# Patient Record
Sex: Female | Born: 1985
Health system: Southern US, Community
[De-identification: ages and names within clinical notes are randomized; demographics above are authoritative.]

## PROBLEM LIST (undated history)

## (undated) DIAGNOSIS — G8929 Other chronic pain: Secondary | ICD-10-CM

## (undated) DIAGNOSIS — J069 Acute upper respiratory infection, unspecified: Secondary | ICD-10-CM

## (undated) DIAGNOSIS — Z111 Encounter for screening for respiratory tuberculosis: Secondary | ICD-10-CM

## (undated) DIAGNOSIS — K7689 Other specified diseases of liver: Secondary | ICD-10-CM

## (undated) DIAGNOSIS — F4320 Adjustment disorder, unspecified: Secondary | ICD-10-CM

## (undated) DIAGNOSIS — M549 Dorsalgia, unspecified: Secondary | ICD-10-CM

## (undated) DIAGNOSIS — Z8669 Personal history of other diseases of the nervous system and sense organs: Secondary | ICD-10-CM

## (undated) DIAGNOSIS — F339 Major depressive disorder, recurrent, unspecified: Secondary | ICD-10-CM

## (undated) DIAGNOSIS — K122 Cellulitis and abscess of mouth: Secondary | ICD-10-CM

## (undated) DIAGNOSIS — R220 Localized swelling, mass and lump, head: Secondary | ICD-10-CM

## (undated) DIAGNOSIS — L709 Acne, unspecified: Secondary | ICD-10-CM

## (undated) DIAGNOSIS — R221 Localized swelling, mass and lump, neck: Secondary | ICD-10-CM

## (undated) DIAGNOSIS — F419 Anxiety disorder, unspecified: Secondary | ICD-10-CM

## (undated) DIAGNOSIS — F902 Attention-deficit hyperactivity disorder, combined type: Secondary | ICD-10-CM

## (undated) DIAGNOSIS — K219 Gastro-esophageal reflux disease without esophagitis: Secondary | ICD-10-CM

## (undated) DIAGNOSIS — H579 Unspecified disorder of eye and adnexa: Secondary | ICD-10-CM

## (undated) DIAGNOSIS — K589 Irritable bowel syndrome without diarrhea: Secondary | ICD-10-CM

## (undated) DIAGNOSIS — J189 Pneumonia, unspecified organism: Secondary | ICD-10-CM

## (undated) DIAGNOSIS — M359 Systemic involvement of connective tissue, unspecified: Secondary | ICD-10-CM

## (undated) HISTORY — DX: Localized swelling, mass and lump, neck: R22.1

## (undated) HISTORY — DX: Unspecified disorder of eye and adnexa: H57.9

## (undated) HISTORY — DX: Other chronic pain: G89.29

## (undated) HISTORY — DX: Major depressive disorder, recurrent, unspecified: F33.9

## (undated) HISTORY — DX: Personal history of other diseases of the nervous system and sense organs: Z86.69

## (undated) HISTORY — DX: Adjustment disorder, unspecified: F43.20

## (undated) HISTORY — DX: Systemic involvement of connective tissue, unspecified: M35.9

## (undated) HISTORY — DX: Localized swelling, mass and lump, head: R22.0

## (undated) HISTORY — DX: Acute upper respiratory infection, unspecified: J06.9

## (undated) HISTORY — DX: Gastro-esophageal reflux disease without esophagitis: K21.9

## (undated) HISTORY — PX: FOOT SURGERY: SHX648

## (undated) HISTORY — PX: TYMPANOSTOMY TUBE PLACEMENT: SHX32

## (undated) HISTORY — DX: Acne, unspecified: L70.9

## (undated) HISTORY — DX: Encounter for screening for respiratory tuberculosis: Z11.1

## (undated) HISTORY — DX: Irritable bowel syndrome, unspecified: K58.9

## (undated) HISTORY — DX: Dorsalgia, unspecified: M54.9

## (undated) HISTORY — DX: Attention-deficit hyperactivity disorder, combined type: F90.2

## (undated) HISTORY — PX: GANGLION CYST EXCISION: SHX1691

## (undated) HISTORY — PX: MYRINGOTOMY: SUR874

## (undated) HISTORY — PX: SINOSCOPY: SHX187

---
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
## 2006-01-11 ENCOUNTER — Ambulatory Visit: Payer: Self-pay | Admitting: Family Medicine

## 2006-04-22 ENCOUNTER — Emergency Department (HOSPITAL_COMMUNITY): Admission: EM | Admit: 2006-04-22 | Discharge: 2006-04-22 | Payer: Self-pay | Admitting: Family Medicine

## 2006-06-01 ENCOUNTER — Ambulatory Visit: Payer: Self-pay | Admitting: Family Medicine

## 2006-07-20 ENCOUNTER — Ambulatory Visit: Payer: Self-pay | Admitting: Family Medicine

## 2006-07-20 LAB — CONVERTED CEMR LAB: Mono Screen: NEGATIVE

## 2006-08-14 ENCOUNTER — Ambulatory Visit: Payer: Self-pay

## 2006-08-14 ENCOUNTER — Encounter: Payer: Self-pay | Admitting: Cardiology

## 2006-08-14 ENCOUNTER — Ambulatory Visit: Payer: Self-pay | Admitting: Internal Medicine

## 2006-08-28 ENCOUNTER — Ambulatory Visit: Payer: Self-pay | Admitting: Family Medicine

## 2006-09-21 ENCOUNTER — Ambulatory Visit: Payer: Self-pay | Admitting: Family Medicine

## 2007-01-16 ENCOUNTER — Ambulatory Visit: Payer: Self-pay | Admitting: Family Medicine

## 2007-04-19 ENCOUNTER — Emergency Department (HOSPITAL_COMMUNITY): Admission: EM | Admit: 2007-04-19 | Discharge: 2007-04-20 | Payer: Self-pay | Admitting: Emergency Medicine

## 2007-06-20 ENCOUNTER — Ambulatory Visit: Payer: Self-pay | Admitting: Family Medicine

## 2007-06-20 LAB — CONVERTED CEMR LAB: Rapid Strep: NEGATIVE

## 2007-06-21 LAB — CONVERTED CEMR LAB
Basophils Absolute: 0 10*3/uL (ref 0.0–0.1)
Basophils Relative: 0 % (ref 0.0–1.0)
Eosinophils Absolute: 0.1 10*3/uL (ref 0.0–0.6)
Eosinophils Relative: 0.7 % (ref 0.0–5.0)
HCT: 37.2 % (ref 36.0–46.0)
Hemoglobin: 13.1 g/dL (ref 12.0–15.0)
Lymphocytes Relative: 14.4 % (ref 12.0–46.0)
MCHC: 35.1 g/dL (ref 30.0–36.0)
MCV: 87.9 fL (ref 78.0–100.0)
Mono Screen: NEGATIVE
Monocytes Absolute: 0.9 10*3/uL — ABNORMAL HIGH (ref 0.2–0.7)
Monocytes Relative: 7.2 % (ref 3.0–11.0)
Neutro Abs: 9.7 10*3/uL — ABNORMAL HIGH (ref 1.4–7.7)
Neutrophils Relative %: 77.7 % — ABNORMAL HIGH (ref 43.0–77.0)
Platelets: 213 10*3/uL (ref 150–400)
RBC: 4.23 M/uL (ref 3.87–5.11)
RDW: 11.9 % (ref 11.5–14.6)
WBC: 12.5 10*3/uL — ABNORMAL HIGH (ref 4.5–10.5)

## 2007-07-06 ENCOUNTER — Ambulatory Visit: Payer: Self-pay | Admitting: Internal Medicine

## 2007-07-06 ENCOUNTER — Telehealth (INDEPENDENT_AMBULATORY_CARE_PROVIDER_SITE_OTHER): Payer: Self-pay | Admitting: Internal Medicine

## 2007-09-26 ENCOUNTER — Ambulatory Visit: Payer: Self-pay | Admitting: Internal Medicine

## 2007-10-03 DIAGNOSIS — F909 Attention-deficit hyperactivity disorder, unspecified type: Secondary | ICD-10-CM | POA: Insufficient documentation

## 2007-10-03 DIAGNOSIS — Z87898 Personal history of other specified conditions: Secondary | ICD-10-CM | POA: Insufficient documentation

## 2008-01-03 ENCOUNTER — Ambulatory Visit: Payer: Self-pay | Admitting: Internal Medicine

## 2008-01-03 LAB — CONVERTED CEMR LAB
Basophils Relative: 0.8 % (ref 0.0–3.0)
Eosinophils Absolute: 0.1 10*3/uL (ref 0.0–0.7)
Eosinophils Relative: 0.7 % (ref 0.0–5.0)
HCT: 40.5 % (ref 36.0–46.0)
Hemoglobin: 14.1 g/dL (ref 12.0–15.0)
Lymphocytes Relative: 21.1 % (ref 12.0–46.0)
MCHC: 34.8 g/dL (ref 30.0–36.0)
MCV: 90.7 fL (ref 78.0–100.0)
Monocytes Absolute: 0.4 10*3/uL (ref 0.1–1.0)
Monocytes Relative: 5.4 % (ref 3.0–12.0)
Neutro Abs: 5.6 10*3/uL (ref 1.4–7.7)
Neutrophils Relative %: 72 % (ref 43.0–77.0)
Platelets: 257 10*3/uL (ref 150–400)
RBC: 4.47 M/uL (ref 3.87–5.11)
RDW: 12 % (ref 11.5–14.6)
WBC: 7.8 10*3/uL (ref 4.5–10.5)

## 2008-01-14 ENCOUNTER — Ambulatory Visit: Payer: Self-pay

## 2008-01-14 ENCOUNTER — Encounter: Payer: Self-pay | Admitting: Internal Medicine

## 2008-03-21 ENCOUNTER — Ambulatory Visit: Payer: Self-pay | Admitting: Family Medicine

## 2008-05-02 ENCOUNTER — Ambulatory Visit: Payer: Self-pay | Admitting: Internal Medicine

## 2008-05-14 ENCOUNTER — Ambulatory Visit (HOSPITAL_COMMUNITY): Admission: RE | Admit: 2008-05-14 | Discharge: 2008-05-14 | Payer: Self-pay | Admitting: Internal Medicine

## 2008-07-04 ENCOUNTER — Ambulatory Visit: Payer: Self-pay | Admitting: Family Medicine

## 2008-09-05 ENCOUNTER — Encounter: Admission: RE | Admit: 2008-09-05 | Discharge: 2008-09-05 | Payer: Self-pay | Admitting: Family Medicine

## 2008-09-05 ENCOUNTER — Ambulatory Visit: Payer: Self-pay | Admitting: Internal Medicine

## 2008-09-05 DIAGNOSIS — R221 Localized swelling, mass and lump, neck: Secondary | ICD-10-CM

## 2008-09-05 DIAGNOSIS — R220 Localized swelling, mass and lump, head: Secondary | ICD-10-CM | POA: Insufficient documentation

## 2008-09-07 ENCOUNTER — Encounter: Admission: RE | Admit: 2008-09-07 | Discharge: 2008-09-07 | Payer: Self-pay | Admitting: Family Medicine

## 2008-09-09 ENCOUNTER — Telehealth: Payer: Self-pay | Admitting: Family Medicine

## 2008-09-09 LAB — CONVERTED CEMR LAB
BUN: 10 mg/dL (ref 6–23)
Basophils Absolute: 0.1 10*3/uL (ref 0.0–0.1)
Basophils Relative: 0.7 % (ref 0.0–3.0)
CO2: 27 meq/L (ref 19–32)
Calcium: 9.7 mg/dL (ref 8.4–10.5)
Chloride: 107 meq/L (ref 96–112)
Creatinine, Ser: 0.9 mg/dL (ref 0.4–1.2)
Eosinophils Absolute: 0.1 10*3/uL (ref 0.0–0.7)
Eosinophils Relative: 2 % (ref 0.0–5.0)
GFR calc non Af Amer: 83.16 mL/min (ref >60)
Glucose, Bld: 87 mg/dL (ref 70–99)
HCT: 41.7 % (ref 36.0–46.0)
Hemoglobin: 14.7 g/dL (ref 12.0–15.0)
Lymphocytes Relative: 24.5 % (ref 12.0–46.0)
Lymphs Abs: 1.8 10*3/uL (ref 0.7–4.0)
MCHC: 35.1 g/dL (ref 30.0–36.0)
MCV: 87.3 fL (ref 78.0–100.0)
Monocytes Absolute: 0.4 10*3/uL (ref 0.1–1.0)
Monocytes Relative: 5.6 % (ref 3.0–12.0)
Neutro Abs: 4.9 10*3/uL (ref 1.4–7.7)
Neutrophils Relative %: 67.2 % (ref 43.0–77.0)
Platelets: 234 10*3/uL (ref 150.0–400.0)
Potassium: 4.1 meq/L (ref 3.5–5.1)
RBC: 4.78 M/uL (ref 3.87–5.11)
RDW: 12.1 % (ref 11.5–14.6)
WBC: 7.3 10*3/uL (ref 4.5–10.5)

## 2009-03-18 ENCOUNTER — Encounter (INDEPENDENT_AMBULATORY_CARE_PROVIDER_SITE_OTHER): Payer: Self-pay | Admitting: *Deleted

## 2009-04-06 ENCOUNTER — Emergency Department (HOSPITAL_COMMUNITY): Admission: EM | Admit: 2009-04-06 | Discharge: 2009-04-06 | Payer: Self-pay | Admitting: Family Medicine

## 2009-05-08 ENCOUNTER — Ambulatory Visit: Payer: Self-pay | Admitting: Internal Medicine

## 2009-05-08 ENCOUNTER — Encounter (INDEPENDENT_AMBULATORY_CARE_PROVIDER_SITE_OTHER): Payer: Self-pay | Admitting: *Deleted

## 2009-05-08 DIAGNOSIS — R42 Dizziness and giddiness: Secondary | ICD-10-CM | POA: Insufficient documentation

## 2009-05-08 DIAGNOSIS — K589 Irritable bowel syndrome without diarrhea: Secondary | ICD-10-CM | POA: Insufficient documentation

## 2009-06-20 ENCOUNTER — Emergency Department (HOSPITAL_COMMUNITY): Admission: EM | Admit: 2009-06-20 | Discharge: 2009-06-20 | Payer: Self-pay | Admitting: Family Medicine

## 2009-06-20 ENCOUNTER — Ambulatory Visit (HOSPITAL_COMMUNITY): Admission: EM | Admit: 2009-06-20 | Discharge: 2009-06-20 | Payer: Self-pay | Admitting: Orthopaedic Surgery

## 2009-09-02 ENCOUNTER — Ambulatory Visit: Payer: Self-pay | Admitting: Family Medicine

## 2009-09-09 ENCOUNTER — Emergency Department (HOSPITAL_COMMUNITY): Admission: EM | Admit: 2009-09-09 | Discharge: 2009-09-09 | Payer: Self-pay | Admitting: Family Medicine

## 2009-09-09 ENCOUNTER — Telehealth: Payer: Self-pay | Admitting: Family Medicine

## 2009-09-14 ENCOUNTER — Encounter: Payer: Self-pay | Admitting: Family Medicine

## 2009-09-15 ENCOUNTER — Encounter: Admission: RE | Admit: 2009-09-15 | Discharge: 2009-09-15 | Payer: Self-pay | Admitting: General Surgery

## 2009-09-15 ENCOUNTER — Encounter (INDEPENDENT_AMBULATORY_CARE_PROVIDER_SITE_OTHER): Payer: Self-pay | Admitting: *Deleted

## 2009-12-07 ENCOUNTER — Telehealth (INDEPENDENT_AMBULATORY_CARE_PROVIDER_SITE_OTHER): Payer: Self-pay | Admitting: *Deleted

## 2010-02-08 ENCOUNTER — Emergency Department (HOSPITAL_COMMUNITY): Admission: EM | Admit: 2010-02-08 | Discharge: 2010-02-08 | Payer: Self-pay | Admitting: Emergency Medicine

## 2010-05-11 ENCOUNTER — Encounter: Payer: Self-pay | Admitting: Family Medicine

## 2010-05-12 ENCOUNTER — Ambulatory Visit: Payer: Self-pay | Admitting: Internal Medicine

## 2010-05-12 DIAGNOSIS — F331 Major depressive disorder, recurrent, moderate: Secondary | ICD-10-CM | POA: Insufficient documentation

## 2010-05-12 DIAGNOSIS — F329 Major depressive disorder, single episode, unspecified: Secondary | ICD-10-CM

## 2010-07-01 ENCOUNTER — Encounter (INDEPENDENT_AMBULATORY_CARE_PROVIDER_SITE_OTHER): Payer: Self-pay | Admitting: *Deleted

## 2010-07-01 NOTE — Letter (Signed)
Summary: [facility] Note,[facility]  [facility] Note,[facility]   Imported By: Beau Fanny 05/13/2010 09:48:07  _____________________________________________________________________  External Attachment:    Type:   Image     Comment:   External Document

## 2010-07-01 NOTE — Assessment & Plan Note (Signed)
Summary: NECK SWOLLEN/DLO   Vital Signs:  Patient profile:   26 year old female Height:      69 inches Weight:      191.13 pounds BMI:     28.33 Temp:     98.2 degrees F oral Pulse rate:   92 / minute Pulse rhythm:   regular BP sitting:   106 / 70  (left arm) Cuff size:   regular  Vitals Entered By: Delilah Shan CMA Duncan Dull) (September 02, 2009 4:03 PM) CC: Neck swollen   History of Present Illness: 25 yo female with h/o marfans new to me here for follow up of lesion on posterior neck.  Was seen exactly 1 year ago for same lesion, MRI did not show much other ? lipoma. Was referred to surgery but according to pt we called her to tell her to cancel it due to benign findings on MRI.  Since then, it has continued to grow.  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  She denies any radicular symptoms in her upper arms or upper arm weakness.  No other focal neurological symptoms either. Has had more occipital headaches lately as well.  No systemic symptoms such as fevers, night sweats, or weight loss.  MRI from 08/2008 reviewed.    Current Medications (verified): 1)  Alprazolam 1 Mg  Tbdp (Alprazolam) .... Prn 2)  Doxycycline Hyclate 100 Mg Caps (Doxycycline Hyclate) .... Take 1 Capsule By Mouth Two Times A Day  Allergies: 1)  Augmentin 2)  Neomycin  Review of Systems      See HPI General:  Denies chills, fatigue, fever, loss of appetite, and sweats. Neuro:  Complains of headaches; denies numbness, tremors, and weakness.  Physical Exam  General:  Well developed, well nourished, in no acute distress. Neck:  JVP is normal  Palpable soft mass with neck flexion, non tender, soft. Msk:  no acute joint changes Neurologic:  Alert and oriented x 3. Psych:  normally interactive and slightly anxious.     Impression & Recommendations:  Problem # 1:  SWELLING MASS OR LUMP IN HEAD AND NECK  (ICD-784.2) Assessment Deteriorated Will refer to surgery.  She does not want to repeat MRI yet, which I can understand, due to radiation that she is exposed due for work up of her marfan's (cardiac, etc). She did agree to MRI if surgery recommends it. Orders: Surgical Referral (Surgery)  Complete Medication List: 1)  Alprazolam 1 Mg Tbdp (Alprazolam) .... Prn 2)  Doxycycline Hyclate 100 Mg Caps (Doxycycline hyclate) .... Take 1 capsule by mouth two times a day  Patient Instructions: 1)  Please stop by to see Shirlee Limerick on your way out.  Current Allergies (reviewed today): AUGMENTIN NEOMYCIN

## 2010-07-01 NOTE — Progress Notes (Signed)
  Phone Note Call from Patient   Caller: Patient Call For: Judith Part MD Summary of Call: Patient called wanting to be seen this afternoon. I advised her that we had nothing left for the afternoon, but offered her [facility] and she was okay with that.  Initial call taken by: Melody Comas,  December 07, 2009 3:23 PM

## 2010-07-01 NOTE — Assessment & Plan Note (Signed)
Summary: TRANSFER FROM [facility]ILLIE/   Vital Signs:  Patient profile:   25 year old female Weight:      192.25 pounds Temp:     98.5 degrees F oral Pulse rate:   76 / minute Pulse rhythm:   regular BP sitting:   116 / 64  (left arm) Cuff size:   large  Vitals Entered By: Selena Batten Dance CMA Duncan Dull) (May 12, 2010 11:26 AM) CC: Anxiety/stress issues   History of Present Illness: CC: depression, stress.  lots of loss recently - 11yo brother passed away from desmoplastic small round cell cancer - rare cousin to Ewings.  boyfriend broke up with her last week.  mother unstable from recent loss of son.  grandfather died prior.  poor social support - has friends but they all have their own lives.  scehduled to see hospice to talk about grievance counseling Friday.  Then back to counselor next week.  Depression - trouble concentrating/focusing.  + anhedonia.  Increased sleep.  Decreasd energy.  Appetite down.  No HI.  + SI - no plan.  Doesn't think would go through with it 2/2 pain she would cause family.  feels guilty over being the one who awoke mother to tell her that son had passed.  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    recently completed nursing school.  Working at [employer].  [facility] after grandfather died.  previous dx ADHD on vyvanse, off med since finished nursing school.  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 Chest pain Dizziness Depression/Adjustment disorder  Past Surgical History: Ear tubes Left wrist ganglion cyst frenulectomy 25yo  Family History: Father: ETOH abuse,spinal problem Mother: migraines Grandfather: CAD/MI 5v bypass PGP: COPD MAunt: BRCA x2 Siblings: 2 half brothers- one had kidney  removed age 49 month, died of CA               1 half sister  No DM, CVA, colon CA  Social History: Marital Status: single Children: none No tobacco, occasional EtOH, rec drugs Caffeine: 1 coke occasionally occupation: Works at [employer] alone, 1 dog  Review of Systems       per HPI  Physical Exam  General:  Well-developed,well-nourished,in no acute distress; alert,appropriate and cooperative throughout examination Psych:  apporpirate, blunted affect, tears up with discussion of recent stressors/loss.   Impression & Recommendations:  Problem # 1:  DEPRESSION (ICD-311) adjustment disorder with episode of major depression in setting of grieving from recent losses.  received records from counselor Madison Hickman.  Has had poor results with 2 SSRIs (celexa and zoloft) and SNRI (effexor).  + passing SI but no plan, doesn't think would go through.  Contracts for safety.  Will start wellbutrin.  Discussed possible initial worsening of sxs prior to improvement.  If that happens, to call us.  A total of 30 minutes was spent face-to-face with the patient during this encounter and over half of that time was spent on counseling and coordination of care   Her updated medication list for this problem includes:    Alprazolam 1 Mg Tbdp (Alprazolam) ..... One daily as needed for anxiety    Wellbutrin Xl 150 Mg Xr24h-tab (Bupropion hcl) ..... One daily for depression  x 1 wk then increase to 2 daily  Complete Medication List: 1)  Alprazolam 1 Mg Tbdp (Alprazolam) .... One daily as needed for anxiety 2)  Wellbutrin Xl 150 Mg Xr24h-tab (Bupropion hcl) .... One daily for depression x 1 wk then increase to 2 daily  Patient Instructions: 1)  RTC 1 mo for f/u 2)  For mood - start wellbutrin once daily for 1 wk then increase to 2 daily.  SI precautions, call if not doing well on this. 3)  Keep appt with hospice Friday 4)  Keep appt with Bjorn Loser next  week. Prescriptions: WELLBUTRIN XL 150 MG XR24H-TAB (BUPROPION HCL) one daily for depression x 1 wk then increase to 2 daily  #60 x 0   Entered and Authorized by:   Eustaquio Boyden  MD   Signed by:   Eustaquio Boyden  MD on 05/12/2010   Method used:   Electronically to        [facility]* (retail)       [address]       [place], [place]  [address]       Ph: [phone]       Fax: [phone]   RxID:   [id number] ALPRAZOLAM 1 MG  TBDP (ALPRAZOLAM) one daily as needed for anxiety  #30 x 0   Entered and Authorized by:   Eustaquio Boyden  MD   Signed by:   Eustaquio Boyden  MD on 05/12/2010   Method used:   Print then Give to Patient   RxID:   [id number]    Orders Added: 1)  Est. Patient Level IV [64[address]]    Current Allergies (reviewed today): AUGMENTIN * NEOMYCIN OINTMENT DOXYCYCLINE  Appended Document: TRANSFER FROM [facility]ILLIE/ please fax to [facility], attn Lovenia Shuck at [phone]. (ph # [phone])  Appended Document: TRANSFER FROM BILLIE/ Faxed as directed

## 2010-07-01 NOTE — Progress Notes (Signed)
Summary: feels sick, headache  Phone Note Call from Patient Call back at Home Phone [phone]   Caller: Patient Call For: Judith Part MD Summary of Call: Patient sees surgeon for the tumor on her neck monday. She says that last night started feeling terrible with the worst headache she has ever had and feeling very nauseated.  She said that sher is in terrible pain and her neck is swollen. Symptoms have not improved today She wants to know what she should do. Please advise. [facility]. Initial call taken by: Melody Comas,  September 09, 2009 3:48 PM  Follow-up for Phone Call        If she is having all of those symptoms, she needs to be evaluated in the ED.  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 Follow-up by: Ruthe Mannan MD,  September 09, 2009 3:58 PM  Additional Follow-up for Phone Call Additional follow up Details #1::        Called patient back, she said she was on her way to Urgent care.

## 2010-07-01 NOTE — Consult Note (Signed)
Summary: [facility]   Imported By: Lanelle Bal 09/29/2009 13:00:04  _____________________________________________________________________  External Attachment:    Type:   Image     Comment:   External Document

## 2010-07-07 NOTE — Letter (Signed)
Summary: New Patient letter  [facility]  [address] [place], [place] [address]   Phone: [phone]  Fax: [phone]       07/01/2010 MRN: [id number]  [facility] [address] [place], [place]  [address]  Dear Heather Stewart,  Welcome to the Gastroenterology Division at [facility].    You are scheduled to see Dr. Claudette Head on 07-08-2010 at 9:30am on the 3rd floor at [facility], [address]  We ask that you try to arrive at our office 15 minutes prior to your appointment time to allow for check-in.  We would like you to complete the enclosed self-administered evaluation form prior to your visit and bring it with you on the day of your appointment.  We will review it with you.  Also, please bring a complete list of all your medications or, if you prefer, bring the medication bottles and we will list them.  Please bring your insurance card so that we may make a copy of it.  If your insurance requires a referral to see a specialist, please bring your referral form from your primary care physician.  Co-payments are due at the time of your visit and may be paid by cash, check or credit card.     Your office visit will consist of a consult with your physician (includes a physical exam), any laboratory testing he/she may order, scheduling of any necessary diagnostic testing (e.g. x-ray, ultrasound, CT-scan), and scheduling of a procedure (e.g. Endoscopy, Colonoscopy) if required.  Please allow enough time on your schedule to allow for any/all of these possibilities.    If you cannot keep your appointment, please call [phone] to cancel or reschedule prior to your appointment date.  This allows [place] the opportunity to schedule an appointment for another patient in need of care.  If you do not cancel or reschedule by 5 p.m. the business day prior to your appointment date, you will be charged a $50.00 late cancellation/no-show fee.    Thank you for choosing  [facility] for your medical needs.  We appreciate the opportunity to care for you.  Please visit [place] at our website  to learn more about our practice.                     Sincerely,                                                             The Gastroenterology Division

## 2010-07-08 ENCOUNTER — Encounter: Payer: Self-pay | Admitting: Gastroenterology

## 2010-07-08 ENCOUNTER — Ambulatory Visit (INDEPENDENT_AMBULATORY_CARE_PROVIDER_SITE_OTHER): Payer: Managed Care, Other (non HMO) | Admitting: Gastroenterology

## 2010-07-08 ENCOUNTER — Encounter (INDEPENDENT_AMBULATORY_CARE_PROVIDER_SITE_OTHER): Payer: Self-pay | Admitting: *Deleted

## 2010-07-08 ENCOUNTER — Telehealth: Payer: Self-pay | Admitting: Gastroenterology

## 2010-07-08 ENCOUNTER — Other Ambulatory Visit: Payer: Self-pay | Admitting: Gastroenterology

## 2010-07-08 ENCOUNTER — Other Ambulatory Visit: Payer: Managed Care, Other (non HMO)

## 2010-07-08 DIAGNOSIS — K219 Gastro-esophageal reflux disease without esophagitis: Secondary | ICD-10-CM

## 2010-07-08 DIAGNOSIS — R109 Unspecified abdominal pain: Secondary | ICD-10-CM

## 2010-07-08 DIAGNOSIS — IMO0001 Reserved for inherently not codable concepts without codable children: Secondary | ICD-10-CM

## 2010-07-08 DIAGNOSIS — R1013 Epigastric pain: Secondary | ICD-10-CM

## 2010-07-08 LAB — CBC WITH DIFFERENTIAL/PLATELET
Basophils Absolute: 0 10*3/uL (ref 0.0–0.1)
Eosinophils Absolute: 0.1 10*3/uL (ref 0.0–0.7)
Eosinophils Relative: 1.1 % (ref 0.0–5.0)
HCT: 41.4 % (ref 36.0–46.0)
Lymphocytes Relative: 26.4 % (ref 12.0–46.0)
Lymphs Abs: 1.8 10*3/uL (ref 0.7–4.0)
MCHC: 34.3 g/dL (ref 30.0–36.0)
Monocytes Absolute: 0.4 10*3/uL (ref 0.1–1.0)
Monocytes Relative: 6.1 % (ref 3.0–12.0)
Neutro Abs: 4.5 10*3/uL (ref 1.4–7.7)

## 2010-07-08 LAB — BASIC METABOLIC PANEL
Calcium: 9 mg/dL (ref 8.4–10.5)
Chloride: 104 mEq/L (ref 96–112)
Creatinine, Ser: 0.8 mg/dL (ref 0.4–1.2)
GFR: 100.98 mL/min (ref >60.00)
Potassium: 4.2 mEq/L (ref 3.5–5.1)

## 2010-07-08 LAB — TSH: TSH: 2.27 u[IU]/mL (ref 0.35–5.50)

## 2010-07-08 LAB — HEPATIC FUNCTION PANEL
AST: 15 U/L (ref 0–37)
Albumin: 3.9 g/dL (ref 3.5–5.2)
Alkaline Phosphatase: 55 U/L (ref 39–117)
Total Bilirubin: 0.3 mg/dL (ref 0.3–1.2)

## 2010-07-08 LAB — LIPASE: Lipase: 29 U/L (ref 11.0–59.0)

## 2010-07-09 ENCOUNTER — Encounter: Payer: Self-pay | Admitting: Gastroenterology

## 2010-07-09 ENCOUNTER — Telehealth: Payer: Self-pay | Admitting: Gastroenterology

## 2010-07-12 ENCOUNTER — Other Ambulatory Visit: Payer: Self-pay | Admitting: Gastroenterology

## 2010-07-12 ENCOUNTER — Other Ambulatory Visit (AMBULATORY_SURGERY_CENTER): Payer: Managed Care, Other (non HMO) | Admitting: Gastroenterology

## 2010-07-12 DIAGNOSIS — K219 Gastro-esophageal reflux disease without esophagitis: Secondary | ICD-10-CM

## 2010-07-12 DIAGNOSIS — K2990 Gastroduodenitis, unspecified, without bleeding: Secondary | ICD-10-CM

## 2010-07-12 DIAGNOSIS — R1013 Epigastric pain: Secondary | ICD-10-CM

## 2010-07-12 DIAGNOSIS — K2970 Gastritis, unspecified, without bleeding: Secondary | ICD-10-CM

## 2010-07-12 DIAGNOSIS — K319 Disease of stomach and duodenum, unspecified: Secondary | ICD-10-CM

## 2010-07-15 ENCOUNTER — Encounter: Payer: Self-pay | Admitting: Gastroenterology

## 2010-07-15 NOTE — Assessment & Plan Note (Signed)
Summary: nausea, sweats, stabbing pain in stomach...sch w pt cigna-bcb...   History of Present Illness Visit Type: Initial Consult Primary GI MD: Elie Goody MD [facility] Primary Provider: Eustaquio Boyden  MD Requesting Provider: n/a Chief Complaint: Patient c/o 1.5 years occasional sharp epigastric pains with nausea and vomiting. Patient also c/o esophageal burning with these episodes. History of Present Illness:   This is a 25 year old female, who relates a 1.5 year history of intermittent, sharp, severe cramp-like epigastric pains, which are generally associated with nausea and rarely associated with vomiting. Her symptoms have increased in frequency and severity over the past few months and now occur about every 2 weeks. They tend to last for several hours and she is asymptomatic in between attacks. She had an abdominal ultrasound performed in April 2011 that showed fatty infiltration of the liver. She was evaluated by Dr. Bertram Savin in April 2011. She has frequent reflux symptoms occurring once or twice a week. She said under significant stress for the past several months following the death of her 37 year old brother.  She relates mild constipation alternating with normal bowel movements for several years and this pattern has not changed   GI Review of Systems    Reports abdominal pain, acid reflux, bloating, nausea, and  vomiting.     Location of  Abdominal pain: epigastric area.    Denies belching, chest pain, dysphagia with liquids, dysphagia with solids, heartburn, loss of appetite, vomiting blood, weight loss, and  weight gain.      Reports change in bowel habits and  constipation.     Denies anal fissure, black tarry stools, diarrhea, diverticulosis, fecal incontinence, heme positive stool, hemorrhoids, irritable bowel syndrome, jaundice, light color stool, liver problems, rectal bleeding, and  rectal pain. Preventive Screening-Counseling & Management  Alcohol-Tobacco  Smoking Status: never   Current Medications (verified): 1)  Alprazolam 1 Mg  Tbdp (Alprazolam) .... One Daily As Needed For Anxiety  Allergies: 1)  ! * Polymycin 2)  ! * Bacitracin 3)  Augmentin 4)  * Neomycin Ointment 5)  Doxycycline  Past History:  Past Medical History: Reviewed history from 05/12/2010 and no changes required. 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 Chest pain Dizziness Depression/Adjustment disorder  Past Surgical History: Ear tubes Left wrist ganglion cyst frenulectomy 25yo foot surgery-right (glass removed in o.r.)  Family History: Father: ETOH abuse,spinal problem Mother: migraines Grandfather: CAD/MI 5v bypass PGP: COPD MAunt: BRCA x2 Siblings: 2 half brothers- one had kidney removed age 82 month, died of CA               1 half sister No DM, CVA, colon CA Family History of Breast Cancer: Aunts x 3 No FH of Colon Cancer: Family History of Heart Disease: MGF, PGF, Great GM Family History of Kidney Disease: Great Grandmother Family History of Liver Cancer: Brother  Social History: Marital Status: single Children: none occasional EtOH, -4 drinks per week NO rec drugs Caffeine: 1 coke occasionally occupation: Works at [employer] alone, 1 dog Patient has never smoked.   Review of Systems       The patient complains of back pain and depression-new.         The pertinent positives and negatives are noted as above and in the HPI. All other ROS were reviewed and were negative.   Vital Signs:  Patient profile:   25 year old female Height:      69 inches Weight:  189.56 pounds BMI:     28.09 BSA:     2.02 Pulse rate:   60 / minute Pulse rhythm:   regular BP sitting:   110 / 64  (left arm)  Vitals Entered By: Lamona Curl CMA Duncan Dull) (July 08, 2010 9:35 AM)  Physical Exam  General:  Well developed, well nourished, no acute distress. Head:  Normocephalic and atraumatic. Eyes:   PERRLA, no icterus. Ears:  Normal auditory acuity. Mouth:  No deformity or lesions, dentition normal. Lungs:  Clear throughout to auscultation. Heart:  Regular rate and rhythm; no murmurs, rubs,  or bruits. Abdomen:  Soft, nontender and nondistended. No masses, hepatosplenomegaly or hernias noted. Normal bowel sounds. Pulses:  Normal pulses noted. Extremities:  No clubbing, cyanosis, edema or deformities noted. Neurologic:  Alert and  oriented x4;  grossly normal neurologically. Cervical Nodes:  No significant cervical adenopathy. Inguinal Nodes:  No significant inguinal adenopathy. Psych:  Alert and cooperative. Normal mood and affect.  Impression & Recommendations:  Problem # 1:  ABDOMINAL PAIN, EPIGASTRIC (ICD-789.06) Intermittent epigastric pain. Rule out acalculous cholecystitis, GERD, and ulcer disease. Begin omeprazole 40 mg daily, and standard antireflux measures. Schedule CCK HIDA. Schedule EGD. The risks, benefits and alternatives to endoscopy with possible biopsy and possible dilation were discussed with the patient and they consent to proceed. The procedure will be scheduled electively. Orders: TLB-BMP (Basic Metabolic Panel-BMET) (80048-METABOL) TLB-Hepatic/Liver Function Pnl (80076-HEPATIC) TLB-CBC Platelet - w/Differential (85025-CBCD) TLB-TSH (Thyroid Stimulating Hormone) (84443-TSH) TLB-Lipase (83690-LIPASE)  Problem # 2:  GERD (ICD-530.81) Plans as above.  Patient Instructions: 1)  Please go directly to the basement to have your labs drawn.  2)  You have been scheduled for a [facility]. 3)  Upper Endoscopy brochure given.  4)  Pick up your prescription from your pharmacy.  5)  Avoid foods high in acid content ( tomatoes, citrus juices, spicy foods) . Avoid eating within 3 to 4 hours of lying down or before exercising. Do not over eat; try smaller more frequent meals. Elevate head of bed four inches when sleeping.  6)  Copy sent to : Eustaquio Boyden  MD 7)  The  medication list was reviewed and reconciled.  All changed / newly prescribed medications were explained.  A complete medication list was provided to the patient / caregiver.  Prescriptions: OMEPRAZOLE 40 MG CPDR (OMEPRAZOLE) one tablet by mouth once daily  #30 x 11   Entered by:   Christie Nottingham CMA (AAMA)   Authorized by:   Meryl Dare MD [facility]   Signed by:   Christie Nottingham CMA (AAMA) on 07/08/2010   Method used:   Electronically to        [facility]* (retail)       [address]       [place], [place]  [address]       Ph: [phone]       Fax: [phone]   RxID:   [id number]   Appended Document: nausea, sweats, stabbing pain in stomach...sch w pt cigna-bcb... Called [facility] to change the Rx to [facility] [address] per pt's request.    Clinical Lists Changes  Medications: Rx of OMEPRAZOLE 40 MG CPDR (OMEPRAZOLE) one tablet by mouth once daily;  #30 x 11;  Signed;  Entered by: Christie Nottingham CMA (AAMA);  Authorized by: Meryl Dare MD [facility];  Method used: Electronically to [facility] [address], [place], [place], [place]  [address], Ph: [phone], Fax: [phone] Orders: Added  new Test order of EGD (EGD) - Signed Added new Referral order of HIDA CCK (HIDA CCK) - Signed    Prescriptions: OMEPRAZOLE 40 MG CPDR (OMEPRAZOLE) one tablet by mouth once daily  #30 x 11   Entered by:   Christie Nottingham CMA (AAMA)   Authorized by:   Meryl Dare MD [facility]   Signed by:   Christie Nottingham CMA (AAMA) on 07/08/2010   Method used:   Electronically to        [facility] (retail)       [address]       [place], [place]  [address]       Ph: [phone]       Fax: [phone]   RxID:   [id number]

## 2010-07-15 NOTE — Letter (Signed)
Summary: EGD Instructions  [facility]  [address] [place], [place] [address]   Phone: [phone]  Fax: [phone]       Heather Stewart    07-09-1985    MRN: [id number]       Procedure Day /Date: Monday February 13th, 2012     Arrival Time:  10:00am     Procedure Time: 11:00am     Location of Procedure:                    _ x _  Endoscopy Center (4th Floor)    PREPARATION FOR ENDOSCOPY   On 07/12/10 THE DAY OF THE PROCEDURE:  1.   No solid foods, milk or milk products are allowed after midnight the night before your procedure.  2.   Do not drink anything colored red or purple.  Avoid juices with pulp.  No orange juice.  3.  You may drink clear liquids until 9:00am, which is 2 hours before your procedure.                                                                                                CLEAR LIQUIDS INCLUDE: Water Jello Ice Popsicles Tea (sugar ok, no milk/cream) Powdered fruit flavored drinks Coffee (sugar ok, no milk/cream) Gatorade Juice: apple, white grape, white cranberry  Lemonade Clear bullion, consomm, broth Carbonated beverages (any kind) Strained chicken noodle soup Hard Candy   MEDICATION INSTRUCTIONS  Unless otherwise instructed, you should take regular prescription medications with a small sip of water as early as possible the morning of your procedure.        OTHER INSTRUCTIONS  You will need a responsible adult at least 24 years of age to accompany you and drive you home.   This person must remain in the waiting room during your procedure.  Wear loose fitting clothing that is easily removed.  Leave jewelry and other valuables at home.  However, you may wish to bring a book to read or an iPod/MP3 player to listen to music as you wait for your procedure to start.  Remove all body piercing jewelry and leave at home.  Total time from sign-in until discharge is approximately 2-3 hours.  You should go home  directly after your procedure and rest.  You can resume normal activities the day after your procedure.  The day of your procedure you should not:   Drive   Make legal decisions   Operate machinery   Drink alcohol   Return to work  You will receive specific instructions about eating, activities and medications before you leave.    The above instructions have been reviewed and explained to me by   _______________________    I fully understand and can verbalize these instructions _____________________________ Date _________

## 2010-07-15 NOTE — Progress Notes (Signed)
Summary: Hida Scan results   Phone Note Outgoing Call Call back at Home Phone [phone]   Call placed by: Christie Nottingham CMA Duncan Dull),  July 09, 2010 2:12 PM Call placed to: Patient Summary of Call: Hida Scan results recieved from [facility] at [facility] and results reviewed by Dr. Russella Dar. Pt notified of results of Hida Scan and verbalized understanding.  Initial call taken by: Christie Nottingham CMA Duncan Dull),  July 09, 2010 2:13 PM

## 2010-07-15 NOTE — Progress Notes (Signed)
Summary: HIDA Scan   Phone Note Call from Patient Call back at [phone]   Caller: Patient Call For: Dr. Russella Dar Reason for Call: Talk to Nurse Summary of Call: Pt. wants to know if she can have her HIDA scan done at [facility] [facility] [employer] [facility] [employer] where she works they have an opening tomorrow.  Initial call taken by: Karna Christmas,  July 08, 2010 3:23 PM  Follow-up for Phone Call        Pt rescheduled for 07/09/10 at 1:00pm at [facility] med center. Information given to Joey at [facility] to fax results back to and also written on order sheet. Pt given appt date and time. Order faxed to [phone]. Follow-up by: Christie Nottingham CMA Duncan Dull),  July 08, 2010 3:55 PM

## 2010-07-21 NOTE — Procedures (Signed)
Summary: Upper Endoscopy  Patient: Heather Stewart Note: All result statuses are Final unless otherwise noted.  Tests: (1) Upper Endoscopy (EGD)   EGD Upper Endoscopy       DONE     [facility]     [address]     [place], [place]  [address]           ENDOSCOPY PROCEDURE REPORT     PATIENT:  Heather, Stewart  MR#:  [id number]     BIRTHDATE:  1985-10-09, 24 yrs. old  GENDER:  female     ENDOSCOPIST:  Judie Petit T. Russella Dar, MD, [facility]     Referred by:  Eustaquio Boyden, M.D.     PROCEDURE DATE:  07/12/2010     PROCEDURE:  EGD with biopsy, 43239     ASA CLASS:  Class II     INDICATIONS:  epigastric pain, GERD     MEDICATIONS:  Fentanyl 50 mcg IV, Versed 6 mg IV     TOPICAL ANESTHETIC:  Exactacain Spray     DESCRIPTION OF PROCEDURE:   After the risks benefits and     alternatives of the procedure were thoroughly explained, informed     consent was obtained.  The LB GIF-H180 [id number] endoscope was     introduced through the mouth and advanced to the second portion of     the duodenum, without limitations.  The instrument was slowly     withdrawn as the mucosa was fully examined.     <<PROCEDUREIMAGES>>     Moderate gastritis was found in the antrum. It was erosive.     Multiple biopsies were obtained and sent to pathology. Otherwise     normal stomach. The esophagus and gastroesophageal junction were     completely normal in appearance. The duodenal bulb was normal in     appearance, as was the postbulbar duodenum. Retroflexed views     revealed no abnormalities. The scope was then withdrawn from the     patient and the procedure completed.     COMPLICATIONS:  None           ENDOSCOPIC IMPRESSION:     1) Moderate gastritis in the antrum           RECOMMENDATIONS:     1) Await pathology results     2) avoid ASA/NSAIDs     3) continue PPI           Karinne Schmader T. Russella Dar, MD, [facility]           n.     eSIGNED:   Venita Lick. Kianni Lheureux at 07/12/2010 10:58 AM           Tommi Rumps,  [id number]  Note: An exclamation mark (!) indicates a result that was not dispersed into the flowsheet. Document Creation Date: 07/12/2010 10:59 AM _______________________________________________________________________  (1) Order result status: Final Collection or observation date-time: 07/12/2010 10:54 Requested date-time:  Receipt date-time:  Reported date-time:  Referring Physician:   Ordering Physician: Claudette Head [phone]) Specimen Source:  Source: Launa Grill Order Number: [id number] Lab site:

## 2010-07-21 NOTE — Letter (Signed)
Summary: Patient [facility] Biopsy Results  [facility]  [address] [place], [place] [address]   Phone: [phone]  Fax: [phone]        July 15, 2010 MRN: [id number]    Heather Stewart [address] [place], [place]  [address]    Dear Ms. Gladwell,  I am pleased to inform you that the biopsies taken during your recent endoscopic examination did not show any evidence of cancer upon pathologic examination. The biopsies showed a reactive gastritis.  Continue with the treatment plan as outlined on the day of your      exam.  Please call us if you are having persistent problems or have questions about your condition that have not been fully answered at this time.  Sincerely,  Meryl Dare MD [facility]  This letter has been electronically signed by your physician.  Appended Document: Patient Notice-Endo Biopsy Results letter mailed

## 2010-07-23 ENCOUNTER — Inpatient Hospital Stay (HOSPITAL_COMMUNITY): Admission: RE | Admit: 2010-07-23 | Payer: Managed Care, Other (non HMO) | Source: Ambulatory Visit

## 2010-07-30 ENCOUNTER — Telehealth: Payer: Self-pay | Admitting: Family Medicine

## 2010-08-05 NOTE — Progress Notes (Signed)
Summary: refill request for alprazolam  Phone Note Refill Request Message from:  Fax from Pharmacy  Refills Requested: Medication #1:  ALPRAZOLAM 1 MG  TBDP one daily as needed for anxiety   Last Refilled: 05/12/2010 Faxed request from [facility], [phone].   Initial call taken by: Lowella Petties CMA, AAMA,  July 30, 2010 12:50 PM  Follow-up for Phone Call        please call in. Follow-up by: Eustaquio Boyden  MD,  July 30, 2010 1:16 PM  Additional Follow-up for Phone Call Additional follow up Details #1::        Rx called in as directed. Additional Follow-up by: Janee Morn CMA Duncan Dull),  July 30, 2010 3:10 PM    Prescriptions: ALPRAZOLAM 1 MG  TBDP (ALPRAZOLAM) one daily as needed for anxiety  #30 x 0   Entered and Authorized by:   Eustaquio Boyden  MD   Signed by:   Eustaquio Boyden  MD on 07/30/2010   Method used:   Telephoned to ...       [facility]       [address]       [place], [place]  [address]       Ph: [phone]       Fax: [phone]   RxID:   [id number]

## 2010-08-12 LAB — POCT URINALYSIS DIPSTICK
Bilirubin Urine: NEGATIVE
Glucose, UA: NEGATIVE mg/dL
Ketones, ur: NEGATIVE mg/dL
Nitrite: NEGATIVE
Protein, ur: NEGATIVE mg/dL
pH: 5.5 (ref 5.0–8.0)

## 2010-08-12 LAB — POCT PREGNANCY, URINE: Preg Test, Ur: NEGATIVE

## 2010-08-18 LAB — POCT URINALYSIS DIP (DEVICE)
Ketones, ur: NEGATIVE mg/dL
Nitrite: NEGATIVE

## 2010-10-12 NOTE — Assessment & Plan Note (Signed)
[facility]                            CARDIOLOGY OFFICE NOTE   JOLENE, GUYETT                      MRN:          [id number]  DATE:05/02/2008                            DOB:          November 16, 1985    IDENTIFICATION:  Ms. Bertling is a 25 year old with a history of probable  Marfan's, mild POTS, and ADHD.  I last saw her in clinic back in August.   In the interval, she notes she has had a few episodes of chest pain, one  occurred when she was at dinner about 3 weeks ago like a knife in her  chest, lasted about an hour, took an aspirin, and it eased off.  Did not  note if it was pleuritic as she felt she could not get the breath.  The  next day, again, she had a spell that lasted about 30-40 minutes, eased  off.  This week, actually she had a spell on Tuesday while driving,  lasted about 30 minutes, again does not appear to be positional, not  pleuritic, not radiating, and is not accompanied by shortness of breath.   Otherwise, she is very active.  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   CURRENT MEDICINES:  1. Vyvanse 40.  2. Zoloft 150.  3. Metoprolol 25.   PHYSICAL EXAMINATION:  General:  On exam, the patient is in no distress  at rest.  VITAL SIGNS:  Blood pressure is 124/80 and pulse 80.  Orthostatics:  Blood pressure lying 124/88, pulse 80; sitting 119/81, pulse 89, a  little dizzy;  standing 115/77, pulse 95, a little dizzy; standing at 2  minutes 120/80, pulse 100, okay; and at 5 minutes, 122/86,  pulse 97,  the patient okay.  Had a slight headache.  LUNGS:  Clear.  CARDIAC:  Regular rate and rhythm.  S1 and S2.  No S3.  No murmurs.  ABDOMEN:  Benign.  Mild striae on abdomen and chest.  EXTREMITIES:  No edema.   A 12-lead EKG shows normal sinus rhythm with 67 beats per minute.  Nonspecific RV conduction delay.   IMPRESSION:  1. Chest pain.  It is  concerning, I not convinced it is cardiac, but I      think we need to evaluate her aorta.  Her echocardiogram in August,      the sinuses of Valsalva were at 35 mm, ascending aorta was 28.  I      would continue on the beta-blocker but increase.  She had some      problems of shortness breath on atenolol.  With the Toprol, I have      asked her to take 1-1/2 tablets daily.  Use of Vyvanse is not optimal, but this is the only way she can  concentrate.  Again, I will be in touch with her and discuss after I  have seen the MRI.  This of course she gets a spell that is severe, she  needs to go to the emergency room.  1.  Attention deficit hyperactivity disorder as noted.  2. Question autonomic dysfunction.  I think the patient does have a      mild form of dysautonomia with POTS.  She does not meet full      criteria today.  I told her to stay adequately hydrated and taking      extra salt.   I will be in touch with the patient once I have seen the test results.     Pricilla Riffle, MD, [facility]  Electronically Signed    PVR/MedQ  DD: 05/02/2008  DT: 05/03/2008  Job #: [id number]

## 2010-10-12 NOTE — Assessment & Plan Note (Signed)
[facility]                            CARDIOLOGY OFFICE NOTE   Heather Stewart, Heather Stewart                      MRN:          [id number]  DATE:01/03/2008                            DOB:          [birth year]    IDENTIFICATION:  Heather Stewart is a 25 year old woman.  I have followed  her in clinic.  She has a history of probable Marfan syndrome (history  of dislocated lens, skeletal features consistent with Marfan's).  She  was followed by Dr. Lorna Few in the past.  I last saw her back in  February 2009.   In February, she was placed on Vyvanse for her ADHD.  Since then, she  has continued on this and actually the dose was increased to 40 mg per  day.  She was also placed on Zoloft.  She says on this, she is thinking  a lot more clearly.   This weekend, she had abdominal plain.  Her gynecologist felt she had an  ovarian cyst that ruptured.  She is due to follow up there in a couple  weeks.  Currently, she denies dizziness.  Says she is drinking adequate  fluids.   CURRENT MEDICATIONS:  1. Vyvanse 40 mg.  2. Zoloft 150.  3. Birth control pills.   PHYSICAL EXAM:  GENERAL:  The patient is in no distress at rest.  VITAL SIGNS:  Blood pressure 111/71 resting, pulse 97, sitting 118/78,  pulse 103, standing at 0-minutes 114/79, pulse 115, at 2 minutes 114/76,  pulse 117, at 5 minutes 123/79, pulse 127.  The patient asymptomatic  throughout.  LUNGS:  Clear.  CARDIAC:  Regular rate and rhythm.  S1 and S2.  No S3.  No significant  murmurs.  ABDOMEN:  Benign.  EXTREMITIES:  No edema.  Good pulses.   A 12-lead EKG, sinus tachycardia 116 beats per minute.   IMPRESSION:  Probable Marfan syndrome.  I have been following the aortic  root.  Last echocardiogram, her root was 32.5 mm.  I am concerned about  her Vyvanse.  With increased heart rate, she meets criteria for pots on  exam, but she denies dizziness and says she has been drinking  adequately.  Urine  during the day is clear.  I need to get in touch with  Dr. Gunnar Fusi at [facility] about the pulse and I have told the  patient I will be in touch with her.   For now, we will schedule her for another followup echo at her  convenience.    Pricilla Riffle, MD, [facility]  Electronically Signed   PVR/MedQ  DD: 01/03/2008  DT: 01/04/2008  Job #: [id number]

## 2010-10-12 NOTE — Assessment & Plan Note (Signed)
[facility]                            CARDIOLOGY OFFICE NOTE   MILIANNA, ERICSSON                      MRN:          [id number]  DATE:07/06/2007                            DOB:          27-Feb-1986    IDENTIFICATION:  The patient is a 25 year old woman with history of  probable Marfan's syndrome (dislocated lens, some skeletal features  consistent with Marfan's).  She had previously been followed by Lorna Few.  I saw her back in March.   She had called in to the clinic saying she was seen in psychiatry and  was placed on medicine for ADHD and told to come in to get evaluated.   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  She has been  started on sertraline as well as well as Vyvanse 20 mg for depression  and ADHD.  She said without Vyvanse she just cannot concentrate and her  grades are abysmal.   Since she was seen in March, the patient has had a few episodes of near  syncope.  She felt hot, sweaty and things started going black.  She  never really passed out.  She was standing and at school at that time.  She has denied any palpitations associated with a spell, denies  dizziness at other times.  It does not sound like she was drinking much  around the time that these happened.   CURRENT MEDICATIONS:  Sertraline HL 50 50, Vyvanse 20.   PHYSICAL EXAM:  The patient is in no distress.  Blood pressure  orthostatic, sitting blood pressure 117/54 pulse 54, standing at 0-  minute blood pressure 117/75 pulse 83, at 2 minutes 111/73 pulse 79 and  at  5 minutes 123/76 pulse 78.  The patient asymptomatic throughout  this.  LUNGS were clear.  CARDIAC exam regular rate and rhythm, S1-S2 no S3-S4,  murmurs or  definite clicks.  ABDOMEN is benign.  EXTREMITIES:  No edema.   IMPRESSION:  Ms. Eberle is a 25 year old woman with possible Marfan's.  Note, her last echocardiogram was done in March.  LV function was  normal.  The aortic root  was normal in size at 32.5 mm.  There is a  suggestion of prolapse but not confirmed.   1. The patient now is being evaluated by [facility], Dr. Lance Coon,      and is being treated for depression and ADHD with the above agents.      Overall I think my goal is to limit the doses of the Vyvanse-like      agents if possible to efficacy.  I will be in touch with Dr. Rickey Primus      regarding this.  2. Presyncope.  In regards to the patient's episodes of presyncope it      sounds like she has not gotten enough in, plus she may have been a      little hypervagal at the time.  Indeed today she has evidence of      mild orthostasis with increase in her pulse rate from 52-78.  Blood  pressure does increase actually with standing consistent with some      hyperadrenergic tone.  I have discussed this with her and      encouraged to increase her fluid intake throughout the day.   I will be in touch with the patient once I have talked to Dr. Rob Bunting.  Continue on the medicines for now, activities as tolerated.     Pricilla Riffle, MD, [facility]  Electronically Signed    PVR/MedQ  DD: 07/19/2007  DT: 07/20/2007  Job #: [id number]

## 2010-10-13 ENCOUNTER — Telehealth: Payer: Self-pay | Admitting: *Deleted

## 2010-10-13 MED ORDER — AZITHROMYCIN 250 MG PO TABS: ORAL_TABLET | ORAL | Status: DC

## 2010-10-13 NOTE — Telephone Encounter (Signed)
Was sister diagnosed with nasal swab?   Pt is up to date on tetanus vaccine.  Reassuring. However we do have option of treating.  Will send in zpack if patient desires for prophylaxis, would be able to continue to work. Otherwise may monitor, but if any upper resp sxs/cough, would need to stay out of work until completes treatment. Let me know if any questions.

## 2010-10-13 NOTE — Telephone Encounter (Signed)
Pt's sister has been dx'd with pertussis and pt is asking if she should be treated.  She went to the beach with her, rode in the car with her, when she had the cough.  Her sister's doctor has recommended that several family members be treated.  Please advise on what you think.

## 2010-10-14 ENCOUNTER — Other Ambulatory Visit: Payer: Self-pay | Admitting: *Deleted

## 2010-10-14 MED ORDER — AZITHROMYCIN 250 MG PO TABS: ORAL_TABLET | ORAL | Status: AC

## 2010-10-14 MED ORDER — AZITHROMYCIN 250 MG PO TABS: ORAL_TABLET | ORAL | Status: DC

## 2010-10-14 NOTE — Telephone Encounter (Signed)
Rx printed in error. Re-ordered and sent in electronically.

## 2010-10-14 NOTE — Telephone Encounter (Signed)
She wasn't sure how her sister was diagnosed. Her mother just called and said that's what the pediatrician said she had and that everyone needed to be treated with prophylactic abx. Advised patient as long as she wasn't coughing or with any upper resp symptoms, then she could continue to work. If she developed symptoms, then she needed to remain out of work until abx course was completed. She verbalized understanding and abx were sent to [facility] per patient request.

## 2010-10-15 NOTE — Op Note (Signed)
[facility]  Patient:    Heather Stewart, SHANE Visit Number: [id number] MRN: [id number]          Service Type: Attending:  Nicki Reaper, M.D. Dictated by:   Nicki Reaper, M.D. Proc. Date: 05/18/01                             Operative Report  PREOPERATIVE DIAGNOSIS:  Mass, left wrist.  POSTOPERATIVE DIAGNOSIS:  Mass, left wrist.  OPERATION:  Excision mass, left wrist.  SURGEON:   Nicki Reaper, M.D.  ASSISTANT:  None.  ANESTHESIA:  Upper arm IV regional.  ANESTHESIOLOGIST:  Janetta Hora. Gelene Mink, M.D.  HISTORY:  The patient is a 25 year old female with a history of a recurrent mass of the dorsal aspect of her left hand which has not responded to conservative treatment.  PROCEDURE:  The patient was brought to the operating room where an IV regional anesthetic was carried out without difficulty.  She was prepped and draped using Betadine scrub and solution with the left arm free.  A transverse incision was made over the mass, carried down through subcutaneous tissue. Bleeders were electrocauterized, neurovascular structures protected.  The mass was immediately encountered.  This was a markedly thick mass covering the entire dorsum of the scapholunate area.  With blunt and sharp dissection this was dissected free, was found to have an involuted area to it, appearing to be a large cyst within a cyst.  The entire area was excised.  The capsule was then closed after irrigation of the wound with figure-of-eight 4-0 Vicryl sutures.  The retinaculum was closed with interrupted 4-0 Vicryl, the skin with a subcuticular 4-0 Monocryl sutures.  Steri-Strips were applied, sterile compressive dressing and splint were applied.  The patient tolerated the procedure well and was taken to the recovery room for observation in satisfactory condition.  DISPOSITION:  She is discharged home to return to [facility] [place] in one week on Vicodin and  Keflex. Dictated by:   Nicki Reaper, M.D. Attending:  Nicki Reaper, M.D. DD:  05/18/01 TD:  05/19/01 Job: [id number]

## 2010-10-15 NOTE — Assessment & Plan Note (Signed)
[facility]                            CARDIOLOGY OFFICE NOTE   TRENAE, BRUNKE                      MRN:          [id number]  DATE:08/14/2006                            DOB:          1985/12/27    Ms. Gadsby is a 25 year old woman (birthday 02-06-1986) who has a  history of probable Marfan's.  History of a dislocated lens, some  skeletal features consistent with Marfan.  She was followed by Lorna Few in the past.  I last saw her back in 2006.  The patient was taken  off atenolol therapy.  She had been erratic in it's use before.   She comes in for followup today.  She says, over the past couple of  months, she has had episodes of shortness of breath and some chest  heaviness.  It occurs every day, more in the evening.  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  She will go to sleep, and she will wake up and the pain  will be gone.  Mother also says she complains of shortness of breath,  when she has gone out with her friends as night.  She does not smoke,  denies wheezing.   CURRENT MEDICATIONS:  None.   REVIEW OF SYSTEMS:  The patient is trying to get into nursing school at  [employer].  She also has a part-time job.  She is having problems focusing at  school, has been on Ritalin in the past.  Last time she took it, her  heart was racing.   PHYSICAL EXAMINATION:  GENERAL:  On exam, the patient is in no distress,  blood pressure 114/79.  Pulse is 67, weight 174.  NECK:  JVP is normal, no thyromegaly.  LUNGS:  Clear.  No wheezes or rales.  CHEST:  Nontender.  CARDIAC:  Regular rate and rhythm, S1, S2, no S3, S4 or murmurs.  ABDOMEN:  Benign.  EXTREMITIES:  No edema.  Good pulses throughout.   A 12-lead EKG:  Normal sinus rhythm, 67 beats per minute, non-specific  ST changes.   IMPRESSION:  Ms. Bracher is a 25 year old girl with possible Marfan's.  I reviewed her  electrocardiogram today, and indeed her echo is normal.  The aortic root has not changed significantly.  It is about 30 to 31 mm.  Ascending aorta and arch are normal.  LV function is normal.   I am not sure what these spells represent.  Again, with a normal and no  change from previous, I do not think it represents a significant issue  concerning for dissection or coronary insufficiency.  Question if she  has some mild reactive airway disease and will set her up for Pulmonary  function tests, hopefully this week.  Again, I told her to take  activities as tolerated.  Her mother thinks some anxiety component may  be in this, with the patient's increased stress.   Tentatively, I have not set a followup but will be in touch with  patient.     Sherol Dade  Tenny Craw, MD, [facility]  Electronically Signed    PVR/MedQ  DD: 08/14/2006  DT: 08/15/2006  Job #: [id number]

## 2010-10-19 ENCOUNTER — Encounter: Payer: Self-pay | Admitting: Family Medicine

## 2010-10-19 ENCOUNTER — Ambulatory Visit (HOSPITAL_COMMUNITY)
Admission: RE | Admit: 2010-10-19 | Payer: Managed Care, Other (non HMO) | Source: Home / Self Care | Admitting: Psychiatry

## 2010-10-20 ENCOUNTER — Ambulatory Visit: Payer: Managed Care, Other (non HMO) | Admitting: Family Medicine

## 2011-02-07 ENCOUNTER — Telehealth: Payer: Self-pay | Admitting: Internal Medicine

## 2011-02-07 DIAGNOSIS — Q8740 Marfan's syndrome, unspecified: Secondary | ICD-10-CM

## 2011-02-07 NOTE — Telephone Encounter (Signed)
Will send order to [facility] and ask for appointment with Dr.Ross

## 2011-02-07 NOTE — Telephone Encounter (Signed)
Pt would like to be set up for echo / ekg.

## 2011-02-08 ENCOUNTER — Telehealth: Payer: Self-pay | Admitting: Internal Medicine

## 2011-02-08 ENCOUNTER — Other Ambulatory Visit: Payer: Self-pay | Admitting: *Deleted

## 2011-02-08 DIAGNOSIS — Q8740 Marfan's syndrome, unspecified: Secondary | ICD-10-CM

## 2011-02-08 NOTE — Telephone Encounter (Signed)
Pt calling to schedule ECHO and EKG. Pt said she gets both done yearly and wanted to go ahead and schedule them before next year. Please return pt call to discuss further.

## 2011-02-08 NOTE — Telephone Encounter (Signed)
ORDER ENTERED FOR ECHO AND STAFF MESSAGE WAS SENT TO [facility]  TO CALL PT AND SCHEDULE YEARLY APPT  .Heather Stewart

## 2011-02-16 ENCOUNTER — Encounter: Payer: Self-pay | Admitting: *Deleted

## 2011-02-24 ENCOUNTER — Encounter: Payer: Self-pay | Admitting: Internal Medicine

## 2011-02-25 ENCOUNTER — Ambulatory Visit (INDEPENDENT_AMBULATORY_CARE_PROVIDER_SITE_OTHER): Payer: Managed Care, Other (non HMO) | Admitting: Internal Medicine

## 2011-02-25 ENCOUNTER — Ambulatory Visit (HOSPITAL_COMMUNITY): Payer: Managed Care, Other (non HMO) | Attending: Internal Medicine | Admitting: Radiology

## 2011-02-25 DIAGNOSIS — M359 Systemic involvement of connective tissue, unspecified: Secondary | ICD-10-CM

## 2011-02-25 DIAGNOSIS — I359 Nonrheumatic aortic valve disorder, unspecified: Secondary | ICD-10-CM | POA: Insufficient documentation

## 2011-02-25 DIAGNOSIS — Q8740 Marfan's syndrome, unspecified: Secondary | ICD-10-CM | POA: Insufficient documentation

## 2011-02-25 DIAGNOSIS — Z136 Encounter for screening for cardiovascular disorders: Secondary | ICD-10-CM

## 2011-02-25 DIAGNOSIS — Z0000 Encounter for general adult medical examination without abnormal findings: Secondary | ICD-10-CM

## 2011-02-25 DIAGNOSIS — R42 Dizziness and giddiness: Secondary | ICD-10-CM | POA: Insufficient documentation

## 2011-02-25 DIAGNOSIS — I079 Rheumatic tricuspid valve disease, unspecified: Secondary | ICD-10-CM | POA: Insufficient documentation

## 2011-02-25 LAB — LIPID PANEL
Cholesterol: 147 mg/dL (ref 0–200)
HDL: 32.2 mg/dL — ABNORMAL LOW (ref >39.00)
LDL Cholesterol: 99 mg/dL (ref 0–99)
VLDL: 15.8 mg/dL (ref 0.0–40.0)

## 2011-02-25 NOTE — Patient Instructions (Signed)
Lipid Panel. Will call you with results.  Your physician wants you to follow-up in 2 years. You will receive a reminder letter in the mail two months in advance. If you don't receive a letter, please call our office to schedule the follow-up appointment.

## 2011-03-08 ENCOUNTER — Telehealth: Payer: Self-pay | Admitting: Internal Medicine

## 2011-03-08 DIAGNOSIS — M359 Systemic involvement of connective tissue, unspecified: Secondary | ICD-10-CM | POA: Insufficient documentation

## 2011-03-08 DIAGNOSIS — Z0000 Encounter for general adult medical examination without abnormal findings: Secondary | ICD-10-CM | POA: Insufficient documentation

## 2011-03-08 DIAGNOSIS — Z0001 Encounter for general adult medical examination with abnormal findings: Secondary | ICD-10-CM | POA: Insufficient documentation

## 2011-03-08 NOTE — Assessment & Plan Note (Signed)
Will need to check fasting lipids.  Encouraged her to watch patient sizes.  Encouraged her to stay active.

## 2011-03-08 NOTE — Assessment & Plan Note (Signed)
Patient had an echo today that was normal.  Aorta was normal  No valve pathology.  Normal LV function She shows no signs of cardiac involvement. I think she should be followed periodically bu think she should do well.  No restrictions.

## 2011-03-08 NOTE — Progress Notes (Signed)
HPI Heather Stewart is a 25 year old with a history of some connective tissue disease.  She was followed as child by Heather Stewart.  I continued to see her and last saw her in 2010. She has had a dislocated lens.  Also striae.  No signif cardiac or aortic abnormalities. I saw her last in 2010.  Since then she has done OK from a cardiac standpoint.  Active.  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  No palpitations.  No SOB. Difficult year since her younger brother died of cancer.  Allergies  Allergen Reactions  . Bacitracin     REACTION: rash  . Doxycycline     REACTION: burning in stomache  . ONG:EXBMWUXLKGM+WNUUVOZDG+UYQIHKVQQV Acid+Aspartame     REACTION: diarrhea  . Neomycin     REACTION: rash    Current Outpatient Prescriptions  Medication Sig Dispense Refill  . ALPRAZolam (XANAX) 1 MG tablet Take 1 mg by mouth daily as needed.          Past Medical History  Diagnosis Date  . Connective tissue disease     probable Marfans variant  . ADHD (attention deficit hyperactivity disorder)   . Depression   . Adjustment disorder   . Dizziness and giddiness   . Esophageal reflux   . Irritable bowel syndrome   . Hx of migraines   . Screening examination for pulmonary tuberculosis   . Swelling, mass, or lump in head and neck     Past Surgical History  Procedure Date  . Myringotomy   . Ganglion cyst excision     Left wrist  . Frenulectomy, lingual 2001  . Foot surgery     Right-glass removed in OR    Family History  Problem Relation Age of Onset  . Alcohol abuse Father   . Other Father     Spinal problem  . Migraines Mother   . Coronary artery disease      GF 5v bypass  . COPD Paternal Grandfather   . Breast cancer Maternal Aunt     x2  . Cancer Brother     HALF brother (had kidney removed at 61 months-then died of cancer)  . Breast cancer      Aunts x3  . Heart disease      MGF, PGF, GGM  . Kidney disease      GGM  . Liver cancer Brother     History   Social History    . Marital Status: Single    Spouse Name: N/A    Number of Children: N/A  . Years of Education: N/A   Occupational History  . [employer]    Social History Main Topics  . Smoking status: Never Smoker   . Smokeless tobacco: Not on file  . Alcohol Use: Yes     less than 4/week  . Drug Use: No  . Sexually Active: Not on file   Other Topics Concern  . 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  They are negative to the above problem except as previously stated.  Vital Signs: BP 111/71  Pulse 62  Ht 5\' 10"  (1.778 m)  Wt 190 lb (86.183 kg)  BMI 27.26 kg/m2  Physical Exam Heather Stewart is in NAD HEENT:  Normocephalic, atraumatic. EOMI, PERRLA.  Neck: JVP is normal. No thyromegaly. No bruits.  Lungs: clear to auscultation. No rales no wheezes.  Heart: Regular rate and rhythm. Normal S1, S2. No S3.   No significant murmurs. PMI not displaced.  Abdomen:  Supple, nontender. Normal bowel sounds. No masses. No hepatomegaly.  Extremities:   Good distal pulses throughout. No lower extremity edema.  Musculoskeletal :moving all extremities.  Neuro:   alert and oriented x3.  CN II-XII grossly intact. Skin:  Mild striae. EKG:  Sinus bradycardia.  57 bpm.   Assessment and Plan:

## 2011-03-08 NOTE — Telephone Encounter (Signed)
Pt calling re letter from dr Tenny Craw to get life insurance calling re status

## 2011-03-08 NOTE — Telephone Encounter (Signed)
Called wanting to know the status of letter Dr. Tenny Craw was going to write stating that she did not have Marfan's so she could get life insurance. Dr. Tenny Craw spoke w/her on OV 9/28 stating she was going to call [facility] to see how to write letter. Advised Dr. Tenny Craw and her nurse Annice Pih would not be back in office until Thursday and would have them check on the letter and call her Thursday.

## 2011-03-30 NOTE — Telephone Encounter (Signed)
Pt has called three times Pt is calling back again about letter to get life insurance. Please call her back

## 2011-03-30 NOTE — Telephone Encounter (Signed)
Please address

## 2011-04-01 ENCOUNTER — Encounter: Payer: Self-pay | Admitting: Internal Medicine

## 2011-04-01 NOTE — Telephone Encounter (Signed)
Called patient. Letter done by Dr.Ross. Mailed to her home address.

## 2011-05-30 ENCOUNTER — Telehealth: Payer: Self-pay | Admitting: Gastroenterology

## 2011-05-30 DIAGNOSIS — R140 Abdominal distension (gaseous): Secondary | ICD-10-CM

## 2011-05-30 NOTE — Telephone Encounter (Signed)
Pt reports she would like the blood testing for Gluten intolerance. She reports noticing problems on different occasions where she will have bloating and abdominal pain. She reports drinking a few beers the other night and had terrible pain and bloating. I asked if she ate much bread/wheat and she stated no. She wonders if she has IBS or a Gluten problem. Pt saw Dr Russella Dar 07/08/10 for epigastric pain, n?V and esophageal burning. She was ordered Omeprazole, a Hida Scan and EGD. She cancelled the HIDA and the EGD showed reactive Gastropathy. Dr Russella Dar, can we order Celiac panel for pt? Pt understands Dr Russella Dar is off until next week.

## 2011-05-31 NOTE — Telephone Encounter (Signed)
Can send a celiac panel including IgA

## 2011-06-01 NOTE — Telephone Encounter (Signed)
lmom for pt that Dr Russella Dar ordered the Celiac Panel for her and left lab times.

## 2011-06-02 ENCOUNTER — Other Ambulatory Visit: Payer: Managed Care, Other (non HMO)

## 2011-06-02 DIAGNOSIS — R140 Abdominal distension (gaseous): Secondary | ICD-10-CM

## 2011-06-03 LAB — CELIAC PANEL 10
Gliadin IgA: 2 U/mL (ref <20)
IgA: 173 mg/dL (ref 69–380)
Tissue Transglut Ab: 4.2 U/mL (ref <20)

## 2011-06-13 ENCOUNTER — Ambulatory Visit: Payer: Managed Care, Other (non HMO) | Admitting: Gastroenterology

## 2011-06-21 ENCOUNTER — Ambulatory Visit (INDEPENDENT_AMBULATORY_CARE_PROVIDER_SITE_OTHER): Payer: Managed Care, Other (non HMO) | Admitting: Gastroenterology

## 2011-06-21 ENCOUNTER — Encounter: Payer: Self-pay | Admitting: Gastroenterology

## 2011-06-21 VITALS — BP 110/70 | HR 75 | Ht 70.0 in | Wt 194.6 lb

## 2011-06-21 DIAGNOSIS — R109 Unspecified abdominal pain: Secondary | ICD-10-CM

## 2011-06-21 DIAGNOSIS — R143 Flatulence: Secondary | ICD-10-CM

## 2011-06-21 DIAGNOSIS — R141 Gas pain: Secondary | ICD-10-CM

## 2011-06-21 DIAGNOSIS — R142 Eructation: Secondary | ICD-10-CM

## 2011-06-21 MED ORDER — ALIGN 4 MG PO CAPS: 1.0000 | ORAL_CAPSULE | Freq: Every day | ORAL | Status: DC

## 2011-06-21 MED ORDER — GLYCOPYRROLATE 1 MG PO TABS: 1.0000 mg | ORAL_TABLET | Freq: Two times a day (BID) | ORAL | Status: DC

## 2011-06-21 MED ORDER — OMEPRAZOLE 20 MG PO CPDR: 20.0000 mg | DELAYED_RELEASE_CAPSULE | Freq: Every day | ORAL | Status: DC

## 2011-06-21 NOTE — Progress Notes (Signed)
History of Present Illness: This is a 26 year old female who complains of nausea, intestinal gas, flatulence, bloating and lower abdominal pain that is associated with all the above symptoms. She has occasional episodes of diarrhea and occasional constipation. A recent celiac panel was negative. Upper endoscopy performed one year ago showed gastritis. Denies weight loss,change in stool caliber, melena, hematochezia, vomiting, dysphagia, reflux symptoms, chest pain.  Current Medications, Allergies, Past Medical History, Past Surgical History, Family History and Social History were reviewed in [employer] record.  Physical Exam: General: Well developed , well nourished, no acute distress Head: Normocephalic and atraumatic Eyes:  sclerae anicteric, EOMI Ears: Normal auditory acuity Mouth: No deformity or lesions Lungs: Clear throughout to auscultation Heart: Regular rate and rhythm; no murmurs, rubs or bruits Abdomen: Soft, non tender and non distended. No masses, hepatosplenomegaly or hernias noted. Normal Bowel sounds Musculoskeletal: Symmetrical with no gross deformities  Pulses:  Normal pulses noted Extremities: No clubbing, cyanosis, edema or deformities noted Neurological: Alert oriented x 4, grossly nonfocal Psychological:  Alert and cooperative. Normal mood and affect  Assessment and Recommendations:  1. Gas, bloating, nausea and lower abdominal pain.Begin a low gas diet, Gas-X 3 times a day, omeprazole 20 mg daily and a trial of probiotics. If her symptoms do not respond consider a trial of Xifaxan. Return office visit 6 weeks.

## 2011-06-21 NOTE — Patient Instructions (Signed)
Your prescriptions for glycopyrrolate and omeprazole have been sent to your pharmacy.  Start Align samples one tablet by mouth once daily x 2 weeks. If this probiotic does not help then try Florastor or Restora over the counter once daily x 2 weeks.  Low gas diet given. Use Gas-x three times a day as needed for gas relief.  cc: Eustaquio Boyden, MD

## 2011-08-29 DIAGNOSIS — H579 Unspecified disorder of eye and adnexa: Secondary | ICD-10-CM

## 2011-08-29 HISTORY — DX: Unspecified disorder of eye and adnexa: H57.9

## 2011-08-30 ENCOUNTER — Telehealth: Payer: Self-pay | Admitting: Internal Medicine

## 2011-08-30 NOTE — Telephone Encounter (Signed)
Left message

## 2011-08-30 NOTE — Telephone Encounter (Signed)
Discussed with Lawson Fiscal and she reviewed patients chart.  Echo normal Sept. 2012, patient needs to contact PCP  Chest pain with some nausea.  Chest pain worse with takes a deep breath and radiates to back. Pain was so bad Friday she felt she needed to go to emergency room but didn't, not as bad now.  She has been using Ibuprofen but it is not helping.  Patient does have a history of Marfan syndrome.  Patient doesn't really want a scan since she had one in 2009.  Dr Tenny Craw out of the office, will discuss with Dawayne Patricia. NP

## 2011-08-30 NOTE — Telephone Encounter (Signed)
Fu call °Patient returning your call °

## 2011-08-30 NOTE — Telephone Encounter (Signed)
Pt has been expericening chest pain since last Friday ibuprophen not working tylenol takes edge off and she is concerned and needs to know what to do

## 2011-08-30 NOTE — Telephone Encounter (Signed)
Advised patient, verbalized understanding  

## 2011-09-06 ENCOUNTER — Encounter: Payer: Self-pay | Admitting: Family Medicine

## 2011-09-06 ENCOUNTER — Ambulatory Visit (INDEPENDENT_AMBULATORY_CARE_PROVIDER_SITE_OTHER): Payer: Managed Care, Other (non HMO) | Admitting: Family Medicine

## 2011-09-06 ENCOUNTER — Telehealth: Payer: Self-pay | Admitting: Radiology

## 2011-09-06 ENCOUNTER — Ambulatory Visit (INDEPENDENT_AMBULATORY_CARE_PROVIDER_SITE_OTHER)
Admission: RE | Admit: 2011-09-06 | Discharge: 2011-09-06 | Disposition: A | Discharge status: Home / Self Care | Payer: Managed Care, Other (non HMO) | Source: Ambulatory Visit | Attending: Family Medicine | Admitting: Family Medicine

## 2011-09-06 DIAGNOSIS — R0602 Shortness of breath: Secondary | ICD-10-CM

## 2011-09-06 DIAGNOSIS — R079 Chest pain, unspecified: Secondary | ICD-10-CM

## 2011-09-06 LAB — CBC WITH DIFFERENTIAL/PLATELET
Eosinophils Relative: 1.1 % (ref 0.0–5.0)
Monocytes Absolute: 0.4 10*3/uL (ref 0.1–1.0)
Monocytes Relative: 4.7 % (ref 3.0–12.0)
Neutrophils Relative %: 68.8 % (ref 43.0–77.0)
Platelets: 252 10*3/uL (ref 150.0–400.0)
WBC: 9 10*3/uL (ref 4.5–10.5)

## 2011-09-06 LAB — BASIC METABOLIC PANEL
Calcium: 9.5 mg/dL (ref 8.4–10.5)
Creatinine, Ser: 0.8 mg/dL (ref 0.4–1.2)
GFR: 91.52 mL/min (ref >60.00)

## 2011-09-06 LAB — D-DIMER, QUANTITATIVE: D-Dimer, Quant: 0.33 ug/mL-FEU (ref 0.00–0.48)

## 2011-09-06 LAB — TSH: TSH: 0.9 u[IU]/mL (ref 0.35–5.50)

## 2011-09-06 MED ORDER — CYCLOBENZAPRINE HCL 10 MG PO TABS: 10.0000 mg | ORAL_TABLET | Freq: Three times a day (TID) | ORAL | Status: AC | PRN

## 2011-09-06 MED ORDER — ALBUTEROL SULFATE (2.5 MG/3ML) 0.083% IN NEBU
2.5000 mg | INHALATION_SOLUTION | Freq: Once | RESPIRATORY_TRACT | Status: AC
  Administered 2011-09-06: 2.5 mg via RESPIRATORY_TRACT

## 2011-09-06 MED ORDER — IPRATROPIUM BROMIDE 0.02 % IN SOLN
0.5000 mg | Freq: Once | RESPIRATORY_TRACT | Status: AC
  Administered 2011-09-06: 0.5 mg via RESPIRATORY_TRACT

## 2011-09-06 MED ORDER — NAPROXEN 500 MG PO TABS: ORAL_TABLET | ORAL | Status: DC

## 2011-09-06 MED ORDER — ALBUTEROL SULFATE HFA 108 (90 BASE) MCG/ACT IN AERS: 2.0000 | INHALATION_SPRAY | Freq: Four times a day (QID) | RESPIRATORY_TRACT | Status: DC | PRN

## 2011-09-06 NOTE — Telephone Encounter (Signed)
Patient calling for lab results. She was told to call if she hasn't heard from [place] by 4:00. Advised you haven't had a chance to look at her results yet as you were seeing patients and we just got them. She said she is still SOB and the inhaler hasn't helped. I advised we would call her back as soon as you review results.

## 2011-09-06 NOTE — Telephone Encounter (Signed)
plz notify D dimer returned normal at 0.33.  Points against PE. Will send in muscle relaxant for presumed thoracic muscle strain and also prescription strength anti inflammatory. Continue use of albuterol. If not better in 1-2 days to call me.

## 2011-09-06 NOTE — Telephone Encounter (Signed)
[facility] lab called STAT D Dimer results, normal result of 0.33
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
## 2011-09-06 NOTE — Patient Instructions (Addendum)
Chest xray and EKG today - overall ok. We will check blood today (D dimer, TSH, CBC and BMP). We will call you at [phone] (M) with results and next step. If all normal, may consider muscle relaxant for possible thoracic muscle spasm/tightness. If not better, will refer you back to cardiology. If worsening shortness of breath in meantime, please be evaluated urgently. Good to see you today, call us with questions. I've sent in albuterol inhaler to use as needed for shortness of breath.

## 2011-09-06 NOTE — Telephone Encounter (Signed)
Patient notified. She will try meds and call back if no better.

## 2011-09-13 DIAGNOSIS — H27113 Subluxation of lens, bilateral: Secondary | ICD-10-CM | POA: Insufficient documentation

## 2011-09-13 DIAGNOSIS — H5210 Myopia, unspecified eye: Secondary | ICD-10-CM | POA: Insufficient documentation

## 2011-09-25 ENCOUNTER — Encounter: Payer: Self-pay | Admitting: Family Medicine

## 2011-11-17 ENCOUNTER — Telehealth: Payer: Self-pay

## 2011-11-17 NOTE — Telephone Encounter (Signed)
Heather Stewart with [facility] request current med list. Heather Stewart has received referral from pts employer about depression and risk of suicide(fleeting thoughts of suicide). Pt has appt with [facility] on 11/23/11. Per Heather Stewart, office manager explained to Heather Stewart that we would need release of information form signed at our office by patient with specific information to be released; DOS and type of info that can be released.Heather Stewart acknowledged understanding and Heather Stewart will contact pt.

## 2011-11-18 NOTE — Telephone Encounter (Signed)
Will await signed ROI form then ok to send.

## 2012-02-13 ENCOUNTER — Encounter: Payer: Self-pay | Admitting: Family Medicine

## 2012-02-13 ENCOUNTER — Ambulatory Visit (INDEPENDENT_AMBULATORY_CARE_PROVIDER_SITE_OTHER): Payer: Managed Care, Other (non HMO) | Admitting: Family Medicine

## 2012-02-13 VITALS — BP 118/64 | HR 96 | Temp 98.1°F | Wt 200.2 lb

## 2012-02-13 DIAGNOSIS — F909 Attention-deficit hyperactivity disorder, unspecified type: Secondary | ICD-10-CM

## 2012-02-13 DIAGNOSIS — M359 Systemic involvement of connective tissue, unspecified: Secondary | ICD-10-CM

## 2012-02-13 DIAGNOSIS — N62 Hypertrophy of breast: Secondary | ICD-10-CM | POA: Insufficient documentation

## 2012-02-13 DIAGNOSIS — M549 Dorsalgia, unspecified: Secondary | ICD-10-CM

## 2012-02-13 MED ORDER — LISDEXAMFETAMINE DIMESYLATE 50 MG PO CAPS: 50.0000 mg | ORAL_CAPSULE | Freq: Every day | ORAL | Status: DC
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
## 2012-02-13 NOTE — Assessment & Plan Note (Signed)
Large breast size likely contributing to back pain, limiting desired full participation in activity. Will refer to plastic surgery for eval/tx options and to discuss expected benefits of surgery.

## 2012-02-13 NOTE — Assessment & Plan Note (Signed)
Sees Dr Tenny Craw cardiologist yearly.  No evidence of cardiac involvement. Has no restrictions to activity. Last echo 02/2011.

## 2012-02-13 NOTE — Assessment & Plan Note (Signed)
Does seem consistent with ADHD, h/o this as child.  I have requested records from psych eval to place in chart, pt will check with mother about where diagnosed with ADHD and if had formal psychological testing. No h/o Marfan's, but does have some form of connective tissue disease.  No cardiac involvement.  Should be ok to further take vyvanse.

## 2012-02-13 NOTE — Patient Instructions (Signed)
Sign release of information from psychiatrist, Dr. Chauncy Lean at [facility], for prior ADHD evaluation Pass by Heather Stewart's office to schedule appointment as discussed. vyvanse 1 month supply provided today.

## 2012-03-08 ENCOUNTER — Encounter: Payer: Self-pay | Admitting: Family Medicine

## 2012-05-24 ENCOUNTER — Telehealth: Payer: Self-pay

## 2012-05-24 MED ORDER — METHYLPHENIDATE HCL ER (OSM) 18 MG PO TBCR: 18.0000 mg | EXTENDED_RELEASE_TABLET | ORAL | Status: DC

## 2012-05-24 NOTE — Telephone Encounter (Signed)
I did receive records from psych. May do trial of concerta - notify ready to pick up. vyvanse causing headaches.

## 2012-05-24 NOTE — Telephone Encounter (Signed)
Advised patient, script placed at front desk for pick up.

## 2012-05-24 NOTE — Telephone Encounter (Signed)
Pt request different med for ADHD; Vyvanse causes h/a. Pt has previously taken Concerta and Ritalin while in middle and high school and pt thinks they worked well. Pt wants to know if Dr Sharen Hones received records from previous dr about ADHD.Please advise. Pt request call back when rx ready for pick up.

## 2012-06-13 ENCOUNTER — Telehealth: Payer: Self-pay

## 2012-06-13 MED ORDER — METHYLPHENIDATE HCL ER (OSM) 36 MG PO TBCR: 36.0000 mg | EXTENDED_RELEASE_TABLET | ORAL | Status: DC

## 2012-06-13 NOTE — Telephone Encounter (Signed)
Will increase concerta - printed script and placed in Kim's box. plz make appt in 1-2 mo for f/u.

## 2012-06-13 NOTE — Telephone Encounter (Signed)
Pt left v/m does not think Concerta 18 mg is helping pt focus. Pt wants to know if dose could be increased or does med need to be changed.(Vyvanse gave pt h/a).

## 2012-06-15 NOTE — Telephone Encounter (Signed)
Patient notified. Rx placed up front for pick up. She will call to schedule follow up when she knows her schedule. She is currently working 2 jobs and going to school.

## 2012-07-27 ENCOUNTER — Telehealth: Payer: Self-pay

## 2012-07-27 MED ORDER — METHYLPHENIDATE HCL ER (OSM) 54 MG PO TBCR: 54.0000 mg | EXTENDED_RELEASE_TABLET | ORAL | Status: DC

## 2012-07-27 NOTE — Telephone Encounter (Signed)
Pt left v/m requesting increase in Concerta;not working as well as Vyvanse even thou Vyvanse gave pt h/a.Please advise.

## 2012-07-27 NOTE — Telephone Encounter (Signed)
Could increase concerta to 54 mg daily - but really needs to monitor chest pain, blood pressure and headache on this dose, esp in h/o marfan's.  If any of these sxs, needs to stop immediately. I'd like her to return in next mo for f/u new dose.

## 2012-07-27 NOTE — Telephone Encounter (Signed)
Message left advising patient. Advised I would call her back on Monday.

## 2012-07-30 ENCOUNTER — Ambulatory Visit: Payer: Managed Care, Other (non HMO) | Admitting: Family Medicine

## 2012-07-30 MED ORDER — ANTIPYRINE-BENZOCAINE 5.4-1.4 % OT SOLN: 3.0000 [drp] | OTIC | Status: DC | PRN

## 2012-07-30 NOTE — Telephone Encounter (Signed)
i'm sorry she's upset but weather will change our schedule. Sent in [place] for ear drops. Will eval tomorrow.  Placed script for concerta in kim's box.

## 2012-07-30 NOTE — Telephone Encounter (Signed)
Patient notified and would like to increase meds. She also scheduled an acute visit today for earache and then couldn't be seen due to the office closing due to the weather. She was very upset because when she scheduled the appointment this morning, she asked specifically if we were closing early and was told no. I offered to send in some numbing drops for her ear until tomorrow when she could come in for eval and she was fine with that. Please send to [facility].

## 2012-07-31 ENCOUNTER — Encounter: Payer: Self-pay | Admitting: Family Medicine

## 2012-07-31 ENCOUNTER — Encounter: Payer: Self-pay | Admitting: *Deleted

## 2012-07-31 ENCOUNTER — Ambulatory Visit (INDEPENDENT_AMBULATORY_CARE_PROVIDER_SITE_OTHER): Payer: Managed Care, Other (non HMO) | Admitting: Family Medicine

## 2012-07-31 VITALS — BP 118/64 | HR 84 | Temp 98.5°F | Wt 194.2 lb

## 2012-07-31 DIAGNOSIS — H9209 Otalgia, unspecified ear: Secondary | ICD-10-CM

## 2012-07-31 DIAGNOSIS — H9202 Otalgia, left ear: Secondary | ICD-10-CM

## 2012-07-31 DIAGNOSIS — H6592 Unspecified nonsuppurative otitis media, left ear: Secondary | ICD-10-CM | POA: Insufficient documentation

## 2012-07-31 MED ORDER — FLUTICASONE PROPIONATE 50 MCG/ACT NA SUSP: 2.0000 | Freq: Every day | NASAL | Status: DC

## 2012-07-31 NOTE — Patient Instructions (Signed)
I think your sinusitis has led to fluid in ears, L > R leading to muffled hearing and earache. Treat with flonase, ibuprofen, and lots of fluids and rest. Hearing should get better over next few weeks. If persistent trouble in 3-4 weeks, let me know.

## 2012-07-31 NOTE — Telephone Encounter (Signed)
Patient aware. Script given to patient at appt today.

## 2012-07-31 NOTE — Progress Notes (Signed)
  Subjective:    Patient ID: Heather Stewart, female    DOB: 17-Sep-1985, 28 y.o.   MRN: [id number]  HPI CC: L earache  Recently treated for sinusitis by ER - started on zpack by ER physician.  4d h/o sneezing, head congestion, back pain, sinus congestion.  Dry cough.  Slept all day Sunday.  ST, + PNdrainage.  Mild nausea.  L ear ache started with above.  Some clear drainage out of ear.  Self treated with lidocaine.  + muffled hearing.  No recent swimming.  No fevers/chills, abd pain.    H/o ear tubes as child. No sick contacts at home or work. Did receive flu shot.  School - going well.  Will do trial of increased dose concerta.  Past Medical History  Diagnosis Date  . Connective tissue disease     ocular lens dislocation, flat feet, high palate, no cardiac involvement  . ADHD (attention deficit hyperactivity disorder), combined type     dx as child and then received through psychiatry - reviewed records from psych: no recent w/u.  was on vyvanse during nursing school  . MDD (major depressive disorder), recurrent episode   . Adjustment disorder   . Esophageal reflux   . Irritable bowel syndrome   . Hx of migraines   . Screening examination for pulmonary tuberculosis   . Swelling, mass, or lump in head and neck   . Eye problems 08/2011    subluxated lenses OU, rec test for homocyetinuria to r/o as cause (see scanned form)     Review of Systems Per HPI    Objective:   Physical Exam  Nursing note and vitals reviewed. Constitutional: She appears well-developed and well-nourished. No distress.  HENT:  Head: Normocephalic and atraumatic.  Right Ear: Hearing, external ear and ear canal normal.  Left Ear: External ear and ear canal normal. Decreased hearing is noted.  Nose: No mucosal edema or rhinorrhea. Right sinus exhibits no maxillary sinus tenderness and no frontal sinus tenderness. Left sinus exhibits no maxillary sinus tenderness and no frontal sinus tenderness.   Mouth/Throat: Uvula is midline, oropharynx is clear and moist and mucous membranes are normal. No oropharyngeal exudate, posterior oropharyngeal edema, posterior oropharyngeal erythema or tonsillar abscesses.  scarring bilateral TMs Fluid behind TMs L>R.   retracted L TM  Eyes: Conjunctivae and EOM are normal. Pupils are equal, round, and reactive to light. No scleral icterus.  Neck: Normal range of motion. Neck supple.  Lymphadenopathy:    She has no cervical adenopathy.  Skin: Skin is warm and dry. No rash noted.       Assessment & Plan:

## 2012-07-31 NOTE — Assessment & Plan Note (Signed)
In setting of recent sinusitis. Discussed anticipated course of resolution. Treat with flonase. No active infection today. If persistent sxs past 1 mo, consider referral to ENT in setting of multiple ear tubes growing up.

## 2012-09-24 ENCOUNTER — Other Ambulatory Visit: Payer: Self-pay

## 2012-09-24 MED ORDER — METHYLPHENIDATE HCL ER (OSM) 54 MG PO TBCR: 54.0000 mg | EXTENDED_RELEASE_TABLET | ORAL | Status: DC

## 2012-09-24 NOTE — Telephone Encounter (Signed)
Pt left v/m requesting rx Concerta; pt will be out of med on 09/27/12.call when ready for pick up.

## 2012-09-24 NOTE — Telephone Encounter (Signed)
Printed, please give to patient after I sign the rx. Thanks.

## 2012-09-25 NOTE — Telephone Encounter (Signed)
Message left notifying patient.

## 2012-10-01 ENCOUNTER — Ambulatory Visit: Payer: Managed Care, Other (non HMO) | Admitting: Family Medicine

## 2012-10-01 ENCOUNTER — Other Ambulatory Visit: Payer: Self-pay | Admitting: Obstetrics & Gynecology

## 2012-10-25 ENCOUNTER — Encounter: Payer: Self-pay | Admitting: Family Medicine

## 2012-11-05 ENCOUNTER — Ambulatory Visit (INDEPENDENT_AMBULATORY_CARE_PROVIDER_SITE_OTHER): Payer: Managed Care, Other (non HMO) | Admitting: Family Medicine

## 2012-11-05 ENCOUNTER — Encounter: Payer: Self-pay | Admitting: Family Medicine

## 2012-11-05 VITALS — BP 116/78 | HR 68 | Temp 98.5°F | Wt 198.5 lb

## 2012-11-05 DIAGNOSIS — S61519A Laceration without foreign body of unspecified wrist, initial encounter: Secondary | ICD-10-CM | POA: Insufficient documentation

## 2012-11-05 DIAGNOSIS — S61509A Unspecified open wound of unspecified wrist, initial encounter: Secondary | ICD-10-CM

## 2012-11-05 DIAGNOSIS — S61512A Laceration without foreign body of left wrist, initial encounter: Secondary | ICD-10-CM

## 2012-11-05 MED ORDER — CIPROFLOXACIN HCL 500 MG PO TABS: 500.0000 mg | ORAL_TABLET | Freq: Two times a day (BID) | ORAL | Status: DC

## 2012-11-05 NOTE — Progress Notes (Signed)
  Subjective:    Patient ID: Heather Stewart, female    DOB: 06-15-1985, 27 y.o.   MRN: [id number]  HPI CC: cut arm  Cut arm on oyster shell last night trying to get dog out from under dock. Using peroxide and alcohol and bandage with pressure dressing.  Doxycycline causes GI upset but able to take. No fevers/chills.  Tetanus - 05/2012.  Planning on beach trip to [facility] next week.  Past Medical History  Diagnosis Date  . Connective tissue disease     ocular lens dislocation, flat feet, high palate, no cardiac involvement  . ADHD (attention deficit hyperactivity disorder), combined type     dx as child and then received through psychiatry - reviewed records from psych: no recent w/u.  was on vyvanse during nursing school  . MDD (major depressive disorder), recurrent episode   . Adjustment disorder   . Esophageal reflux   . Irritable bowel syndrome   . Hx of migraines   . Screening examination for pulmonary tuberculosis   . Swelling, mass, or lump in head and neck   . Eye problems 08/2011    subluxated lenses OU, rec test for homocyetinuria to r/o as cause (see scanned form)    Review of Systems Per HPI    Objective:   Physical Exam  Nursing note and vitals reviewed. Constitutional: She appears well-developed and well-nourished. No distress.  Musculoskeletal:       Arms:      Hands: About 2cm laceration, with only 1cm open Wrist soaked in sterile water, then cleaned with iodine and irrigated with normal saline.  Using tincture of benzoin, steri strips applied to wound to help approximate edges. Wound dressed with sterile gauze.       Assessment & Plan:

## 2012-11-05 NOTE — Assessment & Plan Note (Addendum)
Wound edges approximated with steri strips. Sutures not used 2/2 contaminated nature of wound and concern for possible infection. Treat with 7d course cipro 500mg  bid to cover possible vibrio infection, avoid doxy 2/2 upcoming beach trip. Discussed avoiding soaking wound in water, and keeping clean/dry.

## 2012-11-05 NOTE — Patient Instructions (Signed)
Take cipro 500mg  twice daily for 7 days. Avoid soaking wrist. Keep clean and dry May wash with soapy water, pat dry. Let us know if concerns for developing infection like fever, spreading redness or draining pus.

## 2012-11-13 ENCOUNTER — Encounter: Payer: Self-pay | Admitting: Family Medicine

## 2012-12-20 ENCOUNTER — Telehealth: Payer: Self-pay | Admitting: Internal Medicine

## 2012-12-20 NOTE — Telephone Encounter (Signed)
New Prob      Pt would like to know when she is due for her next ECHO and EKG. Pt last states she has been experiencing plus 2 pitting adema in her lower legs. Please call.

## 2012-12-20 NOTE — Telephone Encounter (Signed)
Patient phoned c/o edema in her legs over the last few days. Recent trip to beach, bilateral edema. Patient states does start to have some tightness in her legs after she being on them all day. Swelling does improve some after being off. Had this happen in March after going on a trip, subsided after a few days. Patient concerned coming from her heart, normal echo in 2012. Advised patient to keep them elevated as much as possible, increase fluids and to call back if worse or no better. Will forward to Dr Tenny Craw for review, patient aware she is out of the office.

## 2012-12-25 NOTE — Telephone Encounter (Signed)
Agree with recomm of Lurline Idol.  Follow for now.

## 2013-01-10 ENCOUNTER — Other Ambulatory Visit: Payer: Self-pay

## 2013-01-10 NOTE — Telephone Encounter (Signed)
Pt left v/m requesting rx Concerta. Call when ready for pick up. 

## 2013-01-11 MED ORDER — METHYLPHENIDATE HCL ER (OSM) 54 MG PO TBCR: 54.0000 mg | EXTENDED_RELEASE_TABLET | ORAL | Status: DC

## 2013-01-11 NOTE — Telephone Encounter (Signed)
Printed and placed in Kim's box. 

## 2013-01-11 NOTE — Telephone Encounter (Signed)
Pt notified and Rx placed up front for pick up.

## 2013-04-19 ENCOUNTER — Ambulatory Visit (INDEPENDENT_AMBULATORY_CARE_PROVIDER_SITE_OTHER): Payer: Managed Care, Other (non HMO) | Admitting: Family Medicine

## 2013-04-19 ENCOUNTER — Encounter: Payer: Self-pay | Admitting: Family Medicine

## 2013-04-19 VITALS — BP 110/76 | HR 76 | Temp 98.6°F | Wt 199.5 lb

## 2013-04-19 DIAGNOSIS — Z111 Encounter for screening for respiratory tuberculosis: Secondary | ICD-10-CM

## 2013-04-19 DIAGNOSIS — J029 Acute pharyngitis, unspecified: Secondary | ICD-10-CM

## 2013-04-19 LAB — POCT INFLUENZA A/B: Influenza A, POC: NEGATIVE

## 2013-04-19 NOTE — Patient Instructions (Signed)
Strep test was negative. Flu swab was negative. We will call you with throat culture results as soon as they return - if you don't hear from [place] likely negative. You likely have viral pharyngitis. Push fluids and plenty of rest. May use ibuprofen 600mg  three times daily with food for throat inflammation. Salt water gargles. Suck on cold things like popsicles or warm things like herbal teas (whichever soothes the throat better). Return if fever >101.5, worsening pain, or trouble opening/closing mouth, or hoarse voice. Good to see you today, call clinic with questions.

## 2013-04-19 NOTE — Progress Notes (Signed)
Pre-visit discussion using our clinic review tool. No additional management support is needed unless otherwise documented below in the visit note.  

## 2013-04-19 NOTE — Progress Notes (Signed)
  Subjective:    Patient ID: Heather Stewart, female    DOB: 07-09-1985, 27 y.o.   MRN: [id number]  HPI CC: malaise  2d h/o bad ST, headache, back pain.  + congestion and rhinorrhea.  Nausea.  Mild cough.  Denies ear or tooth pain, abd pain.  Feeling feverish but has not taken temperature.  Did take motrin and tylenol cold 3 hours ago.  Did receive flu shot this year.  Recently went to [employer] Works at [employer]. No h/o asthma. No smokers at home.  Requests quantiferon test for school today.  No recent travel.  No recent prison or homeless shelter visit. Upcoming trip out of country.  Past Medical History  Diagnosis Date  . Connective tissue disease     ocular lens dislocation, flat feet, high palate, no cardiac involvement  . ADHD (attention deficit hyperactivity disorder), combined type     dx as child and then received through psychiatry - reviewed records from psych: no recent w/u.  was on vyvanse during nursing school  . MDD (major depressive disorder), recurrent episode   . Adjustment disorder   . Esophageal reflux   . Irritable bowel syndrome   . Hx of migraines   . Screening examination for pulmonary tuberculosis   . Swelling, mass, or lump in head and neck   . Eye problems 08/2011    subluxated lenses OU, rec test for homocyetinuria to r/o as cause (see scanned form)     Review of Systems Per HPI    Objective:   Physical Exam  Nursing note and vitals reviewed. Constitutional: She appears well-developed and well-nourished. No distress.  HENT:  Head: Normocephalic and atraumatic.  Right Ear: Hearing, external ear and ear canal normal.  Left Ear: Hearing, external ear and ear canal normal.  Nose: Mucosal edema and rhinorrhea present. Right sinus exhibits no maxillary sinus tenderness and no frontal sinus tenderness. Left sinus exhibits no maxillary sinus tenderness and no frontal sinus tenderness.  Mouth/Throat: Uvula is midline and mucous membranes are  normal. Posterior oropharyngeal erythema present. No oropharyngeal exudate, posterior oropharyngeal edema or tonsillar abscesses.  Chronic changes bilateral TMs - adipose tissue midline TMs No tonsillar exudates but thick white post nasal drainage  Eyes: Conjunctivae and EOM are normal. Pupils are equal, round, and reactive to light. No scleral icterus.  Neck: Normal range of motion. Neck supple.  Cardiovascular: Normal rate, regular rhythm, normal heart sounds and intact distal pulses.   No murmur heard. Pulmonary/Chest: Effort normal and breath sounds normal. No respiratory distress. She has no wheezes. She has no rales.  Lymphadenopathy:    She has no cervical adenopathy.  Skin: Skin is warm and dry. No rash noted.       Assessment & Plan:  Check gold quantiferon assay for TB screening per pt request.

## 2013-04-19 NOTE — Assessment & Plan Note (Signed)
Anticipate viral. RST negative. Check throat culture as well as nasal swab for flu. In interim supportive care as per instructions. Continue ibuprofen 600mg  tid with meals, salt water gargles, rest.

## 2013-04-19 NOTE — Addendum Note (Signed)
Addended by: Josph Macho A on: 04/19/2013 12:15 PM   Modules accepted: Orders

## 2013-04-21 LAB — CULTURE, GROUP A STREP: Organism ID, Bacteria: NORMAL

## 2013-04-22 ENCOUNTER — Encounter: Payer: Self-pay | Admitting: Family Medicine

## 2013-04-22 ENCOUNTER — Telehealth: Payer: Self-pay | Admitting: *Deleted

## 2013-04-22 ENCOUNTER — Encounter: Payer: Self-pay | Admitting: *Deleted

## 2013-04-22 LAB — QUANTIFERON TB GOLD ASSAY (BLOOD)
Mitogen value: 9.1 IU/mL
Quantiferon Nil Value: 0.12 IU/mL
Quantiferon Tb Ag Minus Nil Value: 0 IU/mL

## 2013-04-22 MED ORDER — HYDROCOD POLST-CHLORPHEN POLST 10-8 MG/5ML PO LQCR: 5.0000 mL | Freq: Every evening | ORAL | Status: DC | PRN

## 2013-04-22 MED ORDER — BENZONATATE 100 MG PO CAPS: 100.0000 mg | ORAL_CAPSULE | Freq: Three times a day (TID) | ORAL | Status: DC | PRN

## 2013-04-22 NOTE — Telephone Encounter (Signed)
Rx given to [facility] for patient to pick up.

## 2013-04-22 NOTE — Telephone Encounter (Signed)
Rx called in as directed.   

## 2013-04-22 NOTE — Telephone Encounter (Signed)
Tussionex has to be written script per pharmacy. Patient would like to pick up script around 2:00 if possible.

## 2013-04-22 NOTE — Telephone Encounter (Signed)
Printed and placed in Kim's box. 

## 2013-04-22 NOTE — Telephone Encounter (Signed)
See Mychart message from patient.

## 2013-04-22 NOTE — Telephone Encounter (Signed)
plz phone in cough syrup.

## 2013-04-23 ENCOUNTER — Encounter: Payer: Self-pay | Admitting: *Deleted

## 2013-04-24 ENCOUNTER — Encounter: Payer: Self-pay | Admitting: Family Medicine

## 2013-04-24 MED ORDER — AZITHROMYCIN 250 MG PO TABS: ORAL_TABLET | ORAL | Status: DC

## 2013-04-29 ENCOUNTER — Telehealth: Payer: Self-pay

## 2013-04-29 NOTE — Telephone Encounter (Signed)
Noted. If not better, please update [place].

## 2013-04-29 NOTE — Telephone Encounter (Signed)
Pt seen 04/19/13 with cough,S/T and body aches; for 5 days pt's left ear has been stopped up and pt cannot hear out of left ear,using nose spray and decongestant. Pt has not had antibiotic. No fever; productive cough with clear phlegm. No S/T now. Pt said she did not get response from Dr Reece Agar on 04/24/13; I read his response to pt. And she will get Z pack today at [facility]. Pt said she prefers not to come back in for appt due to finances if possible.Please advise.

## 2013-09-06 ENCOUNTER — Other Ambulatory Visit: Payer: Self-pay | Admitting: Orthopaedic Surgery

## 2013-09-06 DIAGNOSIS — M25562 Pain in left knee: Secondary | ICD-10-CM

## 2013-09-08 ENCOUNTER — Ambulatory Visit
Admission: RE | Admit: 2013-09-08 | Discharge: 2013-09-08 | Disposition: A | Discharge status: Home / Self Care | Payer: BC Managed Care – PPO | Source: Ambulatory Visit | Attending: Orthopaedic Surgery | Admitting: Orthopaedic Surgery

## 2013-09-08 DIAGNOSIS — M25562 Pain in left knee: Secondary | ICD-10-CM

## 2013-12-21 ENCOUNTER — Encounter: Payer: Self-pay | Admitting: Family Medicine

## 2014-07-10 ENCOUNTER — Encounter: Payer: Self-pay | Admitting: Family Medicine

## 2014-07-10 ENCOUNTER — Ambulatory Visit (INDEPENDENT_AMBULATORY_CARE_PROVIDER_SITE_OTHER): Payer: BLUE CROSS/BLUE SHIELD | Admitting: Family Medicine

## 2014-07-10 VITALS — BP 118/74 | HR 62 | Temp 98.3°F | Wt 201.8 lb

## 2014-07-10 DIAGNOSIS — R600 Localized edema: Secondary | ICD-10-CM | POA: Insufficient documentation

## 2014-07-10 LAB — POCT URINALYSIS DIPSTICK
BILIRUBIN UA: NEGATIVE
GLUCOSE UA: NEGATIVE
Ketones, UA: NEGATIVE
Leukocytes, UA: NEGATIVE
NITRITE UA: NEGATIVE
PH UA: 6
Protein, UA: NEGATIVE
SPEC GRAV UA: 1.025
Urobilinogen, UA: 0.2

## 2014-07-10 NOTE — Patient Instructions (Signed)
Let's check labs and urinalysis today - we will call you with results. Work on elevation of legs, good water intake, decrease sodium in diet. Let us know if persistent trouble.

## 2014-07-10 NOTE — Addendum Note (Signed)
Addended by: Royann Shivers A on: 07/10/2014 05:25 PM   Modules accepted: Orders

## 2014-07-10 NOTE — Addendum Note (Signed)
Addended by: Josetta Huddle on: 07/10/2014 05:36 PM   Modules accepted: Orders

## 2014-07-10 NOTE — Progress Notes (Signed)
Pre visit review using our clinic review tool, if applicable. No additional management support is needed unless otherwise documented below in the visit note. 

## 2014-07-10 NOTE — Progress Notes (Signed)
BP 118/74 mmHg  Pulse 62  Temp(Src) 98.3 F (36.8 C) (Oral)  Wt 201 lb 12 oz (91.513 kg)  SpO2 97%   CC: leg swelling  Subjective:    Patient ID: Heather Stewart, female    DOB: 1985-07-29, 29 y.o.   MRN: [id number]  HPI: LANIKA Stewart is a 29 y.o. female presenting on 07/10/2014 for Leg Swelling   Intermittent bilateral leg swelling over the last year - yesterday especially bad to 1+, today feels better. L>R. 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   Doesn't like TED hoses - feels too much compression. Endorses unhealthy salt intake.  Significant fast food intake.   On OCP.  Did have double PNA in January. Has noticed some increased dyspnea on exertion recently, attributed to recovery from recent PNA.  Normal voiding. Denies chest pain or tightness. No unilateral pain or redness of legs.   Known h/o MCTD.   Relevant past medical, surgical, family and social history reviewed and updated as indicated. Interim medical history since our last visit reviewed. Allergies and medications reviewed and updated. Current Outpatient Prescriptions on File Prior to Visit  Medication Sig  . ALPRAZolam (XANAX) 0.5 MG tablet Take 0.5 mg by mouth as needed.  . desvenlafaxine (PRISTIQ) 100 MG 24 hr tablet Take 100 mg by mouth daily.  . fluticasone (FLONASE) 50 MCG/ACT nasal spray Place 2 sprays into the nose daily.   No current facility-administered medications on file prior to visit.   Past Medical History  Diagnosis Date  . Connective tissue disease     ocular lens dislocation, flat feet, high palate, no cardiac involvement  . ADHD (attention deficit hyperactivity disorder), combined type     dx as child and then received through psychiatry - reviewed records from psych: no recent w/u.  was on vyvanse during nursing school  . MDD (major depressive disorder), recurrent episode   . Adjustment disorder   . Esophageal reflux   . Irritable bowel syndrome     . Hx of migraines   . Screening examination for pulmonary tuberculosis   . Swelling, mass, or lump in head and neck   . Eye problems 08/2011    subluxated lenses OU, rec test for homocyetinuria to r/o as cause (see scanned form)  . Chronic back pain     thoracic and lumbar - s/p unrevealing NSG eval     Review of Systems Per HPI unless specifically indicated above     Objective:    BP 118/74 mmHg  Pulse 62  Temp(Src) 98.3 F (36.8 C) (Oral)  Wt 201 lb 12 oz (91.513 kg)  SpO2 97%  Wt Readings from Last 3 Encounters:  07/10/14 201 lb 12 oz (91.513 kg)  04/19/13 199 lb 8 oz (90.493 kg)  11/05/12 198 lb 8 oz (90.039 kg)    Physical Exam  Constitutional: She appears well-developed and well-nourished. No distress.  HENT:  Mouth/Throat: Oropharynx is clear and moist. No oropharyngeal exudate.  Cardiovascular: Normal rate, regular rhythm, normal heart sounds and intact distal pulses.   No murmur heard. Pulmonary/Chest: Effort normal and breath sounds normal. No respiratory distress. She has no wheezes. She has no rales.  Musculoskeletal: She exhibits edema (tr pedal edema).  2+ DP bilaterally  Skin: Skin is warm and dry. No rash noted.  Nursing note and vitals reviewed.      Assessment & Plan:   Problem List Items Addressed This Visit    Pedal  edema - Primary    No known cardiac involvement from MCTD up to now. Has f/u with cards 08/2014. Check BMP, BNP today.  Check urinalysis as well to r/o proteinuria. Discussed importance of avoiding sodium in diet to help pedal edema and good hydration status. rec elevation of legs. Pt declines compression stockings. Update if persistent despite above recommendations. Not consistent with DVT.      Relevant Orders   Basic metabolic panel   Brain natriuretic peptide       Follow up plan: Return if symptoms worsen or fail to improve.

## 2014-07-10 NOTE — Assessment & Plan Note (Addendum)
No known cardiac involvement from MCTD up to now. Has f/u with cards 08/2014. Check BMP, BNP today.  Check urinalysis as well to r/o proteinuria. Discussed importance of avoiding sodium in diet to help pedal edema and good hydration status. rec elevation of legs. Pt declines compression stockings. Update if persistent despite above recommendations. Not consistent with DVT.

## 2014-07-11 LAB — BASIC METABOLIC PANEL WITH GFR
BUN: 15 mg/dL (ref 6–23)
CO2: 20 meq/L (ref 19–32)
Calcium: 9.2 mg/dL (ref 8.4–10.5)
Chloride: 108 meq/L (ref 96–112)
Creatinine, Ser: 0.71 mg/dL (ref 0.40–1.20)
GFR: 104.25 mL/min
Glucose, Bld: 86 mg/dL (ref 70–99)
Potassium: 4.2 meq/L (ref 3.5–5.1)
Sodium: 137 meq/L (ref 135–145)

## 2014-07-11 LAB — BRAIN NATRIURETIC PEPTIDE: Brain Natriuretic Peptide: 18.8 pg/mL (ref 0.0–100.0)

## 2014-09-12 ENCOUNTER — Encounter: Payer: Managed Care, Other (non HMO) | Admitting: Internal Medicine

## 2014-09-22 ENCOUNTER — Encounter: Payer: Self-pay | Admitting: Internal Medicine

## 2014-09-22 ENCOUNTER — Ambulatory Visit (INDEPENDENT_AMBULATORY_CARE_PROVIDER_SITE_OTHER): Payer: BLUE CROSS/BLUE SHIELD | Admitting: Internal Medicine

## 2014-09-22 VITALS — BP 118/78 | HR 72 | Ht 70.0 in | Wt 210.6 lb

## 2014-09-22 DIAGNOSIS — R0602 Shortness of breath: Secondary | ICD-10-CM

## 2014-09-22 DIAGNOSIS — M359 Systemic involvement of connective tissue, unspecified: Secondary | ICD-10-CM | POA: Diagnosis not present

## 2014-09-22 MED ORDER — FUROSEMIDE 20 MG PO TABS: 20.0000 mg | ORAL_TABLET | Freq: Every day | ORAL | Status: DC | PRN

## 2014-09-22 MED ORDER — POTASSIUM CHLORIDE ER 10 MEQ PO TBCR: 10.0000 meq | EXTENDED_RELEASE_TABLET | Freq: Every day | ORAL | Status: DC | PRN

## 2014-09-22 NOTE — Patient Instructions (Signed)
Medication Instructions:  Your physician has recommended you make the following change in your medication:  1.) lasix 20 mg take once daily as needed for swelling 2.) potassium 10 meq take once daily when you take lasix  Labwork: none  Testing/Procedures: Your physician has requested that you have an echocardiogram. Echocardiography is a painless test that uses sound waves to create images of your heart. It provides your doctor with information about the size and shape of your heart and how well your heart's chambers and valves are working. This procedure takes approximately one hour. There are no restrictions for this procedure.  Your physician has recommended that you have a cardiopulmonary stress test (CPX). CPX testing is a non-invasive measurement of heart and lung function. It replaces a traditional treadmill stress test. This type of test provides a tremendous amount of information that relates not only to your present condition but also for future outcomes. This test combines measurements of you ventilation, respiratory gas exchange in the lungs, electrocardiogram (EKG), blood pressure and physical response before, during, and following an exercise protocol.    Follow-Up: Your physician wants you to follow-up in: 1 year with Dr. Harrington Challenger.  You will receive a reminder letter in the mail two months in advance. If you don't receive a letter, please call our office to schedule the follow-up appointment.   Any Other Special Instructions Will Be Listed Below (If Applicable).

## 2014-09-22 NOTE — Progress Notes (Signed)
Cardiology Office Note   Date:  09/22/2014   ID:  Heather Stewart, DOB 1985-12-29, MRN [id number]  PCP:  Ria Bush, MD  Cardiologist:   Dorris Carnes, MD   No chief complaint on file.     History of Present Illness: Heather Stewart is a 29 y.o. female with a history ofsome connective tissue disease. She was followed as child by Despina Pole. I continued to see her and last saw her in 2012 She has had a dislocated lens. Also striae. No signif cardiac or aortic abnormalities.  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 ALPRAZolam (XANAX) 0.5 MG tablet Take 0.5 mg by mouth as needed for anxiety (for anxiety).      No current facility-administered medications for this visit.    Allergies:   Amoxicillin-pot clavulanate; Bacitracin-polymyxin b; Neomycin-bacitracin zn-polymyx; Bacitracin; Doxycycline; Neomycin; and Vyvanse   Past Medical History  Diagnosis Date  . Connective tissue disease     ocular lens dislocation, flat feet, high palate, no cardiac involvement  . ADHD (attention deficit hyperactivity disorder), combined type     dx as child and then received through psychiatry - reviewed records from psych: no recent w/u.  was on vyvanse during nursing school  . MDD (major depressive disorder), recurrent episode   . Adjustment disorder   . Esophageal reflux   . Irritable bowel syndrome   . Hx of migraines   . Screening examination for pulmonary tuberculosis   . Swelling, mass, or lump in head and neck   . Eye problems 08/2011    subluxated lenses OU, rec test for homocyetinuria to r/o as cause (see scanned form)  .  Chronic back pain     thoracic and lumbar - s/p unrevealing NSG eval     Past Surgical History  Procedure Laterality Date  . Myringotomy    . Ganglion cyst excision      Left wrist  . Frenulectomy, lingual  2001  . Foot surgery      Right-glass removed in OR     Social History:  The patient  reports that she has never smoked. She has never used smokeless tobacco. She reports that she drinks alcohol. She reports that she does not use illicit drugs.   Family History:  The patient's family history includes Alcohol abuse in her father; Breast cancer in her maternal aunt and another family member; COPD in her paternal grandfather; Cancer in her brother; Coronary artery disease in an other family member; Heart disease in an other family member; Kidney disease in an other family member; Liver cancer in her brother; Migraines in her mother; Other in her father.    ROS:  Please see the history of present illness. All other systems are reviewed and  Negative to the above problem except as noted.    PHYSICAL EXAM: VS:  BP 118/78 mmHg  Pulse 72  Ht 5\' 10"  (1.778 m)  Wt 210 lb 9.6 oz (95.528 kg)  BMI 30.22 kg/m2  GEN: Well nourished, well developed, in no acute distress HEENT: normal Neck: no JVD,  carotid bruits, or masses Cardiac: RRR; no murmurs, rubs, or gallops,no edema  Respiratory:  clear to auscultation bilaterally, normal work of breathing GI: soft, nontender, nondistended, + BS  No hepatomegaly  MS: no deformity Moving all extremities   Skin: warm and dry, Scattered papular rash     EKG:  EKG is ordered today.  SR 72 bpm     Lipid Panel    Component Value Date/Time   CHOL 147 02/25/2011 1450   TRIG 79.0 02/25/2011 1450   HDL 32.20* 02/25/2011 1450   CHOLHDL 5 02/25/2011 1450   VLDL 15.8 02/25/2011 1450   LDLCALC 99 02/25/2011 1450      Wt Readings from Last 3 Encounters:  09/22/14 210 lb 9.6 oz (95.528 kg)  07/10/14 201 lb 12 oz (91.513 kg)  04/19/13 199 lb 8 oz  (90.493 kg)      ASSESSMENT AND PLAN: 1  Cardiac  No murmurs on exam  Last echo normal including diastolic function  Will check again.    2.  Dyspmea  Will set up for cardiopulm stress test  3.  Edema  Watch salt  Lasix 20 given with 10 KCL to take prn   4.  Rash  Not sure if it is contact dermatitis    Disposition:  Tentative f/u with me in 1 yr  Further f/u based on test resut    Signed, Dorris Carnes, MD  09/22/2014 [facility] [address], [place], [place]  [address] Phone: [phone]; Fax: [phone]

## 2014-09-25 ENCOUNTER — Ambulatory Visit (HOSPITAL_COMMUNITY): Payer: BLUE CROSS/BLUE SHIELD | Attending: Internal Medicine | Admitting: Radiology

## 2014-09-25 DIAGNOSIS — M359 Systemic involvement of connective tissue, unspecified: Secondary | ICD-10-CM | POA: Diagnosis not present

## 2014-09-25 DIAGNOSIS — R0602 Shortness of breath: Secondary | ICD-10-CM | POA: Diagnosis not present

## 2014-09-25 NOTE — Progress Notes (Signed)
2D Echo Performed. Bethany McMahill, [place].

## 2014-09-30 ENCOUNTER — Encounter: Payer: Self-pay | Admitting: Internal Medicine

## 2014-10-02 ENCOUNTER — Telehealth: Payer: Self-pay | Admitting: *Deleted

## 2014-10-02 NOTE — Telephone Encounter (Signed)
Message     Aorta is normal    LV systolic function is normal Very mild diastolic dysfunction I am not convinced accounts for symtpoms    Patient should be set up for cardiopulmonary stress test       CALLED PATIENT TO INFORM.   INFORMED OF VERY MILD DIASTOLIC DYSFUNCTION--SHE HAS FURTHER QUESTION RE: THIS SINCE SHE IS ONLY 29 YRS OLD AND HAS NORMAL BLOOD PRESSURES. WOULD LIKE FURTHER INFORMATION FROM DR ROSS. SHE IS AWARE I AM FORWARDING TO DR ROSS.  HAS CPX SCHEDULED FOR 10/07/14.

## 2014-10-07 ENCOUNTER — Ambulatory Visit (HOSPITAL_COMMUNITY): Payer: BLUE CROSS/BLUE SHIELD | Attending: Internal Medicine

## 2014-10-07 DIAGNOSIS — M359 Systemic involvement of connective tissue, unspecified: Secondary | ICD-10-CM

## 2014-10-07 DIAGNOSIS — R0602 Shortness of breath: Secondary | ICD-10-CM | POA: Insufficient documentation

## 2014-10-10 DIAGNOSIS — R0602 Shortness of breath: Secondary | ICD-10-CM | POA: Diagnosis not present

## 2014-10-28 ENCOUNTER — Encounter: Payer: Self-pay | Admitting: Internal Medicine

## 2014-11-21 NOTE — Telephone Encounter (Signed)
Called pt  I have reviewed with D Bensimhon   Would recomm trial of wt loss and training

## 2014-11-24 ENCOUNTER — Other Ambulatory Visit: Payer: Self-pay | Admitting: Obstetrics and Gynecology

## 2014-11-26 LAB — CYTOLOGY - PAP

## 2015-05-04 ENCOUNTER — Encounter: Payer: Self-pay | Admitting: Family Medicine

## 2015-05-04 DIAGNOSIS — R233 Spontaneous ecchymoses: Secondary | ICD-10-CM

## 2015-05-05 NOTE — Telephone Encounter (Signed)
Appt scheduled

## 2015-05-05 NOTE — Telephone Encounter (Signed)
plz schedule lab visit for patient for CBC.

## 2015-05-07 ENCOUNTER — Encounter: Payer: Self-pay | Admitting: Family Medicine

## 2015-05-07 ENCOUNTER — Other Ambulatory Visit (INDEPENDENT_AMBULATORY_CARE_PROVIDER_SITE_OTHER): Payer: BLUE CROSS/BLUE SHIELD

## 2015-05-07 DIAGNOSIS — R233 Spontaneous ecchymoses: Secondary | ICD-10-CM

## 2015-05-07 LAB — CBC WITH DIFFERENTIAL/PLATELET
BASOS PCT: 0.5 % (ref 0.0–3.0)
Basophils Absolute: 0 10*3/uL (ref 0.0–0.1)
EOS PCT: 1 % (ref 0.0–5.0)
Eosinophils Absolute: 0.1 10*3/uL (ref 0.0–0.7)
HCT: 42.8 % (ref 36.0–46.0)
Hemoglobin: 14.3 g/dL (ref 12.0–15.0)
LYMPHS ABS: 2.2 10*3/uL (ref 0.7–4.0)
Lymphocytes Relative: 23.4 % (ref 12.0–46.0)
MCHC: 33.5 g/dL (ref 30.0–36.0)
MCV: 90.8 fl (ref 78.0–100.0)
MONO ABS: 0.4 10*3/uL (ref 0.1–1.0)
MONOS PCT: 4.7 % (ref 3.0–12.0)
NEUTROS ABS: 6.6 10*3/uL (ref 1.4–7.7)
NEUTROS PCT: 70.4 % (ref 43.0–77.0)
PLATELETS: 271 10*3/uL (ref 150.0–400.0)
RBC: 4.72 Mil/uL (ref 3.87–5.11)
RDW: 13.4 % (ref 11.5–15.5)
WBC: 9.3 10*3/uL (ref 4.0–10.5)

## 2015-05-13 ENCOUNTER — Ambulatory Visit (INDEPENDENT_AMBULATORY_CARE_PROVIDER_SITE_OTHER): Payer: BLUE CROSS/BLUE SHIELD | Admitting: Podiatry

## 2015-05-13 ENCOUNTER — Encounter: Payer: Self-pay | Admitting: Podiatry

## 2015-05-13 VITALS — BP 124/84 | HR 84 | Resp 16

## 2015-05-13 DIAGNOSIS — L600 Ingrowing nail: Secondary | ICD-10-CM | POA: Diagnosis not present

## 2015-05-13 MED ORDER — NEOMYCIN-POLYMYXIN-HC 3.5-10000-1 OT SOLN
OTIC | Status: DC
Start: 2015-05-13 — End: 2016-04-15

## 2015-05-13 NOTE — Progress Notes (Signed)
   Subjective:    Patient ID: Heather Stewart, female    DOB: [birth year], 29 y.o.   MRN: [id number]  HPI Patient presents with a bilateral nail problem, great toes. On Right foot, great toe-medial; On Left foot-great toe-lateral. Pt stated, "has connective tissue disorder".  Pt does have a history of ingrown toenails in their Left foot, great toe.    Review of Systems  All other systems reviewed and are negative.      Objective:   Physical Exam        Assessment & Plan:

## 2015-05-13 NOTE — Patient Instructions (Signed)

## 2015-05-14 ENCOUNTER — Telehealth: Payer: Self-pay | Admitting: *Deleted

## 2015-05-14 ENCOUNTER — Ambulatory Visit: Payer: BLUE CROSS/BLUE SHIELD | Admitting: Podiatry

## 2015-05-14 NOTE — Progress Notes (Signed)
Subjective:     Patient ID: Heather Stewart, female   DOB: [birth year], 29 y.o.   MRN: [id number]  HPI patient presents with an ingrown toenail right big toe and history of one on the left which is doing okay but can become occasionally painful. States she's tried trimming and soaks without relief of symptoms   Review of Systems  All other systems reviewed and are negative.      Objective:   Physical Exam  Constitutional: She is oriented to person, place, and time.  Cardiovascular: Intact distal pulses.   Musculoskeletal: Normal range of motion.  Neurological: She is oriented to person, place, and time.  Skin: Skin is warm.  Nursing note and vitals reviewed.  neurovascular status intact muscle strength adequate range of motion within normal limits with patient noted to have incurvated right hallux medial border that's painful when pressed with distal redness but no drainage and on the left hallux well-healed lateral side that is slightly irritated but overall is nowhere near as painful as the right. Good digital perfusion is noted and well oriented 3     Assessment:      ingrown toenail deformity bilateral with right being much worse than left    Plan:      H&P conditions reviewed with patient and recommended removal of the ingrown toenail right and just watching the left with padding. She wants this done and I explained risk and today I infiltrated the right hallux 60 Milligan Xylocaine Marcaine mixture remove medial border exposed matrix and applied phenol 3 applications 30 seconds followed by alcohol lavage and sterile dressing. Gave instructions on soaks and also dispensed a digital silicone pad to try to protect the distal portions of the toes from pressure

## 2015-05-14 NOTE — Telephone Encounter (Addendum)
Pt states this toe procedure is much more painful than the last, her toe has a white puffy flesh that is tender, and the bottom of the toe is painful numb, toe is pinker than the other and a little warmer, still oozing.  I encouraged pt to perform Epsom salt soak that are more drying than the antibiotic soap.  Pt states she is not able to use any antibiotic ointments she gets hives, so what is she to put on it.  I told pt to take Ibuprofen for pain and inflammation if she was able to tolerate.  05/14/2105 - Informed pt of Keflex orders to [facility], and call with status.  05/15/2015 - Pt states she is allergic to the mycin portion of the drops Dr. Paulla Dolly prescribed.  I told pt not to use, to take the prescribed keflex, epsom salt soaks and cover with a fabric bandaid or allow to air dry when resting.  Pt states understanding.

## 2015-05-15 MED ORDER — CEPHALEXIN 500 MG PO CAPS: 500.0000 mg | ORAL_CAPSULE | Freq: Four times a day (QID) | ORAL | Status: DC

## 2015-05-15 NOTE — Telephone Encounter (Signed)
Check that she is feeling better today

## 2015-05-19 ENCOUNTER — Encounter: Payer: Self-pay | Admitting: Family Medicine

## 2015-05-25 ENCOUNTER — Telehealth: Payer: Self-pay | Admitting: *Deleted

## 2015-05-25 NOTE — Telephone Encounter (Signed)
Left message for patient at [phone] (Home #) to check to see how they were doing from their ingrown toenail procedure that was performed on Wednesday, May 13, 2015. Waiting for a response.

## 2015-08-28 ENCOUNTER — Telehealth: Payer: Self-pay | Admitting: *Deleted

## 2015-08-28 NOTE — Telephone Encounter (Signed)
Pt states she had an ingrown toenail procedure here in 04/2015, and it still looks bad, she is in [place] and wanted to know if she needed to get someone there to look at it since she won't be back until May.  I told pt to go to a doctor in [place].  Pt agreed.

## 2016-04-15 ENCOUNTER — Encounter: Payer: Self-pay | Admitting: Family Medicine

## 2016-04-15 ENCOUNTER — Ambulatory Visit (INDEPENDENT_AMBULATORY_CARE_PROVIDER_SITE_OTHER): Payer: BLUE CROSS/BLUE SHIELD | Admitting: Family Medicine

## 2016-04-15 VITALS — BP 104/64 | HR 76 | Temp 98.1°F | Wt 219.0 lb

## 2016-04-15 DIAGNOSIS — R319 Hematuria, unspecified: Secondary | ICD-10-CM

## 2016-04-15 DIAGNOSIS — Z23 Encounter for immunization: Secondary | ICD-10-CM

## 2016-04-15 DIAGNOSIS — Z113 Encounter for screening for infections with a predominantly sexual mode of transmission: Secondary | ICD-10-CM | POA: Diagnosis not present

## 2016-04-15 DIAGNOSIS — Z6831 Body mass index (BMI) 31.0-31.9, adult: Secondary | ICD-10-CM

## 2016-04-15 DIAGNOSIS — R8290 Unspecified abnormal findings in urine: Secondary | ICD-10-CM

## 2016-04-15 LAB — POC URINALSYSI DIPSTICK (AUTOMATED)
Bilirubin, UA: NEGATIVE
Glucose, UA: NEGATIVE
Ketones, UA: NEGATIVE
LEUKOCYTES UA: NEGATIVE
Nitrite, UA: NEGATIVE
PH UA: 6
PROTEIN UA: NEGATIVE
Spec Grav, UA: >=1.03
UROBILINOGEN UA: 0.2

## 2016-04-15 LAB — CBC WITH DIFFERENTIAL/PLATELET
BASOS ABS: 0 10*3/uL (ref 0.0–0.1)
Basophils Relative: 0.5 % (ref 0.0–3.0)
EOS ABS: 0.1 10*3/uL (ref 0.0–0.7)
Eosinophils Relative: 0.9 % (ref 0.0–5.0)
HEMATOCRIT: 42 % (ref 36.0–46.0)
Hemoglobin: 14.4 g/dL (ref 12.0–15.0)
LYMPHS PCT: 27.3 % (ref 12.0–46.0)
Lymphs Abs: 2.1 10*3/uL (ref 0.7–4.0)
MCHC: 34.2 g/dL (ref 30.0–36.0)
MCV: 89 fl (ref 78.0–100.0)
Monocytes Absolute: 0.4 10*3/uL (ref 0.1–1.0)
Monocytes Relative: 5.3 % (ref 3.0–12.0)
NEUTROS ABS: 5.2 10*3/uL (ref 1.4–7.7)
NEUTROS PCT: 66 % (ref 43.0–77.0)
PLATELETS: 273 10*3/uL (ref 150.0–400.0)
RBC: 4.72 Mil/uL (ref 3.87–5.11)
RDW: 13.2 % (ref 11.5–15.5)
WBC: 7.8 10*3/uL (ref 4.0–10.5)

## 2016-04-15 LAB — COMPREHENSIVE METABOLIC PANEL
ALT: 31 U/L (ref 0–35)
AST: 23 U/L (ref 0–37)
Albumin: 4 g/dL (ref 3.5–5.2)
Alkaline Phosphatase: 40 U/L (ref 39–117)
BILIRUBIN TOTAL: 0.4 mg/dL (ref 0.2–1.2)
BUN: 16 mg/dL (ref 6–23)
CALCIUM: 9.5 mg/dL (ref 8.4–10.5)
CO2: 25 meq/L (ref 19–32)
CREATININE: 0.79 mg/dL (ref 0.40–1.20)
Chloride: 102 mEq/L (ref 96–112)
GFR: 91.02 mL/min (ref >60.00)
GLUCOSE: 89 mg/dL (ref 70–99)
Potassium: 4 mEq/L (ref 3.5–5.1)
Sodium: 136 mEq/L (ref 135–145)
Total Protein: 7 g/dL (ref 6.0–8.3)

## 2016-04-15 LAB — LIPID PANEL
Cholesterol: 184 mg/dL (ref 0–200)
HDL: 44.7 mg/dL (ref >39.00)
NONHDL: 139.77
Total CHOL/HDL Ratio: 4
Triglycerides: 301 mg/dL — ABNORMAL HIGH (ref 0.0–149.0)
VLDL: 60.2 mg/dL — ABNORMAL HIGH (ref 0.0–40.0)

## 2016-04-15 LAB — TSH: TSH: 3.34 u[IU]/mL (ref 0.35–4.50)

## 2016-04-15 LAB — URINALYSIS, MICROSCOPIC ONLY: RBC / HPF: NONE SEEN (ref 0–?)

## 2016-04-15 LAB — LDL CHOLESTEROL, DIRECT: LDL DIRECT: 110 mg/dL

## 2016-04-15 NOTE — Progress Notes (Signed)
Subjective:    Patient ID: Heather Stewart, female    DOB: 03-02-1986, 30 y.o.   MRN: [id number]  HPI This is a 30 yo female who presents today for follow up of an abnormal urine dipstick. Was positive for urobili, bili, protein, ketones (all 1+ or trace) on a screening urine for work. She has long standing microscopic hematuria. Was seen years ago by urology for urinary incontinence which improved with pelvic floor PT. 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  Also needs MMR- mumps titer negative.   Has not had blood work in awhile is concerned about liver function. Drinks 10 beers at a time a couple of times a month ([employer]). Does not drink in between. Drinks with friends to "get a buzz." Rarely drinks cocktails or wine.   Depression treated by gyn whom she sees every 3-4 months. Her brother died several years ago and she has since had problems with depression. Has been frustrated by weight gain. Struggles to resist cravings even when on meds. Loves pizza, pasta and french fries. Eats out often and eats late and before sleeping due to work. No regular exercise due to time constraints as well as pain from connective tissue disorder.   Past Medical History:  Diagnosis Date  . ADHD (attention deficit hyperactivity disorder), combined type    dx as child and then received through psychiatry - reviewed records from psych: no recent w/u.  was on vyvanse during nursing school  . Adjustment disorder   . Chronic back pain    thoracic and lumbar - s/p unrevealing NSG eval   . Connective tissue disease ([place])    ocular lens dislocation, flat feet, high palate, no cardiac involvement  . Esophageal reflux   . Eye problems 08/2011   subluxated lenses OU, rec test for homocyetinuria to r/o as cause (see scanned form)  . Hx of migraines   . Irritable bowel syndrome   . MDD (major depressive  disorder), recurrent episode ([place])   . Screening examination for pulmonary tuberculosis   . Swelling, mass, or lump in head and neck    Past Surgical History:  Procedure Laterality Date  . FOOT SURGERY     Right-glass removed in OR  . [place], [place]  . GANGLION CYST EXCISION     Left wrist  . MYRINGOTOMY     Family History  Problem Relation Age of Onset  . Alcohol abuse Father   . Other Father     Spinal problem  . Migraines Mother   . Coronary artery disease      GF 5v bypass  . COPD Paternal Grandfather   . Breast cancer Maternal Aunt     x2  . Cancer Brother     HALF brother (had kidney removed at 33 months-then died of cancer)  . Breast cancer      Aunts x3  . Heart disease      MGF, PGF, GGM  . Kidney disease      GGM  . Liver cancer Brother      Review of Systems Per HPI    Objective:   Physical Exam Physical Exam  Vitals reviewed. Constitutional: Oriented to person, place, and time. Appears well-developed and well-nourished.  HENT:  Head: Normocephalic and atraumatic.  Eyes: Conjunctivae are normal.  Neck: Normal range of motion. Neck supple.  Cardiovascular:  Normal rate.   Pulmonary/Chest: Effort normal.  Musculoskeletal: Normal range of motion.  Neurological: Alert and oriented to person, place, and time.  Skin: Skin is warm and dry.  Psychiatric: Normal mood and affect. Behavior is normal. Judgment and thought content normal.      BP 104/64   Pulse 76   Temp 98.1 F (36.7 C) (Oral)   Wt 219 lb (99.3 kg)   SpO2 97%   BMI 31.42 kg/m  Wt Readings from Last 3 Encounters:  04/15/16 219 lb (99.3 kg)  09/22/14 210 lb 9.6 oz (95.5 kg)  07/10/14 201 lb 12 oz (91.5 kg)   Results for orders placed or performed in visit on 04/15/16  POCT Urinalysis Dipstick (Automated)  Result Value Ref Range   Color, UA dark yellow    Clarity, UA clear    Glucose, UA neg    Bilirubin, UA neg    Ketones, UA neg    Spec Grav, UA >=1.030     Blood, UA 1+    pH, UA 6.0    Protein, UA neg    Urobilinogen, UA 0.2    Nitrite, UA neg    Leukocytes, UA Negative Negative       Assessment & Plan:  1. Abnormal urine - urine dip shows only 1+ blood  2. Hematuria, unspecified type - long standing history according to patient, will check micro to determine if she needs further work up - CBC with Differential/Platelet - Urinalysis, microscopic only  3. BMI 31.0-31.9,adult - encouraged her to limit alcohol, dining out, increase vegetables and fruits, limit processed foods. Work to find activity she can tolerate- may need pt referral - CBC with Differential/Platelet - Comprehensive metabolic panel - Lipid panel - TSH  4. Screening for STD (sexually transmitted disease) - unable to urinate for gc/clamydia- will have checked next week at gyn - HIV antibody - RPR  5. Need for MMR vaccine - MMR vaccine subcutaneous   Clarene Reamer, FNP-BC  [facility]  04/15/2016 10:15 AM

## 2016-04-15 NOTE — Progress Notes (Signed)
Pre visit review using our clinic review tool, if applicable. No additional management support is needed unless otherwise documented below in the visit note. 

## 2016-04-16 ENCOUNTER — Encounter: Payer: Self-pay | Admitting: Family Medicine

## 2016-04-16 LAB — RPR

## 2016-04-16 LAB — HIV ANTIBODY (ROUTINE TESTING W REFLEX): HIV 1&2 Ab, 4th Generation: NONREACTIVE

## 2016-04-20 ENCOUNTER — Encounter: Payer: Self-pay | Admitting: Family Medicine

## 2016-04-20 DIAGNOSIS — M359 Systemic involvement of connective tissue, unspecified: Secondary | ICD-10-CM

## 2016-05-06 DIAGNOSIS — M2480 Other specific joint derangements of unspecified joint, not elsewhere classified: Secondary | ICD-10-CM | POA: Insufficient documentation

## 2016-05-30 HISTORY — PX: REMOVE AND REPLACE LENS: SHX6308

## 2016-11-11 ENCOUNTER — Other Ambulatory Visit (HOSPITAL_COMMUNITY): Payer: Self-pay | Admitting: Orthopaedic Surgery

## 2016-11-11 ENCOUNTER — Telehealth: Payer: Self-pay | Admitting: Internal Medicine

## 2016-11-11 DIAGNOSIS — M25562 Pain in left knee: Secondary | ICD-10-CM

## 2016-11-11 NOTE — Telephone Encounter (Signed)
Lm to call back ./cy 

## 2016-11-11 NOTE — Telephone Encounter (Signed)
Pt c/o swelling: STAT is pt has developed SOB within 24 hours  1. How long have you been experiencing swelling? It was minimal two years and has become more frequent.  2. Where is the swelling located? Both legs, patient states that her left leg has more swelling than her right.  3.  Are you currently taking a "fluid pill"? No  4.  Are you currently SOB? No  5.  Have you traveled recently? Monday for a three hour flight and legs started swelling yesterday.   Patient would also like to know if she is due for an echo and a EKG, please verify if patient needs to come in to see Dr. Harrington Challenger. Thanks.

## 2016-11-12 ENCOUNTER — Encounter (HOSPITAL_COMMUNITY): Payer: Self-pay

## 2016-11-12 ENCOUNTER — Ambulatory Visit (HOSPITAL_COMMUNITY)
Admission: RE | Admit: 2016-11-12 | Discharge: 2016-11-12 | Disposition: A | Discharge status: Home / Self Care | Payer: Managed Care, Other (non HMO) | Source: Ambulatory Visit | Attending: Orthopaedic Surgery | Admitting: Orthopaedic Surgery

## 2016-11-12 DIAGNOSIS — M25562 Pain in left knee: Secondary | ICD-10-CM | POA: Diagnosis not present

## 2016-11-12 DIAGNOSIS — M2242 Chondromalacia patellae, left knee: Secondary | ICD-10-CM | POA: Diagnosis not present

## 2016-11-12 DIAGNOSIS — S83282A Other tear of lateral meniscus, current injury, left knee, initial encounter: Secondary | ICD-10-CM | POA: Insufficient documentation

## 2016-11-12 DIAGNOSIS — S8982XA Other specified injuries of left lower leg, initial encounter: Secondary | ICD-10-CM | POA: Insufficient documentation

## 2016-11-14 NOTE — Telephone Encounter (Signed)
Pt states she has intermittent LE edema, left worse than right, seem to be worse when she is at work. Pt denies shortness of breath, states weight typically varies from day to day.  Pt was due to see Dr Harrington Challenger April 2017, I have scheduled pt to see Dr Harrington Challenger 11/25/16 for follow-up and further evaluation.

## 2016-11-25 ENCOUNTER — Ambulatory Visit: Payer: BLUE CROSS/BLUE SHIELD | Admitting: Internal Medicine

## 2016-12-01 ENCOUNTER — Telehealth: Payer: Self-pay | Admitting: Internal Medicine

## 2016-12-01 DIAGNOSIS — R600 Localized edema: Secondary | ICD-10-CM

## 2016-12-01 DIAGNOSIS — M359 Systemic involvement of connective tissue, unspecified: Secondary | ICD-10-CM

## 2016-12-01 NOTE — Telephone Encounter (Signed)
Left message for patient, will forward to dr Harrington Challenger for echo order. Aware ECG will be completed at office visit. She is too call back if having issues. Will forward to dr Harrington Challenger to review and advise.

## 2016-12-01 NOTE — Telephone Encounter (Signed)
Patient calling, wanted to r/s appt with Dr. Harrington Challenger sooner than 01-20-17, I offered the 12-07-16 pt declined and also declined to see PA.  Patient states that Dr. Harrington Challenger always orders an EKG and a ECHO and she would like to know if Dr. Harrington Challenger could put order in now so she can have tests completed before August. Thanks.

## 2016-12-02 NOTE — Telephone Encounter (Signed)
Please put order in for echo priort to clinic visit

## 2016-12-02 NOTE — Telephone Encounter (Signed)
-----   Message from Verdene Rio sent at 12/02/2016 11:25 AM EDT ----- Regarding: RE: echo a week or so prior to 8/24 OV wtih Dr Harrington Challenger, per Dr Harrington Challenger Darcella Cheshire, Ms. Beckstrom is scheduled for 7/17 at 8:30. 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 ----- Message ----- From: Jeanie Sewer Sent: 12/02/2016  10:06 AM To: Verdene Rio Subject: FW: echo a week or so prior to 8/24 OV wtih #    ----- Message ----- From: Nuala Alpha, LPN Sent: 02/03/8477   9:35 AM To: [facility] Subject: echo a week or so prior to 8/24 OV wtih Dr R#  This pt will see Dr Harrington Challenger on 01/20/17, and will need an echo a week or so prior too this office visit.  This was ordered per Dr Harrington Challenger.  Order in the system and pt aware that you guys will call her back to arrange her echo appt, prior too her 8/24 OV with Ross.  Can you please schedule and send the echo date to Georga Hacking.  Thanks,   [facility]

## 2016-12-02 NOTE — Telephone Encounter (Signed)
Order for this pt to have an echo prior too seeing Dr Harrington Challenger on 8/24, placed into the system, and will be sent to [facility] scheduling for follow-up with the pt on making this appt. EKG to be done, added to this pts OV appt with Dr Harrington Challenger on 01/20/17.

## 2016-12-13 ENCOUNTER — Ambulatory Visit (HOSPITAL_COMMUNITY): Payer: Managed Care, Other (non HMO) | Attending: Cardiology

## 2016-12-13 ENCOUNTER — Other Ambulatory Visit: Payer: Self-pay

## 2016-12-13 DIAGNOSIS — I071 Rheumatic tricuspid insufficiency: Secondary | ICD-10-CM | POA: Insufficient documentation

## 2016-12-13 DIAGNOSIS — R600 Localized edema: Secondary | ICD-10-CM

## 2016-12-13 DIAGNOSIS — M359 Systemic involvement of connective tissue, unspecified: Secondary | ICD-10-CM | POA: Diagnosis not present

## 2016-12-18 ENCOUNTER — Encounter: Payer: Self-pay | Admitting: Internal Medicine

## 2017-01-13 ENCOUNTER — Ambulatory Visit: Payer: Managed Care, Other (non HMO) | Admitting: Family Medicine

## 2017-01-20 ENCOUNTER — Ambulatory Visit: Payer: BLUE CROSS/BLUE SHIELD | Admitting: Internal Medicine

## 2017-04-14 ENCOUNTER — Ambulatory Visit: Payer: Managed Care, Other (non HMO) | Admitting: Family Medicine

## 2017-05-24 ENCOUNTER — Ambulatory Visit: Payer: Managed Care, Other (non HMO) | Admitting: Family Medicine

## 2017-05-30 HISTORY — PX: BREAST BIOPSY: SHX20

## 2017-05-30 HISTORY — PX: BREAST CYST ASPIRATION: SHX578

## 2017-08-04 DIAGNOSIS — L738 Other specified follicular disorders: Secondary | ICD-10-CM | POA: Diagnosis not present

## 2017-08-04 DIAGNOSIS — L910 Hypertrophic scar: Secondary | ICD-10-CM | POA: Diagnosis not present

## 2017-09-18 ENCOUNTER — Telehealth: Payer: Self-pay | Admitting: *Deleted

## 2017-09-18 DIAGNOSIS — R0681 Apnea, not elsewhere classified: Secondary | ICD-10-CM

## 2017-09-18 NOTE — Telephone Encounter (Signed)
Left message for pt to call back.  Need to relay Dr. G's message.  

## 2017-09-18 NOTE — Telephone Encounter (Signed)
We can discuss this at her physical next month. Have her remind [place].

## 2017-09-18 NOTE — Addendum Note (Signed)
Addended by: Ria Bush on: 09/18/2017 06:22 PM   Modules accepted: Orders

## 2017-09-18 NOTE — Telephone Encounter (Signed)
Patient called said I gave her the message from Dr. Danise Mina. But patient does want to add that this happens every night that she falls a sleep and that she snores also. Please advised.

## 2017-09-18 NOTE — Telephone Encounter (Signed)
Copied from [place] [phone]. 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 Please advise. Patient has a physical on 10/16/17.

## 2017-09-18 NOTE — Telephone Encounter (Signed)
pulm referral placed.

## 2017-09-19 ENCOUNTER — Encounter (INDEPENDENT_AMBULATORY_CARE_PROVIDER_SITE_OTHER): Payer: Self-pay

## 2017-09-25 ENCOUNTER — Encounter: Payer: Self-pay | Admitting: Internal Medicine

## 2017-09-25 ENCOUNTER — Ambulatory Visit: Payer: 59 | Admitting: Internal Medicine

## 2017-09-25 VITALS — BP 98/60 | HR 89 | Resp 16 | Ht 70.0 in | Wt 227.0 lb

## 2017-09-25 DIAGNOSIS — G4719 Other hypersomnia: Secondary | ICD-10-CM

## 2017-09-25 DIAGNOSIS — K219 Gastro-esophageal reflux disease without esophagitis: Secondary | ICD-10-CM | POA: Diagnosis not present

## 2017-09-25 NOTE — Progress Notes (Signed)
[facility] Consultation      Assessment and Plan:  Excessive daytime sleepiness. -Symptoms and signs of obstructive sleep apnea, with obesity. - We will send for sleep study.  GERD. - Can be contributed to by sleep apnea. -Continue PPI.  Dyspnea. - Reviewed previous cardiopulmonary exercise testing, showed normal pulmonary functions. - Continue activity as tolerated.  Chronic rhinitis. - Likely allergy related. - Continue antihistamine, can consider nasal steroid as indicated.  Orders Placed This Encounter  Procedures  . Home sleep test   Return in about 3 months (around 12/25/2017).    Date: 09/25/2017  MRN# [id number] Heather Stewart 01/31/1986   Heather Stewart is a 32 y.o. old female seen in consultation for chief complaint of:    Chief Complaint  Patient presents with  . Consult    Referred by Dr. Danise Mina for eval of OSA:  . Snoring    witnessed  . Apnea    witnessed 3-5 seconds    HPI:   She has gained about 30 lbs over the past 2 years. She works as a travel Marine scientist and a friend told her that she snores loudly in her sleep, and that she stops breathing in her sleep.  She has occasional dyspena with exercise such as walking at fast pace or climbing steps, this has gotten worse since she gained weight. She has had a PFT and stress test a few years ago because of dyspnea and chest pain.  She is sleepy all the time. 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  Her father has OSA and is on CPAP.  She has jaw popping with opening mouth fully and has gotten locked in the past. She no longer chews gum because of locking and clicking.   Has reflux and takes protonix, also takes zyrtec for nasal allergies.   **CPET; FVC 112%; FEV1 98%, ratio was 88%; Vo2 max of 96%; o2 pulse 114%   PMHX:   Past Medical History:  Diagnosis Date  . ADHD (attention deficit hyperactivity disorder), combined type    dx as child and  then received through psychiatry - reviewed records from psych: no recent w/u.  was on vyvanse during nursing school  . Adjustment disorder   . Chronic back pain    thoracic and lumbar - s/p unrevealing NSG eval   . Connective tissue disease ([place])    ocular lens dislocation, flat feet, high palate, no cardiac involvement  . Esophageal reflux   . Eye problems 08/2011   subluxated lenses OU, rec test for homocyetinuria to r/o as cause (see scanned form)  . Hx of migraines   . Irritable bowel syndrome   . MDD (major depressive disorder), recurrent episode ([place])   . Screening examination for pulmonary tuberculosis   . Swelling, mass, or lump in head and neck    Surgical Hx:  Past Surgical History:  Procedure Laterality Date  . FOOT SURGERY     Right-glass removed in OR  . [place], [place]  . GANGLION CYST EXCISION     Left wrist  . MYRINGOTOMY     Family Hx:  Family History  Problem Relation Age of Onset  . Alcohol abuse Father   . Other Father        Spinal problem  . Migraines Mother   . Coronary artery disease Unknown        GF 5v bypass  . COPD Paternal Grandfather   .  Breast cancer Maternal Aunt        x2  . Cancer Brother        HALF brother (had kidney removed at 6 months-then died of cancer)  . Breast cancer Unknown        Aunts x3  . Heart disease Unknown        MGF, PGF, GGM  . Kidney disease Unknown        GGM  . Liver cancer Brother    Social Hx:   Social History   Tobacco Use  . Smoking status: Never Smoker  . Smokeless tobacco: Never Used  Substance Use Topics  . Alcohol use: Yes    Comment: occ wine  . Drug use: No   Medication:    Current Outpatient Medications:  .  ALPRAZolam (XANAX) 0.5 MG tablet, Take 0.5 mg by mouth as needed for anxiety (for anxiety). , Disp: , Rfl:  .  cetirizine (ZYRTEC) 10 MG tablet, Take 10 mg by mouth 2 (two) times daily., Disp: , Rfl:  .  DULoxetine (CYMBALTA) 60 MG capsule, Take 60 mg by mouth 2 (two)  times daily., Disp: , Rfl:  .  ondansetron (ZOFRAN) 4 MG tablet, Take 1 tablet by mouth as needed., Disp: , Rfl:  .  pantoprazole (PROTONIX) 40 MG tablet, Take 40 mg by mouth daily., Disp: , Rfl: 6 .  QUEtiapine (SEROQUEL) 50 MG tablet, Take 1 tablet by mouth at bedtime., Disp: , Rfl:  .  spironolactone (ALDACTONE) 50 MG tablet, Take 50 mg by mouth 2 (two) times daily., Disp: , Rfl: 0   Allergies:  Amoxicillin-pot clavulanate; Bacitracin-polymyxin b; Neomycin-bacitracin zn-polymyx; Bacitracin; Doxycycline; Neomycin; and Vyvanse [lisdexamfetamine dimesylate]  Review of Systems: Gen:  Denies  fever, sweats, chills HEENT: Denies blurred vision, double vision. bleeds, sore throat Cvc:  No dizziness, chest pain. Resp:   Denies cough or sputum production, shortness of breath Gi: Denies swallowing difficulty, stomach pain. Gu:  Denies bladder incontinence, burning urine Ext:   No Joint pain, stiffness. Skin: No skin rash,  hives  Endoc:  No polyuria, polydipsia. Psych: No depression, insomnia. Other:  All other systems were reviewed with the patient and were negative other that what is mentioned in the HPI.   Physical Examination:   VS: BP 98/60 (BP Location: Left Arm, Cuff Size: Normal)   Pulse 89   Resp 16   Ht 5\' 10"  (1.778 m)   Wt 227 lb (103 kg)   SpO2 97%   BMI 32.57 kg/m   General Appearance: No distress  Neuro:without focal findings,  speech normal,  HEENT: PERRLA, EOM intact.   Pulmonary: normal breath sounds, No wheezing.  CardiovascularNormal S1,S2.  No m/r/g.   Abdomen: Benign, Soft, non-tender. Renal:  No costovertebral tenderness  GU:  No performed at this time. Endoc: No evident thyromegaly, no signs of acromegaly. Skin:   warm, no rashes, no ecchymosis  Extremities: normal, no cyanosis, clubbing.  Other findings:    LABORATORY PANEL:   CBC No results for input(s): WBC, HGB, HCT, PLT in the last 168  hours. ------------------------------------------------------------------------------------------------------------------  Chemistries  No results for input(s): NA, K, CL, CO2, GLUCOSE, BUN, CREATININE, CALCIUM, MG, AST, ALT, ALKPHOS, BILITOT in the last 168 hours.  Invalid input(s): GFRCGP ------------------------------------------------------------------------------------------------------------------  Cardiac Enzymes No results for input(s): TROPONINI in the last 168 hours. ------------------------------------------------------------  RADIOLOGY:  No results found.     Thank  you for the consultation and for allowing [facility] to assist  in the care of your patient. Our recommendations are noted above.  Please contact us if we can be of further service.   Marda Stalker, MD.  Board Certified in Internal Medicine, Pulmonary Medicine, [place], and Sleep Medicine.  [facility] Office Number: [phone]  Patricia Pesa, M.D.  Merton Border, M.D  09/25/2017

## 2017-09-25 NOTE — Patient Instructions (Signed)
--

## 2017-09-26 ENCOUNTER — Institutional Professional Consult (permissible substitution): Payer: Managed Care, Other (non HMO) | Admitting: Internal Medicine

## 2017-10-16 ENCOUNTER — Encounter: Payer: Managed Care, Other (non HMO) | Admitting: Family Medicine

## 2017-11-14 ENCOUNTER — Encounter

## 2017-11-14 ENCOUNTER — Encounter: Payer: 59 | Admitting: Family Medicine

## 2017-11-24 DIAGNOSIS — M5431 Sciatica, right side: Secondary | ICD-10-CM | POA: Diagnosis not present

## 2017-11-24 DIAGNOSIS — M9902 Segmental and somatic dysfunction of thoracic region: Secondary | ICD-10-CM | POA: Diagnosis not present

## 2017-11-24 DIAGNOSIS — M9903 Segmental and somatic dysfunction of lumbar region: Secondary | ICD-10-CM | POA: Diagnosis not present

## 2017-12-05 DIAGNOSIS — M9902 Segmental and somatic dysfunction of thoracic region: Secondary | ICD-10-CM | POA: Diagnosis not present

## 2017-12-05 DIAGNOSIS — M9903 Segmental and somatic dysfunction of lumbar region: Secondary | ICD-10-CM | POA: Diagnosis not present

## 2017-12-05 DIAGNOSIS — M5431 Sciatica, right side: Secondary | ICD-10-CM | POA: Diagnosis not present

## 2017-12-20 ENCOUNTER — Telehealth: Payer: Self-pay | Admitting: Internal Medicine

## 2017-12-20 NOTE — Telephone Encounter (Signed)
Patient calling to schedule Home Sleep test . Will be losing insurance

## 2017-12-21 NOTE — Telephone Encounter (Signed)
Pt had been contacted x 3 in May but never returned calls. Pt has been scheduled to p/u device on 01/18/18 between 3:30-4:30. Pt voiced understanding.  Nothing else needed at this time. Rhonda J Cobb

## 2018-01-10 DIAGNOSIS — Z6833 Body mass index (BMI) 33.0-33.9, adult: Secondary | ICD-10-CM | POA: Diagnosis not present

## 2018-01-10 DIAGNOSIS — Z808 Family history of malignant neoplasm of other organs or systems: Secondary | ICD-10-CM | POA: Diagnosis not present

## 2018-01-10 DIAGNOSIS — Z8051 Family history of malignant neoplasm of kidney: Secondary | ICD-10-CM | POA: Diagnosis not present

## 2018-01-10 DIAGNOSIS — Z01419 Encounter for gynecological examination (general) (routine) without abnormal findings: Secondary | ICD-10-CM | POA: Diagnosis not present

## 2018-01-10 DIAGNOSIS — Z803 Family history of malignant neoplasm of breast: Secondary | ICD-10-CM | POA: Diagnosis not present

## 2018-01-18 ENCOUNTER — Encounter: Payer: Self-pay | Admitting: Internal Medicine

## 2018-01-18 DIAGNOSIS — G4719 Other hypersomnia: Secondary | ICD-10-CM

## 2018-01-22 ENCOUNTER — Telehealth: Payer: Self-pay | Admitting: Internal Medicine

## 2018-01-22 DIAGNOSIS — G4733 Obstructive sleep apnea (adult) (pediatric): Secondary | ICD-10-CM

## 2018-01-22 NOTE — Telephone Encounter (Signed)
Pt is aware of results and order placed for CPAP set up. Pt aware that DME will contact her for set up. Pt will also contact our office to set up OV once she has CPAP in hand-aware that she must be seen within 31-90 days of CPAP set up. Nothing more needed at this time.

## 2018-01-30 DIAGNOSIS — R87612 Low grade squamous intraepithelial lesion on cytologic smear of cervix (LGSIL): Secondary | ICD-10-CM | POA: Diagnosis not present

## 2018-02-09 DIAGNOSIS — H16222 Keratoconjunctivitis sicca, not specified as Sjogren's, left eye: Secondary | ICD-10-CM | POA: Diagnosis not present

## 2018-03-30 HISTORY — PX: BREAST LUMPECTOMY: SHX2

## 2018-04-09 DIAGNOSIS — Z809 Family history of malignant neoplasm, unspecified: Secondary | ICD-10-CM | POA: Diagnosis not present

## 2018-04-09 DIAGNOSIS — Z1231 Encounter for screening mammogram for malignant neoplasm of breast: Secondary | ICD-10-CM | POA: Diagnosis not present

## 2018-04-10 ENCOUNTER — Ambulatory Visit: Payer: 59 | Admitting: Podiatry

## 2018-04-10 ENCOUNTER — Encounter: Payer: Self-pay | Admitting: Podiatry

## 2018-04-10 DIAGNOSIS — J209 Acute bronchitis, unspecified: Secondary | ICD-10-CM | POA: Insufficient documentation

## 2018-04-10 DIAGNOSIS — R059 Cough, unspecified: Secondary | ICD-10-CM | POA: Insufficient documentation

## 2018-04-10 DIAGNOSIS — R051 Acute cough: Secondary | ICD-10-CM | POA: Insufficient documentation

## 2018-04-10 DIAGNOSIS — M722 Plantar fascial fibromatosis: Secondary | ICD-10-CM | POA: Diagnosis not present

## 2018-04-10 DIAGNOSIS — L600 Ingrowing nail: Secondary | ICD-10-CM

## 2018-04-10 DIAGNOSIS — R05 Cough: Secondary | ICD-10-CM | POA: Insufficient documentation

## 2018-04-10 MED ORDER — MELOXICAM 15 MG PO TABS: 15.0000 mg | ORAL_TABLET | Freq: Every day | ORAL | 1 refills | Status: AC

## 2018-04-10 MED ORDER — METHYLPREDNISOLONE 4 MG PO TBPK: ORAL_TABLET | ORAL | 0 refills | Status: DC

## 2018-04-10 MED ORDER — GENTAMICIN SULFATE 0.1 % EX CREA: 1.0000 "application " | TOPICAL_CREAM | Freq: Two times a day (BID) | CUTANEOUS | 1 refills | Status: DC

## 2018-04-10 NOTE — Patient Instructions (Signed)

## 2018-04-10 NOTE — Progress Notes (Signed)
Subjective: Patient presents today for evaluation of pain to the medial border of the right hallux that began a few years ago. Patient is concerned for possible ingrown nail. Wearing shoes for long periods of time increases the pain. She has not done anything for treatment at home but reports having a nail avulsion procedure on two different occasions in the past.  She also reports a flare up of plantar fasciitis of the left heel that began 2-3 months ago. She states the pain is greatest when she first gets out of bed in the morning. She has not done anything for treatment. Standing or walking increases the pain. Patient presents today for further treatment and evaluation.  Past Medical History:  Diagnosis Date  . ADHD (attention deficit hyperactivity disorder), combined type    dx as child and then received through psychiatry - reviewed records from psych: no recent w/u.  was on vyvanse during nursing school  . Adjustment disorder   . Chronic back pain    thoracic and lumbar - s/p unrevealing NSG eval   . Connective tissue disease ([place])    ocular lens dislocation, flat feet, high palate, no cardiac involvement  . Esophageal reflux   . Eye problems 08/2011   subluxated lenses OU, rec test for homocyetinuria to r/o as cause (see scanned form)  . Hx of migraines   . Irritable bowel syndrome   . MDD (major depressive disorder), recurrent episode ([place])   . Screening examination for pulmonary tuberculosis   . Swelling, mass, or lump in head and neck     Objective:  General: Well developed, nourished, in no acute distress, alert and oriented x3   Dermatology: Skin is warm, dry and supple bilateral. Medial border of the right hallux appears to be erythematous with evidence of an ingrowing nail. Pain on palpation noted to the border of the nail fold. The remaining nails appear unremarkable at this time. There are no open sores, lesions.  Vascular: Dorsalis Pedis artery and Posterior Tibial  artery pedal pulses palpable. No lower extremity edema noted.   Neruologic: Grossly intact via light touch bilateral.  Musculoskeletal: Tenderness to palpation to the plantar aspect of the left heel along the plantar fascia. Muscular strength within normal limits in all groups bilateral. Normal range of motion noted to all pedal and ankle joints.   Assesement: #1 Paronychia with ingrowing nail medial border right hallux #2 Pain in toe #3 Incurvated nail #4 Plantar fasciitis left   Plan of Care:  1. Patient evaluated.  2. Discussed treatment alternatives and plan of care. Explained nail avulsion procedure and post procedure course to patient. 3. Patient opted for permanent partial nail avulsion of the medial border of the right hallux.  4. Prior to procedure, local anesthesia infiltration utilized using 3 ml of a 50:50 mixture of 2% plain lidocaine and 0.5% plain marcaine in a normal hallux block fashion and a betadine prep performed.  5. Partial permanent nail avulsion with chemical matrixectomy performed using [id number] applications of phenol followed by alcohol flush.  6. Light dressing applied. 7. Prescription for Medrol Dose Pak provided to patient.  8. Prescription for Meloxicam provided to patient.  9. Prescription for Gentamicin cream provided to patient.  10. Recommended good shoe gear.  11. Return to clinic in 4 weeks.   Edrick Kins, DPM [facility]  Dr. Edrick Kins, DPM    [address]  [place], [place] [address]                Office [phone]  Fax [phone]

## 2018-04-11 ENCOUNTER — Other Ambulatory Visit: Payer: Self-pay | Admitting: Obstetrics & Gynecology

## 2018-04-11 DIAGNOSIS — R928 Other abnormal and inconclusive findings on diagnostic imaging of breast: Secondary | ICD-10-CM

## 2018-04-16 ENCOUNTER — Other Ambulatory Visit: Payer: Self-pay | Admitting: Obstetrics & Gynecology

## 2018-04-16 ENCOUNTER — Ambulatory Visit
Admission: RE | Admit: 2018-04-16 | Discharge: 2018-04-16 | Disposition: A | Discharge status: Home / Self Care | Payer: 59 | Source: Ambulatory Visit | Attending: Obstetrics & Gynecology | Admitting: Obstetrics & Gynecology

## 2018-04-16 DIAGNOSIS — Z803 Family history of malignant neoplasm of breast: Secondary | ICD-10-CM | POA: Diagnosis not present

## 2018-04-16 DIAGNOSIS — N6489 Other specified disorders of breast: Secondary | ICD-10-CM | POA: Diagnosis not present

## 2018-04-16 DIAGNOSIS — R928 Other abnormal and inconclusive findings on diagnostic imaging of breast: Secondary | ICD-10-CM

## 2018-04-16 DIAGNOSIS — N6001 Solitary cyst of right breast: Secondary | ICD-10-CM

## 2018-04-16 DIAGNOSIS — R922 Inconclusive mammogram: Secondary | ICD-10-CM | POA: Diagnosis not present

## 2018-04-16 DIAGNOSIS — N6311 Unspecified lump in the right breast, upper outer quadrant: Secondary | ICD-10-CM | POA: Diagnosis not present

## 2018-04-18 ENCOUNTER — Ambulatory Visit
Admission: RE | Admit: 2018-04-18 | Discharge: 2018-04-18 | Disposition: A | Discharge status: Home / Self Care | Payer: 59 | Source: Ambulatory Visit | Attending: Obstetrics & Gynecology | Admitting: Obstetrics & Gynecology

## 2018-04-18 ENCOUNTER — Other Ambulatory Visit: Payer: Self-pay | Admitting: Obstetrics & Gynecology

## 2018-04-18 DIAGNOSIS — N6001 Solitary cyst of right breast: Secondary | ICD-10-CM

## 2018-04-18 DIAGNOSIS — N6011 Diffuse cystic mastopathy of right breast: Secondary | ICD-10-CM | POA: Diagnosis not present

## 2018-04-18 DIAGNOSIS — N6311 Unspecified lump in the right breast, upper outer quadrant: Secondary | ICD-10-CM | POA: Diagnosis not present

## 2018-04-25 ENCOUNTER — Ambulatory Visit: Payer: 59 | Admitting: Podiatry

## 2018-05-08 ENCOUNTER — Ambulatory Visit: Payer: 59 | Admitting: Podiatry

## 2018-05-14 ENCOUNTER — Encounter: Payer: Self-pay | Admitting: Family Medicine

## 2018-05-14 DIAGNOSIS — Z1589 Genetic susceptibility to other disease: Secondary | ICD-10-CM | POA: Insufficient documentation

## 2018-05-14 DIAGNOSIS — Z1501 Genetic susceptibility to malignant neoplasm of breast: Secondary | ICD-10-CM | POA: Insufficient documentation

## 2018-05-14 DIAGNOSIS — Z1509 Genetic susceptibility to other malignant neoplasm: Secondary | ICD-10-CM

## 2018-07-29 HISTORY — PX: ESOPHAGOGASTRODUODENOSCOPY: SHX1529

## 2018-08-15 ENCOUNTER — Encounter: Payer: Self-pay | Admitting: Family Medicine

## 2018-08-22 ENCOUNTER — Encounter: Payer: Self-pay | Admitting: Family Medicine

## 2018-08-29 DIAGNOSIS — R300 Dysuria: Secondary | ICD-10-CM | POA: Diagnosis not present

## 2018-09-20 ENCOUNTER — Other Ambulatory Visit: Payer: Self-pay | Admitting: Obstetrics & Gynecology

## 2018-09-20 DIAGNOSIS — Z803 Family history of malignant neoplasm of breast: Secondary | ICD-10-CM

## 2018-10-02 ENCOUNTER — Other Ambulatory Visit: Payer: Self-pay

## 2018-10-02 ENCOUNTER — Ambulatory Visit (INDEPENDENT_AMBULATORY_CARE_PROVIDER_SITE_OTHER): Payer: 59 | Admitting: Family Medicine

## 2018-10-02 ENCOUNTER — Encounter: Payer: Self-pay | Admitting: Family Medicine

## 2018-10-02 VITALS — Ht 70.0 in | Wt 225.0 lb

## 2018-10-02 DIAGNOSIS — M359 Systemic involvement of connective tissue, unspecified: Secondary | ICD-10-CM

## 2018-10-02 DIAGNOSIS — K122 Cellulitis and abscess of mouth: Secondary | ICD-10-CM

## 2018-10-02 HISTORY — DX: Cellulitis and abscess of mouth: K12.2

## 2018-10-02 MED ORDER — AZITHROMYCIN 250 MG PO TABS: ORAL_TABLET | ORAL | 0 refills | Status: DC

## 2018-10-02 NOTE — Progress Notes (Signed)
Virtual visit completed through [employer]. Due to national recommendations of social distancing due to Epes 19, a virtual visit is felt to be most appropriate for this patient at this time.   Patient location: home Provider location: [place] at [facility], office If any vitals were documented, they were collected by patient at home unless specified below.    Ht 5\' 10"  (1.778 m)   Wt 225 lb (102.1 kg)   LMP 09/25/2018   BMI 32.28 kg/m    CC: swollen uvula  Subjective:    Patient ID: Heather Stewart, female    DOB: Mar 05, 1986, 33 y.o.   MRN: [id number]  HPI: Heather Stewart is a 33 y.o. female presenting on 10/02/2018 for Sore Throat (C/o sore throat with redness and swelling of the uvula. Started 3 days ago. Tried gargling with warm salt water, not helpful. Also, tried ibuprofen. )   I last saw patient 06/2014, last seen by our office 03/2016.  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   Flew home April 30th - was working in [place] - planning to return tomorrow.  5d ago noted swelling of uvula and roof of mouth without pain. It hurts to swallow. Feels uvula touching back of tongue - causing gagging. Also notes redness of uvula and white spot on uvula. Increasing fatigue noted as well - but this has been more chronic in nature. Notes some swollen glands. Ibuprofen 400mg  bid (limited by GI irritation), tylenol has also helped pain.   No fevers/chills, cough, dyspnea, changes to taste/smell. No congestion or rhinorrhea, HA, body aches. No tongue or lip swelling. No abd pain, nausea/vomiting, rashes.   She regularly takes allergy medication (zyrtec and claritin).   OSA noted 12/2017 s/p pulm eval - started on CPAP auto treatment 5-15.     Relevant past medical, surgical, family and social history reviewed and updated as indicated. Interim medical history since our last visit reviewed. Allergies and medications reviewed and updated.  Outpatient Medications Prior to Visit  Medication Sig Dispense Refill  . ALPRAZolam (XANAX) 1 MG tablet Take 1 mg by mouth 3 (three) times daily as needed.   5  . cetirizine (ZYRTEC) 10 MG tablet Take 10 mg by mouth at bedtime.     . DULoxetine (CYMBALTA) 60 MG capsule Take 60 mg by mouth 2 (two) times daily.    Marland Kitchen loratadine (CLARITIN) 10 MG tablet Take 10 mg by mouth daily. In the morning    . ondansetron (ZOFRAN) 4 MG tablet Take 1 tablet by mouth as needed.    Marland Kitchen QUEtiapine (SEROQUEL) 50 MG tablet Take 1 tablet by mouth at bedtime.    Marland Kitchen spironolactone (ALDACTONE) 50 MG tablet Take 50 mg by mouth 2 (two) times daily.  0  . VYVANSE 40 MG capsule Take 40 mg by mouth every morning.  0  . pantoprazole (PROTONIX) 40 MG tablet Take 1 tablet (40 mg total) by mouth daily.    Marland Kitchen ALPRAZolam (XANAX) 0.5 MG tablet Take 0.5 mg by mouth as needed for anxiety (for anxiety).     Marland Kitchen buPROPion (WELLBUTRIN XL) 300 MG 24 hr tablet Take 300 mg by mouth every morning.  1  . cyclobenzaprine (FLEXERIL) 5 MG tablet TAKE 1-2 TABLETS BY MOUTH AT BEDTIME AS NEEDED FOR PAIN  0  . gentamicin cream (GARAMYCIN) 0.1 % Apply 1 application topically 2 (two) times daily. 30 g 1  . methylPREDNISolone (MEDROL DOSEPAK) 4 MG TBPK  tablet 6 day dose pack - take as directed 21 tablet 0  . pantoprazole (PROTONIX) 40 MG tablet Take 40 mg by mouth daily.  6   No facility-administered medications prior to visit.      Per HPI unless specifically indicated in ROS section below Review of Systems Objective:    Ht 5\' 10"  (1.778 m)   Wt 225 lb (102.1 kg)   LMP 09/25/2018   BMI 32.28 kg/m   Wt Readings from Last 3 Encounters:  10/02/18 225 lb (102.1 kg)  09/25/17 227 lb (103 kg)  11/12/16 219 lb (99.3 kg)     Physical exam: Gen: alert, NAD, not ill appearing Pulm: speaks in complete sentences without increased work of breathing Psych: normal mood, normal thought content  HEENT - tongue not swollen. Limited visualization of  posterior oropharynx due to poor lighting, R side of uvula seems swollen compared to left      Assessment & Plan:   Problem List Items Addressed This Visit    Uvulitis - Primary    Several abx allergies. Will cover for bacterial cause with azithromycin course. Update if not improving with treatment. Continue limited NSAID + tylenol for discomfort. rec supportive care with salt water gargles, honey with lemon, herbal teas or popsicles. Update if not improving with treatment. Pt agrees with plan.       Connective tissue disorder ([place])       Meds ordered this encounter  Medications  . azithromycin (ZITHROMAX) 250 MG tablet    Sig: Take two tablets on day one followed by one tablet on days 2-5    Dispense:  6 each    Refill:  0    For uvulitis   No orders of the defined types were placed in this encounter.   Follow up plan: No follow-ups on file.  Ria Bush, MD

## 2018-10-02 NOTE — Assessment & Plan Note (Signed)
Several abx allergies. Will cover for bacterial cause with azithromycin course. Update if not improving with treatment. Continue limited NSAID + tylenol for discomfort. rec supportive care with salt water gargles, honey with lemon, herbal teas or popsicles. Update if not improving with treatment. Pt agrees with plan.

## 2018-10-03 ENCOUNTER — Encounter: Payer: Self-pay | Admitting: Family Medicine

## 2018-10-03 NOTE — Telephone Encounter (Signed)
Pt calling back to see if she can get work note she wants to go back to work tomorrow.  Work is requesting she get a note since she had a sore throat

## 2018-10-29 ENCOUNTER — Telehealth: Payer: Self-pay | Admitting: Family Medicine

## 2018-10-29 DIAGNOSIS — Z20822 Contact with and (suspected) exposure to covid-19: Secondary | ICD-10-CM

## 2018-10-29 DIAGNOSIS — J029 Acute pharyngitis, unspecified: Secondary | ICD-10-CM

## 2018-10-29 DIAGNOSIS — M359 Systemic involvement of connective tissue, unspecified: Secondary | ICD-10-CM

## 2018-10-29 DIAGNOSIS — E781 Pure hyperglyceridemia: Secondary | ICD-10-CM

## 2018-10-29 DIAGNOSIS — Z20828 Contact with and (suspected) exposure to other viral communicable diseases: Secondary | ICD-10-CM

## 2018-10-29 DIAGNOSIS — E785 Hyperlipidemia, unspecified: Secondary | ICD-10-CM | POA: Insufficient documentation

## 2018-10-29 NOTE — Telephone Encounter (Signed)
Pt schedule cpx labs on 6/3 and wanted to know if you can do labs for antibodies for covid19 and quantiferon blood tb test   She is travel er Marine scientist.   Best number [phone]

## 2018-10-29 NOTE — Telephone Encounter (Signed)
Spoke with pt informing her Dr. Darnell Level ordered the labs she requested.

## 2018-10-29 NOTE — Telephone Encounter (Signed)
Labs ordered.

## 2018-10-31 ENCOUNTER — Other Ambulatory Visit (INDEPENDENT_AMBULATORY_CARE_PROVIDER_SITE_OTHER): Payer: 59

## 2018-10-31 DIAGNOSIS — Z20828 Contact with and (suspected) exposure to other viral communicable diseases: Secondary | ICD-10-CM

## 2018-10-31 DIAGNOSIS — E781 Pure hyperglyceridemia: Secondary | ICD-10-CM

## 2018-10-31 DIAGNOSIS — Z111 Encounter for screening for respiratory tuberculosis: Secondary | ICD-10-CM

## 2018-10-31 DIAGNOSIS — J029 Acute pharyngitis, unspecified: Secondary | ICD-10-CM | POA: Diagnosis not present

## 2018-10-31 DIAGNOSIS — Z20822 Contact with and (suspected) exposure to covid-19: Secondary | ICD-10-CM

## 2018-10-31 LAB — LIPID PANEL
Cholesterol: 196 mg/dL (ref 0–200)
HDL: 34.7 mg/dL — ABNORMAL LOW (ref >39.00)
NonHDL: 161.08
Total CHOL/HDL Ratio: 6
Triglycerides: 278 mg/dL — ABNORMAL HIGH (ref 0.0–149.0)
VLDL: 55.6 mg/dL — ABNORMAL HIGH (ref 0.0–40.0)

## 2018-10-31 LAB — TSH: TSH: 3.16 u[IU]/mL (ref 0.35–4.50)

## 2018-10-31 LAB — CBC WITH DIFFERENTIAL/PLATELET
Basophils Absolute: 0.1 10*3/uL (ref 0.0–0.1)
Basophils Relative: 0.8 % (ref 0.0–3.0)
Eosinophils Absolute: 0.1 10*3/uL (ref 0.0–0.7)
Eosinophils Relative: 1.2 % (ref 0.0–5.0)
HCT: 40.9 % (ref 36.0–46.0)
Hemoglobin: 14.1 g/dL (ref 12.0–15.0)
Lymphocytes Relative: 24 % (ref 12.0–46.0)
Lymphs Abs: 1.7 10*3/uL (ref 0.7–4.0)
MCHC: 34.6 g/dL (ref 30.0–36.0)
MCV: 88.4 fl (ref 78.0–100.0)
Monocytes Absolute: 0.4 10*3/uL (ref 0.1–1.0)
Monocytes Relative: 5.3 % (ref 3.0–12.0)
Neutro Abs: 5 10*3/uL (ref 1.4–7.7)
Neutrophils Relative %: 68.7 % (ref 43.0–77.0)
Platelets: 262 10*3/uL (ref 150.0–400.0)
RBC: 4.62 Mil/uL (ref 3.87–5.11)
RDW: 13.4 % (ref 11.5–15.5)
WBC: 7.2 10*3/uL (ref 4.0–10.5)

## 2018-10-31 LAB — SAR COV2 SEROLOGY (COVID19)AB(IGG),IA: SARS CoV2 AB IGG: NEGATIVE

## 2018-10-31 LAB — COMPREHENSIVE METABOLIC PANEL
ALT: 11 U/L (ref 0–35)
AST: 12 U/L (ref 0–37)
Albumin: 3.9 g/dL (ref 3.5–5.2)
Alkaline Phosphatase: 59 U/L (ref 39–117)
BUN: 14 mg/dL (ref 6–23)
CO2: 27 mEq/L (ref 19–32)
Calcium: 9.3 mg/dL (ref 8.4–10.5)
Chloride: 100 mEq/L (ref 96–112)
Creatinine, Ser: 0.82 mg/dL (ref 0.40–1.20)
GFR: 80.68 mL/min (ref >60.00)
Glucose, Bld: 91 mg/dL (ref 70–99)
Potassium: 4 mEq/L (ref 3.5–5.1)
Sodium: 134 mEq/L — ABNORMAL LOW (ref 135–145)
Total Bilirubin: 0.7 mg/dL (ref 0.2–1.2)
Total Protein: 7 g/dL (ref 6.0–8.3)

## 2018-10-31 LAB — LDL CHOLESTEROL, DIRECT: Direct LDL: 129 mg/dL

## 2018-10-31 NOTE — Addendum Note (Signed)
Addended by: Ellamae Sia on: 10/31/2018 11:35 AM   Modules accepted: Orders

## 2018-11-01 ENCOUNTER — Encounter: Payer: Self-pay | Admitting: Family Medicine

## 2018-11-01 ENCOUNTER — Ambulatory Visit (INDEPENDENT_AMBULATORY_CARE_PROVIDER_SITE_OTHER): Payer: 59 | Admitting: Family Medicine

## 2018-11-01 ENCOUNTER — Other Ambulatory Visit: Payer: Self-pay

## 2018-11-01 VITALS — BP 118/64 | HR 91 | Temp 98.6°F | Ht 69.0 in | Wt 221.0 lb

## 2018-11-01 DIAGNOSIS — M359 Systemic involvement of connective tissue, unspecified: Secondary | ICD-10-CM | POA: Diagnosis not present

## 2018-11-01 DIAGNOSIS — F331 Major depressive disorder, recurrent, moderate: Secondary | ICD-10-CM

## 2018-11-01 DIAGNOSIS — E781 Pure hyperglyceridemia: Secondary | ICD-10-CM

## 2018-11-01 DIAGNOSIS — K219 Gastro-esophageal reflux disease without esophagitis: Secondary | ICD-10-CM

## 2018-11-01 DIAGNOSIS — Z0000 Encounter for general adult medical examination without abnormal findings: Secondary | ICD-10-CM

## 2018-11-01 DIAGNOSIS — J309 Allergic rhinitis, unspecified: Secondary | ICD-10-CM | POA: Insufficient documentation

## 2018-11-01 DIAGNOSIS — L709 Acne, unspecified: Secondary | ICD-10-CM | POA: Insufficient documentation

## 2018-11-01 MED ORDER — SPIRONOLACTONE 50 MG PO TABS: 50.0000 mg | ORAL_TABLET | Freq: Two times a day (BID) | ORAL | 3 refills | Status: DC

## 2018-11-01 MED ORDER — ONDANSETRON HCL 4 MG PO TABS: 4.0000 mg | ORAL_TABLET | ORAL | 0 refills | Status: DC | PRN

## 2018-11-01 MED ORDER — PANTOPRAZOLE SODIUM 40 MG PO TBEC: 40.0000 mg | DELAYED_RELEASE_TABLET | Freq: Every day | ORAL | 3 refills | Status: DC

## 2018-11-01 NOTE — Assessment & Plan Note (Signed)
On spironolactone for this.

## 2018-11-01 NOTE — Progress Notes (Signed)
This visit was conducted in person.  BP 118/64 (BP Location: Left Arm, Patient Position: Sitting, Cuff Size: Normal)   Pulse 91   Temp 98.6 F (37 C) (Oral)   Ht '5\' 9"'$  (1.753 m)   Wt 221 lb (100.2 kg)   LMP 09/26/2018   SpO2 99%   BMI 32.64 kg/m    CC: CPE Subjective:    Patient ID: Heather Stewart, female    DOB: 07/29/1985, 33 y.o.   MRN: [id number]  HPI: Heather Stewart is a 33 y.o. 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   Sees Dr Ronn Melena Psych.  H/o connective tissue disease followed by ophthalmology and cardiology Dr Harrington Challenger. Has seen pulm for excessive daytime sleepiness - home sleep study 2019 showed mild OSA. Has not tried CPAP (was unaffordable at that time).   Takes claritin and zyrtec daily for allergies.  On spironolactone for acne - takes '50mg'$  bid.  GERD - on protonix daily.   07/2018 had episodes of severe epigastric pain s/p ER evaluation with normal CTA found to have incidental liver mass - had liver MRI - focal nodular hyperplasia. Also had EGD found benign polyps and mild gastritis. This was done while in [place].   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   Odor to belly button - tried [place]. Occasional bleeding, no drainage. Cleans with alcohol swab or h2o2. Rarely tender. She did have belly button ring early 2000s.   Preventative: Well woman exam with OBGYN Dr [facility], next due 12/2018  Flu shot yearly  Td 2006, Tdap 2014  MMR booster 03/2016  Seat belt use discussed  Sunscreen use discussed. No changing moles on skin.  Non smoker  Alcohol - seldom  Dentist yearly  Eye exam regularly - last seen 07/2018   Caffeine: 1 coke occasionally Single No children Works as traveling Health visitor Activity: no regular exercise Diet: good water, fruits/vegetables daily     Relevant past medical, surgical, family and social history reviewed and updated as indicated. Interim  medical history since our last visit reviewed. Allergies and medications reviewed and updated. Outpatient Medications Prior to Visit  Medication Sig Dispense Refill  . ALPRAZolam (XANAX) 1 MG tablet Take 1 mg by mouth 3 (three) times daily as needed.   5  . cetirizine (ZYRTEC) 10 MG tablet Take 10 mg by mouth at bedtime.     . DULoxetine (CYMBALTA) 60 MG capsule Take 60 mg by mouth 2 (two) times daily.    Marland Kitchen loratadine (CLARITIN) 10 MG tablet Take 10 mg by mouth daily. In the morning    . QUEtiapine (SEROQUEL) 50 MG tablet Take 1 tablet by mouth at bedtime.    Marland Kitchen VYVANSE 40 MG capsule Take 40 mg by mouth every morning.  0  . ondansetron (ZOFRAN) 4 MG tablet Take 1 tablet by mouth as needed.    . pantoprazole (PROTONIX) 40 MG tablet Take 1 tablet (40 mg total) by mouth daily.    Marland Kitchen spironolactone (ALDACTONE) 50 MG tablet Take 50 mg by mouth 2 (two) times daily.  0  . azithromycin (ZITHROMAX) 250 MG tablet Take two tablets on day one followed by one tablet on days 2-5 6 each 0   No facility-administered medications prior to visit.      Per HPI unless specifically indicated in ROS section below Review of Systems  Constitutional: Negative for activity change, appetite change, chills, fatigue,  fever and unexpected weight change.  HENT: Negative for hearing loss.   Eyes: Negative for visual disturbance.  Respiratory: Negative for cough, chest tightness, shortness of breath and wheezing.   Cardiovascular: Negative for chest pain, palpitations and leg swelling.  Gastrointestinal: Negative for abdominal distention, abdominal pain, blood in stool, constipation, diarrhea, nausea and vomiting.  Genitourinary: Negative for difficulty urinating and hematuria.  Musculoskeletal: Negative for arthralgias, myalgias and neck pain.  Skin: Negative for rash.  Neurological: Negative for dizziness, seizures, syncope and headaches.  Hematological: Negative for adenopathy. Does not bruise/bleed easily.   Psychiatric/Behavioral: Negative for dysphoric mood. The patient is not nervous/anxious.    Objective:    BP 118/64 (BP Location: Left Arm, Patient Position: Sitting, Cuff Size: Normal)   Pulse 91   Temp 98.6 F (37 C) (Oral)   Ht '5\' 9"'$  (1.753 m)   Wt 221 lb (100.2 kg)   LMP 09/26/2018   SpO2 99%   BMI 32.64 kg/m   Wt Readings from Last 3 Encounters:  11/01/18 221 lb (100.2 kg)  10/02/18 225 lb (102.1 kg)  09/25/17 227 lb (103 kg)    Physical Exam Vitals signs and nursing note reviewed.  Constitutional:      General: She is not in acute distress.    Appearance: Normal appearance. She is well-developed.  HENT:     Head: Normocephalic and atraumatic.     Right Ear: Hearing, tympanic membrane, ear canal and external ear normal.     Left Ear: Hearing, tympanic membrane, ear canal and external ear normal.     Nose: Nose normal.     Mouth/Throat:     Mouth: Mucous membranes are moist.     Pharynx: Uvula midline. No oropharyngeal exudate or posterior oropharyngeal erythema.  Eyes:     General: No scleral icterus.    Extraocular Movements: Extraocular movements intact.     Conjunctiva/sclera: Conjunctivae normal.     Pupils: Pupils are equal, round, and reactive to light.  Neck:     Musculoskeletal: Normal range of motion and neck supple.     Vascular: No carotid bruit.  Cardiovascular:     Rate and Rhythm: Normal rate and regular rhythm.     Pulses: Normal pulses.          Radial pulses are 2+ on the right side and 2+ on the left side.     Heart sounds: Normal heart sounds. No murmur.  Pulmonary:     Effort: Pulmonary effort is normal. No respiratory distress.     Breath sounds: Normal breath sounds. No wheezing, rhonchi or rales.  Abdominal:     General: Bowel sounds are normal. There is no distension.     Palpations: Abdomen is soft. There is no mass.     Tenderness: There is no abdominal tenderness. There is no guarding or rebound.     Hernia: No hernia is present.   Musculoskeletal: Normal range of motion.  Lymphadenopathy:     Cervical: No cervical adenopathy.  Skin:    General: Skin is warm and dry.     Findings: No rash.  Neurological:     General: No focal deficit present.     Mental Status: She is alert and oriented to person, place, and time.     Comments: CN grossly intact, station and gait intact  Psychiatric:        Mood and Affect: Mood normal.        Behavior: Behavior normal.  Thought Content: Thought content normal.        Judgment: Judgment normal.       Results for orders placed or performed in visit on 10/31/18  SAR CoV2 Serology (COVID 19)AB(IGG)IA  Result Value Ref Range   SARS CoV2 AB IGG NEGATIVE   TSH  Result Value Ref Range   TSH 3.16 0.35 - 4.50 uIU/mL  Lipid panel  Result Value Ref Range   Cholesterol 196 0 - 200 mg/dL   Triglycerides 278.0 (H) 0.0 - 149.0 mg/dL   HDL 34.70 (L) >39.00 mg/dL   VLDL 55.6 (H) 0.0 - 40.0 mg/dL   Total CHOL/HDL Ratio 6    NonHDL 161.08   Comprehensive metabolic panel  Result Value Ref Range   Sodium 134 (L) 135 - 145 mEq/L   Potassium 4.0 3.5 - 5.1 mEq/L   Chloride 100 96 - 112 mEq/L   CO2 27 19 - 32 mEq/L   Glucose, Bld 91 70 - 99 mg/dL   BUN 14 6 - 23 mg/dL   Creatinine, Ser 0.82 0.40 - 1.20 mg/dL   Total Bilirubin 0.7 0.2 - 1.2 mg/dL   Alkaline Phosphatase 59 39 - 117 U/L   AST 12 0 - 37 U/L   ALT 11 0 - 35 U/L   Total Protein 7.0 6.0 - 8.3 g/dL   Albumin 3.9 3.5 - 5.2 g/dL   Calcium 9.3 8.4 - 10.5 mg/dL   GFR 80.68 >60.00 mL/min  CBC with Differential/Platelet  Result Value Ref Range   WBC 7.2 4.0 - 10.5 K/uL   RBC 4.62 3.87 - 5.11 Mil/uL   Hemoglobin 14.1 12.0 - 15.0 g/dL   HCT 40.9 36.0 - 46.0 %   MCV 88.4 78.0 - 100.0 fl   MCHC 34.6 30.0 - 36.0 g/dL   RDW 13.4 11.5 - 15.5 %   Platelets 262.0 150.0 - 400.0 K/uL   Neutrophils Relative % 68.7 43.0 - 77.0 %   Lymphocytes Relative 24.0 12.0 - 46.0 %   Monocytes Relative 5.3 3.0 - 12.0 %   Eosinophils  Relative 1.2 0.0 - 5.0 %   Basophils Relative 0.8 0.0 - 3.0 %   Neutro Abs 5.0 1.4 - 7.7 K/uL   Lymphs Abs 1.7 0.7 - 4.0 K/uL   Monocytes Absolute 0.4 0.1 - 1.0 K/uL   Eosinophils Absolute 0.1 0.0 - 0.7 K/uL   Basophils Absolute 0.1 0.0 - 0.1 K/uL  LDL cholesterol, direct  Result Value Ref Range   Direct LDL 129.0 mg/dL   Depression screen [facility] 2/9 11/01/2018  Decreased Interest 1  Down, Depressed, Hopeless 1  PHQ - 2 Score 2  Altered sleeping 2  Tired, decreased energy 2  Change in appetite 0  Feeling bad or failure about yourself  0  Trouble concentrating 0  Moving slowly or fidgety/restless 0  Suicidal thoughts 0  PHQ-9 Score 6     Assessment & Plan:   Problem List Items Addressed This Visit    MDD (major depressive disorder), recurrent episode, moderate ([place])    Appreciate psych care.       Hypertriglyceridemia    Elevated readings. Reviewed diet choices to improve triglyceride levels. Handout provided today. She does not eat fish.      Relevant Medications   spironolactone (ALDACTONE) 50 MG tablet   Healthcare maintenance - Primary    Preventative protocols reviewed and updated unless pt declined. Discussed healthy diet and lifestyle.       GERD    Continue  daily PPI. H/o gastritis 07/2018 by EGD per pt.      Relevant Medications   ondansetron (ZOFRAN) 4 MG tablet   pantoprazole (PROTONIX) 40 MG tablet   Connective tissue disorder ([facility])    Stable period.       Allergic rhinitis    On both zyrtec and claritin daily      Adult acne    On spironolactone for this.           Meds ordered this encounter  Medications  . ondansetron (ZOFRAN) 4 MG tablet    Sig: Take 1 tablet (4 mg total) by mouth as needed.    Dispense:  20 tablet    Refill:  0  . pantoprazole (PROTONIX) 40 MG tablet    Sig: Take 1 tablet (40 mg total) by mouth daily.    Dispense:  90 tablet    Refill:  3  . spironolactone (ALDACTONE) 50 MG tablet    Sig: Take 1 tablet (50 mg  total) by mouth 2 (two) times daily.    Dispense:  90 tablet    Refill:  3   No orders of the defined types were placed in this encounter.   Follow up plan: Return in about 1 year (around 11/01/2019) for annual exam, prior fasting for blood work.  Ria Bush, MD

## 2018-11-01 NOTE — Assessment & Plan Note (Addendum)
Continue daily PPI. H/o gastritis 07/2018 by EGD per pt.

## 2018-11-01 NOTE — Assessment & Plan Note (Signed)
Preventative protocols reviewed and updated unless pt declined. Discussed healthy diet and lifestyle.  

## 2018-11-01 NOTE — Patient Instructions (Addendum)
Triglycerides were too high - work on healthy diet to improve this. Return as needed or in 1 year for next physical. We will request records from [place].   High Triglycerides Eating Plan Triglycerides are a type of fat in the blood. High levels of triglycerides can increase your risk of heart disease and stroke. If your triglyceride levels are high, choosing the right foods can help lower your triglycerides and keep your heart healthy. Work with your health care provider or a diet and nutrition specialist (dietitian) to develop an eating plan that is right for you. What are tips for following this plan? General guidelines   Lose weight, if you are overweight. For most people, losing 5-10 lbs (2-5 kg) helps lower triglyceride levels. A weight-loss plan may include. ? 30 minutes of exercise at least 5 days a week. ? Reducing the amount of calories, sugar, and fat you eat.  Eat a wide variety of fresh fruits, vegetables, and whole grains. These foods are high in fiber.  Eat foods that contain healthy fats, such as fatty fish, nuts, seeds, and olive oil.  Avoid foods that are high in added sugar, added salt (sodium), saturated fat, and trans fat.  Avoid low-fiber, refined carbohydrates such as white bread, crackers, noodles, and white rice.  Avoid foods with partially hydrogenated oils (trans fats), such as fried foods or stick margarine.  Limit alcohol intake to no more than 1 drink a day for nonpregnant women and 2 drinks a day for men. One drink equals 12 oz of beer, 5 oz of wine, or 1 oz of hard liquor. Your health care provider may recommend that you drink less depending on your overall health. Reading food labels  Check food labels for the amount of saturated fat. Choose foods with no or very little saturated fat.  Check food labels for the amount of trans fat. Choose foods with no trans fat.  Check food labels for the amount of cholesterol. Choose foods low in cholesterol. Ask  your dietitian how much cholesterol you should have each day.  Check food labels for the amount of sodium. Choose foods with less than 140 milligrams (mg) per serving. Shopping  Buy dairy products labeled as nonfat (skim) or low-fat (1%).  Avoid buying processed or prepackaged foods. These are often high in added sugar, sodium, and fat. Cooking  Choose healthy fats when cooking, such as olive oil or canola oil.  Cook foods using lower fat methods, such as baking, broiling, boiling, or grilling.  Make your own sauces, dressings, and marinades when possible, instead of buying them. Store-bought sauces, dressings, and marinades are often high in sodium and sugar. Meal planning  Eat more home-cooked food and less restaurant, buffet, and fast food.  Eat fatty fish at least 2 times each week. Examples of fatty fish include salmon, trout, mackerel, tuna, and herring.  If you eat whole eggs, do not eat more than 3 egg yolks per week. What foods are recommended? The items listed may not be a complete list. Talk with your dietitian about what dietary choices are best for you. Grains Whole wheat or whole grain breads, crackers, cereals, and pasta. Unsweetened oatmeal. Bulgur. Barley. Quinoa. Brown rice. Whole wheat flour tortillas. Vegetables Fresh or frozen vegetables. Low-sodium canned vegetables. Fruits All fresh, canned (in natural juice), or frozen fruits. Meats and other protein foods Skinless chicken or [place]. Ground chicken or [place]. Lean cuts of pork, trimmed of fat. Fish and seafood, especially salmon, trout, and herring.  Egg whites. Dried beans, peas, or lentils. Unsalted nuts or seeds. Unsalted canned beans. Natural peanut or almond butter. Dairy Low-fat dairy products. Skim or low-fat (1%) milk. Reduced fat (2%) and low-sodium cheese. Low-fat ricotta cheese. Low-fat cottage cheese. Plain, low-fat yogurt. Fats and oils Tub margarine without trans fats. Light or reduced-fat  mayonnaise. Light or reduced-fat salad dressings. Avocado. Safflower, olive, sunflower, soybean, and canola oils. What foods are not recommended? The items listed may not be a complete list. Talk with your dietitian about what dietary choices are best for you. Grains White bread. White (regular) pasta. White rice. Cornbread. Bagels. Pastries. Crackers that contain trans fat. Vegetables Creamed or fried vegetables. Vegetables in a cheese sauce. Fruits Sweetened dried fruit. Canned fruit in syrup. Fruit juice. Meats and other protein foods Fatty cuts of meat. Ribs. Chicken wings. Berniece Salines. Sausage. Bologna. Salami. Chitterlings. Fatback. Hot dogs. Bratwurst. Packaged lunch meats. Dairy Whole or reduced-fat (2%) milk. Half-and-half. Cream cheese. Full-fat or sweetened yogurt. Full-fat cheese. Nondairy creamers. Whipped toppings. Processed cheese or cheese spreads. Cheese curds. Beverages Alcohol. Sweetened drinks, such as soda, lemonade, fruit drinks, or punches. Fats and oils Butter. Stick margarine. Lard. Shortening. Ghee. Bacon fat. Tropical oils, such as coconut, palm kernel, or palm oils. Sweets and desserts Corn syrup. Sugars. Honey. Molasses. Candy. Jam and jelly. Syrup. Sweetened cereals. Cookies. Pies. Cakes. Donuts. Muffins. Ice cream. Condiments Store-bought sauces, dressings, and marinades that are high in sugar, such as ketchup and barbecue sauce. Summary  High levels of triglycerides can increase the risk of heart disease and stroke. Choosing the right foods can help lower your triglycerides.  Eat plenty of fresh fruits, vegetables, and whole grains. Choose low-fat dairy and lean meats. Eat fatty fish at least twice a week.  Avoid processed and prepackaged foods with added sugar, sodium, saturated fat, and trans fat.  If you need suggestions or have questions about what types of food are good for you, talk with your health care provider or a dietitian. This information is not  intended to replace advice given to you by your health care provider. Make sure you discuss any questions you have with your health care provider. Document Released: 03/03/2004 Document Revised: 07/19/2016 Document Reviewed: 07/19/2016 Elsevier Interactive Patient Education  2019 [employer].

## 2018-11-01 NOTE — Assessment & Plan Note (Signed)
On both zyrtec and claritin daily

## 2018-11-01 NOTE — Assessment & Plan Note (Signed)
Stable period.  

## 2018-11-01 NOTE — Assessment & Plan Note (Addendum)
Elevated readings. Reviewed diet choices to improve triglyceride levels. Handout provided today. She does not eat fish.

## 2018-11-01 NOTE — Assessment & Plan Note (Signed)
Appreciate psych care.  ?

## 2018-11-02 LAB — QUANTIFERON-TB GOLD PLUS
Mitogen-NIL: 8.47 IU/mL
NIL: 0.07 IU/mL
QuantiFERON-TB Gold Plus: NEGATIVE
TB1-NIL: <0 IU/mL
TB2-NIL: 0 IU/mL

## 2018-11-10 ENCOUNTER — Encounter: Payer: Self-pay | Admitting: Family Medicine

## 2018-11-10 DIAGNOSIS — K7689 Other specified diseases of liver: Secondary | ICD-10-CM

## 2018-11-10 HISTORY — DX: Other specified diseases of liver: K76.89

## 2019-01-03 ENCOUNTER — Telehealth: Payer: Self-pay | Admitting: Internal Medicine

## 2019-01-03 DIAGNOSIS — G4733 Obstructive sleep apnea (adult) (pediatric): Secondary | ICD-10-CM

## 2019-01-03 NOTE — Telephone Encounter (Signed)
Spoke with the pt  She was seen here 09/25/17 and had HST done 01/18/18  We ordered CPAP for her om 01/22/18 through [facility]  She states that she could not afford this and so she never started on it  She now can afford it so wants new order sent  She wants [place] to order a small, portable CPAP since she travels often  Please advise thanks

## 2019-01-07 NOTE — Telephone Encounter (Signed)
Margie please send a new order for cpap and supplies to DME. Advise pt that DME typically do not supply travel CPAPs.

## 2019-01-07 NOTE — Telephone Encounter (Signed)
Called and spoke to pt and relayed below message.  Order has been placed for cpap.  Pt stated that she would call back once setup on cpap to arrange f/u.  Nothing further is needed at this time.

## 2019-01-08 ENCOUNTER — Telehealth: Payer: Self-pay | Admitting: Oncology

## 2019-01-08 NOTE — Telephone Encounter (Signed)
Received a referral from Dr. Karin Lieu for high risk breast clinic. I cld and spoke to Ms. Eilts, who is a travel nurse, and scheduled her to see Dr. Jana Hakim on 9/1 at 4pm. She preferred a webex visit since she is not in [place] at the moment.

## 2019-01-09 ENCOUNTER — Encounter: Payer: Self-pay | Admitting: Family Medicine

## 2019-01-09 ENCOUNTER — Telehealth: Payer: Self-pay | Admitting: Internal Medicine

## 2019-01-09 NOTE — Telephone Encounter (Signed)
[place]  [place], Margie A, CMA; [place], [place] K, CMA        Can someone help make this happen?  See below   Previous Messages  ----- Message -----  From: Cletis Athens  Sent: 01/09/2019  9:47 AM EDT  To: Earley Favor  Subject: RE: new CPap Setup                Patient needs a visit stating why she didn't get cpap so we can proceed and we will also need a new  order   Estill Bamberg      Received above message via community message.  Left message for pt.

## 2019-01-09 NOTE — Telephone Encounter (Signed)
Phone visit scheduled. Pt aware of date and time. No further actions necessary at this time.

## 2019-01-13 NOTE — Progress Notes (Signed)
[facility] Consultation      Assessment and Plan:  Obstructive sleep apnea. -Mild sleep apnea with AHI of 7. - Patient is a travel nurse, we will send a prescription for CPAP to of the closest [facility] office, she is currently in [place] state and will be there for approximately next 3 months.  [facility] offices in [place] which he says is not too far from her.  GERD. - Can be contributed to by sleep apnea. -Continue PPI.  Dyspnea. - Reviewed previous cardiopulmonary exercise testing, showed normal pulmonary functions. - Continue activity as tolerated.   Chronic rhinitis. - Likely allergy related. - Continue antihistamine, can consider nasal steroid as indicated.  No orders of the defined types were placed in this encounter.  Return in about 3 months (around 04/16/2019).    Date: 01/13/2019  MRN# [id number] Heather Stewart 16-Aug-1985   Ron Parker is a 33 y.o. old female seen in consultation for chief complaint of:      HPI:  Since her last visit she was not able to start CPAP due to cost, she was not able to afford it despite insurance as they were monthly payments that were required.  Since that time she has sold her house and she no longer has a mortgage, she notes that her symptoms have continued to progress, her family and friends of told her that she snores very loudly and her daytime sleepiness remains.  She therefore would like to start CPAP at this time.   She has gained about 30 lbs over the past 2 years. She works as a travel Marine scientist and a friend told her that she snores loudly in her sleep, and that she stops breathing in her sleep.  She has occasional dyspena with exercise such as walking at fast pace or climbing steps, this has gotten worse since she gained weight. She has had a PFT and stress test a few years ago because of dyspnea and chest pain.  She is sleepy all the time. 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  Her father has OSA and is on CPAP.  She has jaw popping with opening mouth fully and has gotten locked in the past. She no longer chews gum because of locking and clicking.   Has reflux and takes protonix, also takes zyrtec for nasal allergies.   **HST 01/18/18>> Mild OSA with AHI of 7.  **CPET; FVC 112%; FEV1 98%, ratio was 88%; Vo2 max of 96%; o2 pulse 114%  Medication:    Current Outpatient Medications:  .  ALPRAZolam (XANAX) 1 MG tablet, Take 1 mg by mouth 3 (three) times daily as needed. , Disp: , Rfl: 5 .  cetirizine (ZYRTEC) 10 MG tablet, Take 10 mg by mouth at bedtime. , Disp: , Rfl:  .  DULoxetine (CYMBALTA) 60 MG capsule, Take 60 mg by mouth 2 (two) times daily., Disp: , Rfl:  .  loratadine (CLARITIN) 10 MG tablet, Take 10 mg by mouth daily. In the morning, Disp: , Rfl:  .  ondansetron (ZOFRAN) 4 MG tablet, Take 1 tablet (4 mg total) by mouth as needed., Disp: 20 tablet, Rfl: 0 .  pantoprazole (PROTONIX) 40 MG tablet, Take 1 tablet (40 mg total) by mouth daily., Disp: 90 tablet, Rfl: 3 .  QUEtiapine (SEROQUEL) 50 MG tablet, Take 1 tablet by mouth at bedtime., Disp: , Rfl:  .  spironolactone (ALDACTONE) 50 MG tablet, Take 1 tablet (50  mg total) by mouth 2 (two) times daily., Disp: 90 tablet, Rfl: 3 .  VYVANSE 40 MG capsule, Take 40 mg by mouth every morning., Disp: , Rfl: 0   Allergies:  Amoxicillin-pot clavulanate, Neosporin [neomycin-bacitracin zn-polymyx], Doxycycline, and Vyvanse [lisdexamfetamine dimesylate]  Review of Systems:  Constitutional: Feels well. Cardiovascular: Denies chest pain, exertional chest pain.  Pulmonary: Denies hemoptysis, pleuritic chest pain.   The remainder of systems were reviewed and were found to be negative other than what is documented in the HPI.        LABORATORY PANEL:   CBC No results for input(s): WBC, HGB, HCT, PLT in the last 168 hours.  ------------------------------------------------------------------------------------------------------------------  Chemistries  No results for input(s): NA, K, CL, CO2, GLUCOSE, BUN, CREATININE, CALCIUM, MG, AST, ALT, ALKPHOS, BILITOT in the last 168 hours.  Invalid input(s): GFRCGP ------------------------------------------------------------------------------------------------------------------  Cardiac Enzymes No results for input(s): TROPONINI in the last 168 hours. ------------------------------------------------------------  RADIOLOGY:  No results found.     Thank  you for the consultation and for allowing [facility] to assist in the care of your patient. Our recommendations are noted above.  Please contact us if we can be of further service.  Marda Stalker, M.D., F.C.C.P.  Board Certified in Internal Medicine, Pulmonary Medicine, [place], and Sleep Medicine.   Pulmonary and Critical Care Office Number: [phone]. 01/13/2019

## 2019-01-14 ENCOUNTER — Other Ambulatory Visit: Payer: Self-pay

## 2019-01-14 ENCOUNTER — Ambulatory Visit (INDEPENDENT_AMBULATORY_CARE_PROVIDER_SITE_OTHER): Payer: 59 | Admitting: Internal Medicine

## 2019-01-14 DIAGNOSIS — G4733 Obstructive sleep apnea (adult) (pediatric): Secondary | ICD-10-CM | POA: Diagnosis not present

## 2019-01-14 NOTE — Addendum Note (Signed)
Addended by: Maryanna Shape A on: 01/14/2019 03:42 PM   Modules accepted: Orders

## 2019-01-14 NOTE — Patient Instructions (Signed)
Will order auto-CPAP with pressure range 5-20 cm H20.  Will submit referral to [facility], order can be sent to [facility];  [place] [address] Phone: [place]: [phone]

## 2019-01-28 ENCOUNTER — Telehealth: Payer: Self-pay | Admitting: Oncology

## 2019-01-28 NOTE — Telephone Encounter (Signed)
Called patient regarding upcoming Webex appointment, patient is notified and e-mail has been sent. If Webex doesn't work, please contact patient by a phone call instead.

## 2019-01-28 NOTE — Progress Notes (Addendum)
[facility]  [phone] [phone]    ID: Heather Stewart DOB: 10/30/1985  MR#: [id number]  JSH#:[id number]  Patient Care Team: Ria Bush, MD as PCP - General Shalicia Craghead, Virgie Dad, MD as Consulting Physician (Oncology) Kerry Dory, NP as Nurse Practitioner (Nurse Practitioner) Linda Hedges, DO as Consulting Physician (Obstetrics and Gynecology) OTHER MD:   I connected with Ron Parker on 01/29/19 at  4:00 PM EDT by video enabled telemedicine visit and verified that I am speaking with the correct person using two identifiers.   Her current address is [address], [place], [place], [address]  I discussed the limitations, risks, security and privacy concerns of performing an evaluation and management service by telemedicine and the availability of in-person appointments. I also discussed with the patient that there may be a patient responsible charge related to this service. The patient expressed understanding and agreed to proceed.   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   She underwent bilateral diagnostic mammography with tomography and bilateral breast ultrasonography at [facility] on 04/16/2018 showing: Breast Density Category C. Questioned asymmetries within the upper-outer right and upper outer left breast suggestive of dense fibroglandular tissue. On physical exam, I palpate no discrete mass within the upper-outer right or upper-outer left breast. Targeted ultrasound  is performed, showing a 6 x 4 x 6 mm oval circumscribed hypoechoic mass right breast 10 o'clock position 5 cm from nipple. There is a 4 x 3 x 5 mm oval circumscribed hypoechoic mass right breast 10 o'clock position 1 cm from the nipple. Both masses are favored to represent complicated cysts. There is surrounding dense tissue within the upper-outer right breast. No right axillary adenopathy. Within the left breast upper outer quadrant there is no discrete mass identified. Dense tissue is visualized.  Accordingly on 04/18/2018 she proceeded to biopsy of the right breast area in question. The pathology from this procedure showed [id number]): benign breast tissue with fibrocystic change, fibro adenomatoid change. Negative for carcinoma.  The other lesion was aspirated and completely collapsed on aspiration.  Note also that on 303 2020 she underwent CT scan of the chest abdomen and pelvis for evaluation of abdominal pain and possible Marfan's.  The aortic root was normal with no evidence of dissection and the ascending aorta was normal in caliber.  The descending aorta was also normal in course and caliber.  There were no aneurysms.  The rest of the exam was unremarkable except for a mass in the left hepatic lobe.  Accordingly on 08/09/2018 she underwent MRI of the abdomen with and without contrast in [place] showing a 3.5 cm mass in the left hepatic lobe most compatible with focal nodular hyperplasia.  The rest of the exam was unremarkable.  The patient's subsequent history is as detailed below.   INTERVAL HISTORY: Heather Stewart was evaluated in the high risk cancer clinic on 01/29/2019.  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  She pretty much works all the time.  At home it is just herself  and she is taking appropriate pandemic precautions.  She tells me she was having very irregular and somewhat spotty periods until she went a  lower strength and that regularize things but then she ran out of the prescription and has not been able to get it refilled.  A detailed review of systems was otherwise stable.   PAST MEDICAL HISTORY: Past Medical History:  Diagnosis Date  . ADHD (attention deficit hyperactivity disorder), combined type    dx as child and then received through psychiatry - reviewed records from psych: no recent w/u.  was on vyvanse during nursing school  . Adjustment disorder   . Chronic back pain    thoracic and lumbar - s/p unrevealing NSG eval   . Connective tissue disease ([place])    ocular lens dislocation, flat feet, high palate, no cardiac involvement  . Esophageal reflux   . Eye problems 08/2011   subluxated lenses OU, rec test for homocyetinuria to r/o as cause (see scanned form)  . Focal nodular hyperplasia of liver 11/10/2018   By [facility] at [facility] 07/2018 - see scanned report  . Hx of migraines   . Irritable bowel syndrome   . MDD (major depressive disorder), recurrent episode ([place])   . Screening examination for pulmonary tuberculosis   . Swelling, mass, or lump in head and neck   . Uvulitis 10/02/2018     PAST SURGICAL HISTORY: Past Surgical History:  Procedure Laterality Date  . ESOPHAGOGASTRODUODENOSCOPY  07/2018   gastric polyps biopsied, mild acute ?alcoholic gastritis (Dr Jabier Mutton in [place])  . FOOT SURGERY     Right-glass removed in OR  . [place]  . GANGLION CYST EXCISION     Left wrist  . MYRINGOTOMY  multiple   had tissue from behind ears implanted into bilateral TMs to close (1990s)     FAMILY HISTORY: Family History  Problem Relation Age of Onset  . Alcohol abuse Father   . Other Father        Spinal problem  . Migraines Mother   . Coronary artery disease Other        GF 5v bypass  . COPD Paternal Grandfather   . Breast cancer Maternal Aunt        x2  . Cancer Brother        HALF brother (had kidney removed at 85  months-then died of cancer)  . Breast cancer Other        Aunts x3  . Heart disease Other        MGF, PGF, GGM  . Kidney disease Other        GGM  . Liver cancer Brother    Maternal grandfather died from lung cancer at age 62; he was a smoker.  Maternal grandmother is 84 as of 07/31/2018; she was diagnosed with breast cancer at 39. The patient has  1 maternal aunt diagnosed with breast cancer at 107, BRCA negative; she was also diagnosed with bladder cancer.  Also a maternal great aunt was diagnosed with breast cancer at 58, and was later diagnosed with non-Hodgkin's lymphoma. A maternal great great aunt was diagnosed with ovarian cancer.  The patient also has some more remote cousins with breast cancer.   The patient's brother was diagnosed with DSRCT(sarcoma) at 55, and died at the age of 20.   She also has a maternal half brother who is 41 years old  GYNECOLOGIC HISTORY:  No LMP recorded. 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  She works chiefly in emergency rooms.  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  She usually stays at a job 3 to 12 months.  She is single and lives by herself.   ADVANCED DIRECTIVES:    HEALTH MAINTENANCE: Social History   Tobacco Use  . Smoking status: Never Smoker  . Smokeless tobacco: Never Used  Substance Use Topics  . Alcohol use: Yes    Comment: occ wine  . Drug use: No    Colonoscopy: n/a  PAP: UTD  Bone density: n/a Mammography: 04/16/2018  Allergies  Allergen Reactions  . Amoxicillin-Pot Clavulanate Other (See Comments) and Rash    Severe GI upset Diarrhea  . Neosporin [Neomycin-Bacitracin Zn-Polymyx] Rash  . Doxycycline Other (See Comments)    abd pain  . Vyvanse [Lisdexamfetamine Dimesylate] Other (See Comments)    headache    Current Outpatient Medications  Medication Sig Dispense Refill  . ALPRAZolam (XANAX) 1 MG  tablet Take 1 mg by mouth 3 (three) times daily as needed.   5  . cetirizine (ZYRTEC) 10 MG tablet Take 10 mg by mouth at bedtime.     . DULoxetine (CYMBALTA) 60 MG capsule Take 60 mg by mouth 2 (two) times daily.    Marland Kitchen loratadine (CLARITIN) 10 MG tablet Take 10 mg by mouth daily. In the morning    . norethindrone-ethinyl estradiol (LOESTRIN 1/20, 21,) 1-20 MG-MCG tablet Take 1 tablet by mouth daily. 1 Package 11  . ondansetron (ZOFRAN) 4 MG tablet Take 1 tablet (4 mg total) by mouth as needed. 20 tablet 0  . pantoprazole (PROTONIX) 40 MG tablet Take 1 tablet (40 mg total) by mouth daily. 90 tablet 3  . QUEtiapine (SEROQUEL) 50 MG tablet Take 1 tablet by mouth at bedtime.    Marland Kitchen spironolactone (ALDACTONE) 50 MG tablet Take 1 tablet (50 mg total) by mouth 2 (two) times daily. 90 tablet 3  . VYVANSE 40 MG capsule Take 40 mg by mouth every morning.  0   No current facility-administered medications for this visit.      OBJECTIVE: Young white woman in no acute distress  There were no vitals filed for this visit. Wt Readings from Last 3 Encounters:  11/01/18 221 lb (100.2 kg)  10/02/18 225 lb (102.1 kg)  09/25/17 227 lb (103 kg)   There is no height or weight on file to calculate BMI.    ECOG FS:1 - Symptomatic but completely ambulatory  Tele-visit  LAB RESULTS:  CMP     Component Value Date/Time   NA 134 (L) 10/31/2018 1019   K 4.0 10/31/2018 1019   CL 100 10/31/2018 1019   CO2 27 10/31/2018 1019   GLUCOSE 91 10/31/2018 1019   BUN 14 10/31/2018 1019   CREATININE 0.82 10/31/2018 1019   CALCIUM 9.3 10/31/2018 1019   PROT 7.0 10/31/2018 1019   ALBUMIN 3.9 10/31/2018 1019   AST 12 10/31/2018 1019   ALT 11 10/31/2018 1019   ALKPHOS 59 10/31/2018 1019   BILITOT 0.7 10/31/2018 1019   GFRNONAA 83.16 09/05/2008 0929    No results found for: TOTALPROTELP, ALBUMINELP, A1GS, A2GS, BETS, BETA2SER, GAMS, MSPIKE, SPEI  No results found for: KPAFRELGTCHN, LAMBDASER, KAPLAMBRATIO  Lab  Results  Component Value Date   WBC 7.2 10/31/2018   NEUTROABS 5.0 10/31/2018   HGB 14.1 10/31/2018   HCT 40.9 10/31/2018  MCV 88.4 10/31/2018   PLT 262.0 10/31/2018    '@LASTCHEMISTRY'$ @  No results found for: LABCA2  No components found for: MVHQIO962  No results for input(s): INR in the last 168 hours.  No results found for: LABCA2  No results found for: XBM841  No results found for: LKG401  No results found for: UUV253  No results found for: CA2729  No components found for: HGQUANT  No results found for: CEA1 / No results found for: CEA1   No results found for: AFPTUMOR  No results found for: CHROMOGRNA  No results found for: PSA1  No visits with results within 3 Day(s) from this visit.  Latest known visit with results is:  Lab on 10/31/2018  Component Date Value Ref Range Status  . TSH 10/31/2018 3.16  0.35 - 4.50 uIU/mL Final  . Cholesterol 10/31/2018 196  0 - 200 mg/dL Final   ATP III Classification       Desirable:  < 200 mg/dL               Borderline High:  200 - 239 mg/dL          High:  > = 240 mg/dL  . Triglycerides 10/31/2018 278.0* 0.0 - 149.0 mg/dL Final   Normal:  <150 mg/dLBorderline High:  150 - 199 mg/dL  . HDL 10/31/2018 34.70* >39.00 mg/dL Final  . VLDL 10/31/2018 55.6* 0.0 - 40.0 mg/dL Final  . Total CHOL/HDL Ratio 10/31/2018 6   Final                  Men          Women1/2 Average Risk     3.4          3.3Average Risk          5.0          4.42X Average Risk          9.6          7.13X Average Risk          15.0          11.0                      . NonHDL 10/31/2018 161.08   Final   NOTE:  Non-HDL goal should be 30 mg/dL higher than patient's LDL goal (i.e. LDL goal of < 70 mg/dL, would have non-HDL goal of < 100 mg/dL)  . Sodium 10/31/2018 134* 135 - 145 mEq/L Final  . Potassium 10/31/2018 4.0  3.5 - 5.1 mEq/L Final  . Chloride 10/31/2018 100  96 - 112 mEq/L Final  . CO2 10/31/2018 27  19 - 32 mEq/L Final  . Glucose, Bld 10/31/2018 91   70 - 99 mg/dL Final  . BUN 10/31/2018 14  6 - 23 mg/dL Final  . Creatinine, Ser 10/31/2018 0.82  0.40 - 1.20 mg/dL Final  . Total Bilirubin 10/31/2018 0.7  0.2 - 1.2 mg/dL Final  . Alkaline Phosphatase 10/31/2018 59  39 - 117 U/L Final  . AST 10/31/2018 12  0 - 37 U/L Final  . ALT 10/31/2018 11  0 - 35 U/L Final  . Total Protein 10/31/2018 7.0  6.0 - 8.3 g/dL Final  . Albumin 10/31/2018 3.9  3.5 - 5.2 g/dL Final  . Calcium 10/31/2018 9.3  8.4 - 10.5 mg/dL Final  . GFR 10/31/2018 80.68  >60.00 mL/min Final  . SARS CoV2 AB IGG 10/31/2018 NEGATIVE  Final   Comment: . Reference range: Negative . This test is intended for use as an aid in identifying individuals with an adaptive immune response to  SARS-CoV-2, indicating recent or prior infection.  Results are for the detection of SARS-CoV-2 antibodies. IgG antibodies to SARS-CoV-2 are generally detectable  in blood several days after initial infection, although the duration of time antibodies are present post-infection is not well characterized. At this time, it is unknown  for how long antibodies persist following infection and if the presence of antibodies confers protective immunity. Individuals may have detectable virus by  molecular testing present for several weeks following seroconversion. Negative results do not preclude acute SARS-CoV-2 infection. This test should not be used to  diagnose acute SARS-CoV-2 infection. If acute infection is suspected, direct testing by molecular methods for  SARS-CoV-2 is necessary. False positive results for the test may occur due to cro                          ss-reactivity from pre-existing antibodies or other possible causes.  . Please review the  Fact Sheets  available for health  care providers and patients using the following websites:   [facility]com/home/Covid-19/HCP/antibody/fact-sheet1  [facility]com/home/Covid-19/Patients/antibody/fact-sheet1 . This test has been  authorized by the FDA under an Emergency Use Authorization (EUA) for use by authorized laboratories. The FDA authorized labeling is available  on the [employer] website: www.[facility].com/Covid19.   . For additional information please refer to  http://education.[facility].com/faq/FAQ219 (This link is being provided for informational/educational purposes only.)      . WBC 10/31/2018 7.2  4.0 - 10.5 K/uL Final  . RBC 10/31/2018 4.62  3.87 - 5.11 Mil/uL Final  . Hemoglobin 10/31/2018 14.1  12.0 - 15.0 g/dL Final  . HCT 10/31/2018 40.9  36.0 - 46.0 % Final  . MCV 10/31/2018 88.4  78.0 - 100.0 fl Final  . MCHC 10/31/2018 34.6  30.0 - 36.0 g/dL Final  . RDW 10/31/2018 13.4  11.5 - 15.5 % Final  . Platelets 10/31/2018 262.0  150.0 - 400.0 K/uL Final  . Neutrophils Relative % 10/31/2018 68.7  43.0 - 77.0 % Final  . Lymphocytes Relative 10/31/2018 24.0  12.0 - 46.0 % Final  . Monocytes Relative 10/31/2018 5.3  3.0 - 12.0 % Final  . Eosinophils Relative 10/31/2018 1.2  0.0 - 5.0 % Final  . Basophils Relative 10/31/2018 0.8  0.0 - 3.0 % Final  . Neutro Abs 10/31/2018 5.0  1.4 - 7.7 K/uL Final  . Lymphs Abs 10/31/2018 1.7  0.7 - 4.0 K/uL Final  . Monocytes Absolute 10/31/2018 0.4  0.1 - 1.0 K/uL Final  . Eosinophils Absolute 10/31/2018 0.1  0.0 - 0.7 K/uL Final  . Basophils Absolute 10/31/2018 0.1  0.0 - 0.1 K/uL Final  . QuantiFERON-TB Gold Plus 10/31/2018 NEGATIVE  NEGATIVE Final   Comment: Negative test result. M. tuberculosis complex  infection unlikely.   Marland Kitchen NIL 10/31/2018 0.07  IU/mL Final  . Mitogen-NIL 10/31/2018 8.47  IU/mL Final  . TB1-NIL 10/31/2018 <0.00  IU/mL Final  . TB2-NIL 10/31/2018 0.00  IU/mL Final   Comment: . The Nil tube value reflects the background interferon gamma immune response of the patient's blood sample. This value has been subtracted from the patient's displayed TB and Mitogen results. . Lower than expected results with the Mitogen  tube prevent false-negative Quantiferon readings by detecting a patient with a potential immune suppressive condition and/or suboptimal pre-analytical specimen handling. . The  TB1 Antigen tube is coated with the M. tuberculosis-specific antigens designed to elicit responses from TB antigen primed CD4+ helper T-lymphocytes. . The TB2 Antigen tube is coated with the M. tuberculosis-specific antigens designed to elicit responses from TB antigen primed CD4+ helper and CD8+ cytotoxic T-lymphocytes. . For additional information, please refer to https://education.[facility].com/faq/FAQ204 (This link is being provided for informational/ educational purposes only.) .   Marland Kitchen Direct LDL 10/31/2018 129.0  mg/dL Final   Optimal:  <100 mg/dLNear or Above Optimal:  100-129 mg/dLBorderline High:  130-159 mg/dLHigh:  160-189 mg/dLVery High:  >190 mg/dL    (this displays the last labs from the last 3 days)  No results found for: TOTALPROTELP, ALBUMINELP, A1GS, A2GS, BETS, BETA2SER, GAMS, MSPIKE, SPEI (this displays SPEP labs)  No results found for: KPAFRELGTCHN, LAMBDASER, KAPLAMBRATIO (kappa/lambda light chains)  No results found for: HGBA, HGBA2QUANT, HGBFQUANT, HGBSQUAN (Hemoglobinopathy evaluation)   No results found for: LDH  No results found for: IRON, TIBC, IRONPCTSAT (Iron and TIBC)  No results found for: FERRITIN  Urinalysis    Component Value Date/Time   LABSPEC 1.025 02/08/2010 2059   PHURINE 5.5 02/08/2010 2059   GLUCOSEU NEGATIVE 02/08/2010 2059   HGBUR SMALL (A) 02/08/2010 2059   BILIRUBINUR neg 04/15/2016 [place] 02/08/2010 2059   PROTEINUR neg 04/15/2016 [place] 02/08/2010 2059   UROBILINOGEN 0.2 04/15/2016 1020   UROBILINOGEN 0.2 02/08/2010 2059   NITRITE neg 04/15/2016 1020   NITRITE NEGATIVE 02/08/2010 2059   LEUKOCYTESUR Negative 04/15/2016 1020     STUDIES:  No results found.   ELIGIBLE FOR AVAILABLE RESEARCH  PROTOCOL: no   ASSESSMENT: 33 y.o. 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  (2) ovarian cancer risk:  (a) continue LoEstrin  (b) consider bilateral salpingo-oophorectomy after her family planning complete  (3) breast cancer risk:  (a) not a good candidate for bilateral mastectomies because of concern regarding connective tissue disease  (b) intensified screening, with yearly MRI in addition to yearly mammography  (c) to start antiestrogens after family-planning complete   PLAN: We spent more than 50% of today's 50 minute appointment in counseling and coordination of care regarding the biology of the patient's diagnosis and the specifics of her situation. Heather Stewart understands she has been found to be at high risk for developing breast cancer based on her family history and genetic profile.  She is also had a higher than average risk of ovarian cancer.  We discussed the ovarian cancer issue first.  By taking oral contraceptives she is decreasing that risk.  She does want to have children at some point.  She is now 35.  We discussed possible ovarian preservation and she will explore this possibility in [place].  Once that she has had her family, around the age of 76 she should consider bilateral salpingo-oophorectomy.  We then reviewed her options as far as Breast cancer is concerned. There are 2 ways of dealing with this problem.  One is risk reduction.  The other one is intensified screening.  One way to reduce the risk is bilateral mastectomies.  We discussed this in great detail and she understands the different types of mastectomies as well as the option of no reconstruction and the various types of possible reconstructions.  Certainly if she were to go this way, nipple sparing mastectomy with implant  reconstruction might be an optimal choice.  However she is appropriately concerned that she has some type of connective tissue disorder, not yet completely characterized, and this might well affect her ability to heal appropriately from this procedure.  At this point bilateral mastectomy is not in the works.  Another approach to risk reduction is antiestrogens.  This however is not appropriate while she is taking oral contraceptives.  It will become an option once she completes her family-planning.  A third way to reduce her cancer risk--not specific to breast cancer--is to optimize her diet and exercise routine.  Ideally she would be on a mostly vegetable diet with limited carbohydrates. Meats and dairy are allowed. The exercise goal is 45 minutes 5 times a week of activity vigorous enough to induce perspiration. This would reduce her risk of any cancer developing by between 1 and 3%.    After discussing the options for risk reduction, we discussed intensified screening.  This would include yearly mammography with tomography, biannual breast exams by an MD, and a yearly breast MRI.  Heather Stewart understands that adding the MRI may increase cost and also put her at risk for false positives, which may lead to unnecessary procedures.  The point of course would be to find any cancer that may develop at the very earliest stages to increase the chance of cure with minimal interventions (specifically without chemotherapy).  Heather Stewart likes this idea but has not a hard time making it work because she says every time she is in [place] to have an MRI she is not at the right time as far as her period is concerned.  We are going to try to set her up for a breast MRI through the [facility] sound radiology in [facility] (Dr Lennette Bihari [place],[phone]).  As soon as I have Heather Stewart's contact number I will place the order.  I also filled her LoEstrin prescription in [place] since she had run out 2 weeks ago (and still has not  had a. period).  Heather Stewart has a good understanding of the overall plan. She agrees with it. She knows the goal of treatment in her case is prevention. She will call with any problems that may develop before her next visit here.  Mitsuo Budnick, Virgie Dad, MD  01/29/19 4:36 PM Medical Oncology and Hematology [facility] [address] [place], [place] [address] Tel. [phone]    Fax. [phone]   I, Jacqualyn Posey am acting as a Education administrator for Chauncey Cruel, MD.   I, Lurline Del MD, have reviewed the above documentation for accuracy and completeness, and I agree with the above.

## 2019-01-28 NOTE — Telephone Encounter (Signed)
Left VM for pt to confirm appt and verify info

## 2019-01-29 ENCOUNTER — Inpatient Hospital Stay: Payer: 59 | Attending: Oncology | Admitting: Oncology

## 2019-01-29 DIAGNOSIS — Z1501 Genetic susceptibility to malignant neoplasm of breast: Secondary | ICD-10-CM | POA: Diagnosis not present

## 2019-01-29 DIAGNOSIS — M359 Systemic involvement of connective tissue, unspecified: Secondary | ICD-10-CM

## 2019-01-29 DIAGNOSIS — Z1589 Genetic susceptibility to other disease: Secondary | ICD-10-CM

## 2019-01-29 DIAGNOSIS — K589 Irritable bowel syndrome without diarrhea: Secondary | ICD-10-CM

## 2019-01-29 DIAGNOSIS — Z1239 Encounter for other screening for malignant neoplasm of breast: Secondary | ICD-10-CM

## 2019-01-29 DIAGNOSIS — Z1509 Genetic susceptibility to other malignant neoplasm: Secondary | ICD-10-CM

## 2019-01-29 DIAGNOSIS — Z9189 Other specified personal risk factors, not elsewhere classified: Secondary | ICD-10-CM

## 2019-01-29 DIAGNOSIS — F331 Major depressive disorder, recurrent, moderate: Secondary | ICD-10-CM

## 2019-01-29 MED ORDER — NORETHINDRONE ACET-ETHINYL EST 1-20 MG-MCG PO TABS: 1.0000 | ORAL_TABLET | Freq: Every day | ORAL | 11 refills | Status: DC

## 2019-03-25 ENCOUNTER — Ambulatory Visit
Admission: RE | Admit: 2019-03-25 | Discharge: 2019-03-25 | Disposition: A | Discharge status: Home / Self Care | Payer: 59 | Source: Ambulatory Visit | Attending: Oncology | Admitting: Oncology

## 2019-03-25 ENCOUNTER — Other Ambulatory Visit: Payer: Self-pay

## 2019-03-25 ENCOUNTER — Other Ambulatory Visit: Payer: Self-pay | Admitting: Obstetrics & Gynecology

## 2019-03-25 DIAGNOSIS — Z1231 Encounter for screening mammogram for malignant neoplasm of breast: Secondary | ICD-10-CM

## 2019-03-25 DIAGNOSIS — Z9189 Other specified personal risk factors, not elsewhere classified: Secondary | ICD-10-CM

## 2019-03-25 MED ORDER — GADOBUTROL 1 MMOL/ML IV SOLN
10.0000 mL | Freq: Once | INTRAVENOUS | Status: AC | PRN
  Administered 2019-03-25: 10 mL via INTRAVENOUS

## 2019-03-26 ENCOUNTER — Encounter: Payer: Self-pay | Admitting: Oncology

## 2019-05-27 ENCOUNTER — Ambulatory Visit: Payer: 59

## 2019-08-01 ENCOUNTER — Ambulatory Visit: Payer: 59 | Admitting: Family Medicine

## 2019-08-01 ENCOUNTER — Encounter: Payer: Self-pay | Admitting: Family Medicine

## 2019-08-01 ENCOUNTER — Other Ambulatory Visit: Payer: Self-pay

## 2019-08-01 VITALS — BP 122/78 | HR 88 | Temp 97.8°F | Ht 69.0 in | Wt 229.0 lb

## 2019-08-01 DIAGNOSIS — M359 Systemic involvement of connective tissue, unspecified: Secondary | ICD-10-CM

## 2019-08-01 DIAGNOSIS — Z1589 Genetic susceptibility to other disease: Secondary | ICD-10-CM

## 2019-08-01 DIAGNOSIS — Z1509 Genetic susceptibility to other malignant neoplasm: Secondary | ICD-10-CM

## 2019-08-01 DIAGNOSIS — H5789 Other specified disorders of eye and adnexa: Secondary | ICD-10-CM | POA: Insufficient documentation

## 2019-08-01 DIAGNOSIS — Z1501 Genetic susceptibility to malignant neoplasm of breast: Secondary | ICD-10-CM | POA: Diagnosis not present

## 2019-08-01 MED ORDER — OLOPATADINE HCL 0.1 % OP SOLN: 1.0000 [drp] | Freq: Two times a day (BID) | OPHTHALMIC | 1 refills | Status: DC

## 2019-08-01 NOTE — Assessment & Plan Note (Signed)
Significant L eye history s/p lens replacement with tension rings for dislocation (2018), with tear duct plug in place.  Anticipate allergic conjunctivitis - not consistent with bacterial infection at this time.  Treat with patanol eye drop into left eye, regular use of preservative-free lubricating eye drops.  If not better with this, I did recommend eye doctor evaluation. She agrees with plan.

## 2019-08-01 NOTE — Assessment & Plan Note (Signed)
Requests new referral to geneticist - placed.

## 2019-08-01 NOTE — Patient Instructions (Addendum)
We will refer you to genetic clinic. I think this is more allergic conjunctivitis of left eye - treat with lubricating eye drops and antihistamine eye drop patanol sent to pharmacy.  If not improving with this, schedule appointment with the eye doctor for a recheck.

## 2019-08-01 NOTE — Progress Notes (Signed)
This visit was conducted in person.  BP 122/78 (BP Location: Left Arm, Patient Position: Sitting, Cuff Size: Normal)   Pulse 88   Temp 97.8 F (36.6 C) (Temporal)   Ht '5\' 9"'$  (1.753 m)   Wt 229 lb (103.9 kg)   LMP 07/16/2019   SpO2 98%   BMI 33.82 kg/m    CC: conjunctivitis Subjective:    Patient ID: Heather Stewart, female    DOB: 14-Jul-1985, 34 y.o.   MRN: [id number]  HPI: Heather Stewart is a 34 y.o. female presenting on 08/01/2019 for Eye Problem (C/o left eye redness and clear drainage. Also, c/o facial itching, sinus pressure, drainage. Started 0n 07/27/19 after returing from trip.  Tried Zyrted and Claritan, barely helpful. )   <1 wk h/o L red eye, tearing, itching, sinus pressure, PNdrainage. Significant nasal congestion and drainage. Chronic headache.   No fevers, cough, no loss of taste or smell.   She did have L eye surgery (lens replacement for dislocation and tension rings placed.  Last saw eye doctor 10/2018 - L eye tear duct placed   No improvement with claritin and zyrtec.   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   Feels CTD symptoms are worsening - joint pains, poor wound healing, peripheral neuropathy, ongoing weight gain. Saw rheumatologist 2018 (Dr Annalee Genta). Has been referred to geneticist ([facility]). Requests new referral. Per initial rheum consult note 04/2016:  Hypermobility syndrome Beighton score 7/9 Ski hyperextensibility, joint dislocation, easy bruising, stretch marks, stress urinary incontinence High arch palate, micrognathia, span/height  Non documented ectopic lentis  No cardiologic finding (no aorta dilation, no MVP) Negative 1st degree family history  Her finding are concerning for connective tissue: Ehler Danlos>Marfan Will refer patient to [facility] genetics for further assessment   She has seen onc Dr Jana Hakim due to positive test for PALB2 mutation.       Relevant past medical, surgical, family and social history reviewed and updated as indicated. Interim medical history since our last visit reviewed. Allergies and medications reviewed and updated. Outpatient Medications Prior to Visit  Medication Sig Dispense Refill  . ALPRAZolam (XANAX) 1 MG tablet Take 1 mg by mouth 3 (three) times daily as needed.   5  . cetirizine (ZYRTEC) 10 MG tablet Take 10 mg by mouth at bedtime. As needed    . DULoxetine (CYMBALTA) 60 MG capsule Take 60 mg by mouth 2 (two) times daily.    Marland Kitchen loratadine (CLARITIN) 10 MG tablet Take 10 mg by mouth daily. In the morning    . ondansetron (ZOFRAN) 4 MG tablet Take 1 tablet (4 mg total) by mouth as needed. 20 tablet 0  . pantoprazole (PROTONIX) 40 MG tablet Take 1 tablet (40 mg total) by mouth daily. 90 tablet 3  . QUEtiapine (SEROQUEL) 50 MG tablet Take 1 tablet by mouth at bedtime.    Marland Kitchen spironolactone (ALDACTONE) 50 MG tablet Take 1 tablet (50 mg total) by mouth 2 (two) times daily. 90 tablet 3  . VYVANSE 40 MG capsule Take 40 mg by mouth every morning.  0  . norethindrone-ethinyl estradiol (LOESTRIN 1/20, 21,) 1-20 MG-MCG tablet Take 1 tablet by mouth daily. 1 Package 11   No facility-administered medications prior to visit.     Per HPI unless specifically indicated in ROS section below Review of Systems Objective:    BP 122/78 (BP Location: Left Arm, Patient Position:  Sitting, Cuff Size: Normal)   Pulse 88   Temp 97.8 F (36.6 C) (Temporal)   Ht '5\' 9"'$  (1.753 m)   Wt 229 lb (103.9 kg)   LMP 07/16/2019   SpO2 98%   BMI 33.82 kg/m   Wt Readings from Last 3 Encounters:  08/01/19 229 lb (103.9 kg)  11/01/18 221 lb (100.2 kg)  10/02/18 225 lb (102.1 kg)    Physical Exam Vitals and nursing note reviewed.  Constitutional:      General: She is not in acute distress.    Appearance: Normal appearance. She is well-developed. She is obese.  HENT:     Head: Normocephalic and atraumatic.     Right Ear: Hearing,  tympanic membrane, ear canal and external ear normal.     Left Ear: Hearing, tympanic membrane, ear canal and external ear normal.     Nose: Nose normal. No mucosal edema, congestion or rhinorrhea.     Right Sinus: No maxillary sinus tenderness or frontal sinus tenderness.     Left Sinus: No maxillary sinus tenderness or frontal sinus tenderness.     Mouth/Throat:     Mouth: Mucous membranes are moist.     Pharynx: Oropharynx is clear. Uvula midline. No oropharyngeal exudate or posterior oropharyngeal erythema.     Tonsils: No tonsillar abscesses.  Eyes:     General: No scleral icterus.    Extraocular Movements: Extraocular movements intact.     Conjunctiva/sclera:     Right eye: Right conjunctiva is not injected. No exudate.    Left eye: Left conjunctiva is injected. No exudate.    Pupils: Pupils are equal, round, and reactive to light.     Comments: L eye bulbar and palpebral conjunctival injection with limbic sparing. Tear duct plug in place on left.   Cardiovascular:     Rate and Rhythm: Normal rate and regular rhythm.     Pulses: Normal pulses.     Heart sounds: Normal heart sounds. No murmur.  Pulmonary:     Effort: Pulmonary effort is normal. No respiratory distress.     Breath sounds: Normal breath sounds. No wheezing, rhonchi or rales.  Musculoskeletal:     Cervical back: Normal range of motion and neck supple.  Lymphadenopathy:     Cervical: No cervical adenopathy.  Skin:    General: Skin is warm and dry.     Findings: No rash.  Neurological:     Mental Status: She is alert.  Psychiatric:        Mood and Affect: Mood normal.        Behavior: Behavior normal.       Results for orders placed or performed in visit on 10/31/18  SAR CoV2 Serology (COVID 19)AB(IGG)IA  Result Value Ref Range   SARS CoV2 AB IGG NEGATIVE   TSH  Result Value Ref Range   TSH 3.16 0.35 - 4.50 uIU/mL  Lipid panel  Result Value Ref Range   Cholesterol 196 0 - 200 mg/dL   Triglycerides  278.0 (H) 0.0 - 149.0 mg/dL   HDL 34.70 (L) >39.00 mg/dL   VLDL 55.6 (H) 0.0 - 40.0 mg/dL   Total CHOL/HDL Ratio 6    NonHDL 161.08   Comprehensive metabolic panel  Result Value Ref Range   Sodium 134 (L) 135 - 145 mEq/L   Potassium 4.0 3.5 - 5.1 mEq/L   Chloride 100 96 - 112 mEq/L   CO2 27 19 - 32 mEq/L   Glucose, Bld 91 70 - 99  mg/dL   BUN 14 6 - 23 mg/dL   Creatinine, Ser 0.82 0.40 - 1.20 mg/dL   Total Bilirubin 0.7 0.2 - 1.2 mg/dL   Alkaline Phosphatase 59 39 - 117 U/L   AST 12 0 - 37 U/L   ALT 11 0 - 35 U/L   Total Protein 7.0 6.0 - 8.3 g/dL   Albumin 3.9 3.5 - 5.2 g/dL   Calcium 9.3 8.4 - 10.5 mg/dL   GFR 80.68 >60.00 mL/min  CBC with Differential/Platelet  Result Value Ref Range   WBC 7.2 4.0 - 10.5 K/uL   RBC 4.62 3.87 - 5.11 Mil/uL   Hemoglobin 14.1 12.0 - 15.0 g/dL   HCT 40.9 36.0 - 46.0 %   MCV 88.4 78.0 - 100.0 fl   MCHC 34.6 30.0 - 36.0 g/dL   RDW 13.4 11.5 - 15.5 %   Platelets 262.0 150.0 - 400.0 K/uL   Neutrophils Relative % 68.7 43.0 - 77.0 %   Lymphocytes Relative 24.0 12.0 - 46.0 %   Monocytes Relative 5.3 3.0 - 12.0 %   Eosinophils Relative 1.2 0.0 - 5.0 %   Basophils Relative 0.8 0.0 - 3.0 %   Neutro Abs 5.0 1.4 - 7.7 K/uL   Lymphs Abs 1.7 0.7 - 4.0 K/uL   Monocytes Absolute 0.4 0.1 - 1.0 K/uL   Eosinophils Absolute 0.1 0.0 - 0.7 K/uL   Basophils Absolute 0.1 0.0 - 0.1 K/uL  QuantiFERON-TB Gold Plus  Result Value Ref Range   QuantiFERON-TB Gold Plus NEGATIVE NEGATIVE   NIL 0.07 IU/mL   Mitogen-NIL 8.47 IU/mL   TB1-NIL <0.00 IU/mL   TB2-NIL 0.00 IU/mL  LDL cholesterol, direct  Result Value Ref Range   Direct LDL 129.0 mg/dL   Assessment & Plan:  This visit occurred during the SARS-CoV-2 public health emergency.  Safety protocols were in place, including screening questions prior to the visit, additional usage of staff PPE, and extensive cleaning of exam room while observing appropriate contact time as indicated for disinfecting solutions.    Problem List Items Addressed This Visit    Redness of left eye - Primary    Significant L eye history s/p lens replacement with tension rings for dislocation (2018), with tear duct plug in place.  Anticipate allergic conjunctivitis - not consistent with bacterial infection at this time.  Treat with patanol eye drop into left eye, regular use of preservative-free lubricating eye drops.  If not better with this, I did recommend eye doctor evaluation. She agrees with plan.       Monoallelic mutation of PALB2 gene   Relevant Orders   Ambulatory referral to Genetics   Connective tissue disorder [facility])    Requests new referral to geneticist - placed.       Relevant Orders   Ambulatory referral to Genetics       Meds ordered this encounter  Medications  . olopatadine (PATANOL) 0.1 % ophthalmic solution    Sig: Place 1 drop into the left eye 2 (two) times daily.    Dispense:  5 mL    Refill:  1   Orders Placed This Encounter  Procedures  . Ambulatory referral to Genetics    Referral Priority:   Routine    Referral Type:   Consultation    Referral Reason:   Specialty Services Required    Number of Visits Requested:   1    Patient Instructions  We will refer you to genetic clinic. I think this is more allergic  conjunctivitis of left eye - treat with lubricating eye drops and antihistamine eye drop patanol sent to pharmacy.  If not improving with this, schedule appointment with the eye doctor for a recheck.    Follow up plan: Return if symptoms worsen or fail to improve.  Ria Bush, MD

## 2019-08-09 ENCOUNTER — Other Ambulatory Visit: Payer: Self-pay

## 2019-08-09 MED ORDER — KETOTIFEN FUMARATE 0.025 % OP SOLN: 1.0000 [drp] | Freq: Two times a day (BID) | OPHTHALMIC | 0 refills | Status: DC

## 2019-08-09 NOTE — Telephone Encounter (Signed)
Received fax from [facility] requesting alternative rx for olopatadine.  States pt's cost after copay is $75.

## 2019-08-09 NOTE — Telephone Encounter (Addendum)
Recommend zaditor OTC antihistamine eye drops - I've sent to pharmacy in case any cheaper with Rx.  How is eye??

## 2019-08-09 NOTE — Telephone Encounter (Signed)
Spoke with pt relaying Dr. Synthia Innocent message.  Verbalizes understanding.  States her eye is doing much better, no issues.  Says a plug fell out of tear duct and since then, no problems.  FYI to Dr. Darnell Level.

## 2019-08-09 NOTE — Addendum Note (Signed)
Addended by: Ria Bush on: 08/09/2019 01:54 PM   Modules accepted: Orders

## 2019-10-09 ENCOUNTER — Ambulatory Visit
Admission: RE | Admit: 2019-10-09 | Discharge: 2019-10-09 | Disposition: A | Discharge status: Home / Self Care | Payer: 59 | Source: Ambulatory Visit | Attending: Obstetrics & Gynecology | Admitting: Obstetrics & Gynecology

## 2019-10-09 ENCOUNTER — Other Ambulatory Visit: Payer: Self-pay

## 2019-10-09 DIAGNOSIS — Z1231 Encounter for screening mammogram for malignant neoplasm of breast: Secondary | ICD-10-CM

## 2019-10-30 ENCOUNTER — Ambulatory Visit: Payer: 59 | Admitting: Pulmonary Disease

## 2019-10-30 ENCOUNTER — Other Ambulatory Visit: Payer: Self-pay

## 2019-10-30 ENCOUNTER — Encounter: Payer: Self-pay | Admitting: Pulmonary Disease

## 2019-10-30 VITALS — BP 118/78 | HR 86 | Temp 98.6°F | Ht 69.0 in | Wt 237.2 lb

## 2019-10-30 DIAGNOSIS — E669 Obesity, unspecified: Secondary | ICD-10-CM

## 2019-10-30 DIAGNOSIS — G4733 Obstructive sleep apnea (adult) (pediatric): Secondary | ICD-10-CM

## 2019-10-30 DIAGNOSIS — G4730 Sleep apnea, unspecified: Secondary | ICD-10-CM

## 2019-10-30 NOTE — Progress Notes (Signed)
[facility]  Chief Complaint  Patient presents with  . Consult    pt is here to see if she can get cpap machine    Constitutional:  BP 118/78 (BP Location: Left Arm, Cuff Size: Normal)   Pulse 86   Temp 98.6 F (37 C) (Oral)   Ht 5\' 9"  (1.753 m)   Wt 237 lb 3.2 oz (107.6 kg)   LMP 10/02/2019   SpO2 98%   BMI 35.03 kg/m   Past Medical History:  ADHD, Chronic back pain, Connective tissue disease, GERD, Migraine HA, Depression, IBS  Summary:  Heather Stewart is a 34 y.o. female with obstructive sleep apnea.  She works as a Marine scientist.  Subjective:  Previously seen by Dr. Ashby Dawes.  Had home sleep study in 2019.  Mild OSA with oxygen desaturation.  She was working as travel Marine scientist and was living in [place] state.  Wasn't able to get CPAP set up.  Has moved back to [place] and wants to get CPAP set up.   She is still snoring, and stops breathing at night.  She feels tired during the day.  Wakes up frequently at night and is a restless sleeper.    Physical Exam:   Appearance - well kempt  ENMT - no sinus tenderness, no nasal discharge, no oral exudate, Mallampati 3  Respiratory - no wheeze, or rales  CV - regular rate and rhythm, no murmurs  GI - soft, non tender  Lymph - no adenopathy noted in neck  Ext - no edema  Skin - no rashes  Neuro - normal strength, oriented x 3  Psych - normal mood and affect  Discussion:  She has snoring, sleep disruption, apnea, and daytime sleepiness.  She has history of mood disorder.  Previously sleep study showed mild obstructive sleep apnea.  Assessment/Plan:   Obstructive sleep apnea. - reviewed her sleep study with her - discussed treatment options - might not be candidate for oral appliance in setting of connective tissue disorder and intermittent jaw subluxation Fredderick Phenix Danlos versus Marfan syndrome) - will arrange for auto CPAP set up - explained she might need repeat home sleep  study for insurance coverage prior to getting CPAP set up  Obesity. - discussed importance of weight loss  A total of 22 minutes addressing patient care on the day of the visit.  Follow up:  Patient Instructions  Will arrange for auto CPAP set up  Follow up in 3 months   Signature:  Chesley Mires, MD [place] Pager: [phone] 10/30/2019, 12:32 PM  Flow Sheet    Sleep tests:  HST 01/18/18 >> AHI 7, SpO2 low 66%  Cardiac tests:  Echo 12/13/16 >> EF 65 to 70%  Medications:   Allergies as of 10/30/2019      Reactions   Amoxicillin-pot Clavulanate Other (See Comments), Rash   Severe GI upset Diarrhea   Neosporin [neomycin-bacitracin Zn-polymyx] Rash   Bupropion    Doxycycline Other (See Comments)   abd pain   Vyvanse [lisdexamfetamine Dimesylate] Other (See Comments)   headache      Medication List       Accurate as of October 30, 2019 12:32 PM. If you have any questions, ask your nurse or doctor.        STOP taking these medications   cetirizine 10 MG tablet Commonly known as: ZYRTEC Stopped by: Chesley Mires, MD   ketotifen 0.025 % ophthalmic solution Commonly known as: ZADITOR Stopped by:  Chesley Mires, MD   spironolactone 50 MG tablet Commonly known as: ALDACTONE Stopped by: Chesley Mires, MD   Vyvanse 40 MG capsule Generic drug: lisdexamfetamine Stopped by: Chesley Mires, MD     TAKE these medications   ALPRAZolam 1 MG tablet Commonly known as: XANAX Take 1 mg by mouth 3 (three) times daily as needed.   Biotin 1000 MCG tablet Take 1,000 mcg by mouth 3 (three) times daily.   DULoxetine 60 MG capsule Commonly known as: CYMBALTA Take 60 mg by mouth 2 (two) times daily.   FOLIC ACID PO Take 88 mcg by mouth.   loratadine 10 MG tablet Commonly known as: CLARITIN Take 10 mg by mouth daily. In the morning   metFORMIN 1000 MG tablet Commonly known as: GLUCOPHAGE Take 1,000 mg by mouth 2 (two) times daily.   MULTIVITAMIN GUMMIES  ADULT PO Take by mouth.   ondansetron 4 MG tablet Commonly known as: Zofran Take 1 tablet (4 mg total) by mouth as needed.   pantoprazole 40 MG tablet Commonly known as: PROTONIX Take 1 tablet (40 mg total) by mouth daily.   PRENATAL GUMMIES/DHA & FA PO Take by mouth.   QUEtiapine 50 MG tablet Commonly known as: SEROQUEL Take 1 tablet by mouth at bedtime.       Past Surgical History:  She  has a past surgical history that includes Myringotomy (multiple); Ganglion cyst excision; Frenulectomy, lingual (2001); Foot surgery; Esophagogastroduodenoscopy (07/2018); and Remove and replace lens (Left, 2018).  Family History:  Her family history includes Alcohol abuse in her father; Breast cancer in her maternal aunt and another family member; COPD in her paternal grandfather; Cancer in her brother; Coronary artery disease in an other family member; Heart disease in an other family member; Kidney disease in an other family member; Liver cancer in her brother; Migraines in her mother; Other in her father.  Social History:  She  reports that she has never smoked. She has never used smokeless tobacco. She reports current alcohol use. She reports that she does not use drugs.

## 2019-10-30 NOTE — Patient Instructions (Signed)
Will arrange for auto CPAP set up  Follow up in 3 months 

## 2019-10-31 ENCOUNTER — Telehealth: Payer: Self-pay | Admitting: Internal Medicine

## 2019-10-31 NOTE — Telephone Encounter (Signed)
Will route this request to Dr. Harrington Challenger to further review and advise on requested letter.  Triage nursing to follow-up with the pt accordingly thereafter, once further advisement received.

## 2019-10-31 NOTE — Telephone Encounter (Signed)
New Message   Patient is calling because she is needing a letter stating that she is okay to have cool tone and cool sculpting. She indicates that it can be sent her via mychart. She is insisting on the letter. Please call to discuss.

## 2019-11-01 NOTE — Telephone Encounter (Signed)
The patient has not been seen by Dr. Harrington Challenger since 2018. Appointment was made for the patient in August as she is requesting a letter clearing her to get "cool sculpting or cool tone". I let her know she has also been placed on the wait list as well in case an earlier appointment comes available.

## 2019-11-01 NOTE — Telephone Encounter (Signed)
Patient calling to follow up on her letter.

## 2019-11-04 ENCOUNTER — Encounter: Payer: Self-pay | Admitting: Internal Medicine

## 2019-11-04 NOTE — Telephone Encounter (Signed)
Letter dictated   Please forward to pt

## 2019-11-06 NOTE — Telephone Encounter (Signed)
Called patient to let her know letter is available. No answer on phone and was unable to leave VM. Message to her in [place]. Letter forwarded into MyChart.

## 2019-11-08 ENCOUNTER — Encounter: Payer: Self-pay | Admitting: *Deleted

## 2019-11-08 NOTE — Telephone Encounter (Signed)
Letter edited to remove plastic surgery procedures. The patient wrote in that she is planning cool tone and cool sculpting procedures, not surgery.

## 2019-12-12 ENCOUNTER — Telehealth: Payer: Self-pay

## 2019-12-12 NOTE — Telephone Encounter (Signed)
Did she test positive for strep with rapid strep or throat culture?  If positive strep A (typical strep causing pharyngitis), keflex is good treatment option.

## 2019-12-12 NOTE — Telephone Encounter (Signed)
Patient was seen at an UC yesterday and is being treated for strep throat. She states they sent in Keflex for her, and she is wondering if this is approproate treatment? She states her lymph nodes in her neck are so swollen and the size of a kiwi, and that her throat is extremely sore. She states she does not know if maybe she needs a different abx?

## 2019-12-12 NOTE — Telephone Encounter (Signed)
Spoke with pt asking about the strep test.   Pt states the test showed positive as soon as it started.  She is not sure if it was strep A.  I relayed Dr. Synthia Innocent message.  Pt verbalizes understanding.  FYI to Dr. Darnell Level.

## 2019-12-17 ENCOUNTER — Telehealth: Payer: Self-pay

## 2019-12-17 ENCOUNTER — Ambulatory Visit
Admission: EM | Admit: 2019-12-17 | Discharge: 2019-12-17 | Disposition: A | Discharge status: Home / Self Care | Payer: 59 | Attending: Physician Assistant | Admitting: Physician Assistant

## 2019-12-17 DIAGNOSIS — R52 Pain, unspecified: Secondary | ICD-10-CM

## 2019-12-17 DIAGNOSIS — J029 Acute pharyngitis, unspecified: Secondary | ICD-10-CM

## 2019-12-17 LAB — POC SARS CORONAVIRUS 2 AG -  ED: SARS Coronavirus 2 Ag: NEGATIVE

## 2019-12-17 MED ORDER — LIDOCAINE VISCOUS HCL 2 % MT SOLN
15.0000 mL | OROMUCOSAL | 0 refills | Status: DC | PRN
Start: 2019-12-17 — End: 2020-07-13

## 2019-12-17 MED ORDER — LIDOCAINE VISCOUS HCL 2 % MT SOLN
15.0000 mL | OROMUCOSAL | 0 refills | Status: DC | PRN
Start: 2019-12-17 — End: 2019-12-17

## 2019-12-17 NOTE — Telephone Encounter (Signed)
Patient called back stating she is currently at [facility] getting tested for covid and strep.Please call patient back for any question or concerns. [phone]

## 2019-12-17 NOTE — Telephone Encounter (Signed)
Thank you.  Lisa plz touch base with patient to see what she decided about covid test vs [facility] recheck.

## 2019-12-17 NOTE — ED Triage Notes (Signed)
Pt dx with Strep throat last week.  Improved with abx for approx three days.  Sore throat and white patches have returned this morning.  Low grade temp the last few days. Lymph nodes swollen and tender.  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

## 2019-12-17 NOTE — Telephone Encounter (Signed)
Patient contacted the office and states she was seen last Wednesday at an Urgent Care last Wednesday and was treated for strep throat. She states that she finished up her abx (Keflex) yesterday, and her glands are still very swollen, and she still has white patches in the back of her throat. 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 Patient is wondering if she needs another abx, or what more needs to be done?

## 2019-12-17 NOTE — Telephone Encounter (Signed)
Lvm asking pt to call back.  Need to know if pt decided to have COVID test or be rechecked at [facility].

## 2019-12-17 NOTE — Discharge Instructions (Addendum)
Rapid COVID negative. COVID PCR testing ordered. I would like you to quarantine until testing results. Finish course of keflex. Start lidocaine for sore throat, do not eat or drink for the next 40 mins after use as it can stunt your gag reflex. You can take over the counter flonase/nasacort to help with nasal congestion/drainage. Tylenol/motrin for pain and fever. Keep hydrated, urine should be clear to pale yellow in color. If experiencing shortness of breath, trouble breathing, go to the emergency department for further evaluation needed.

## 2019-12-17 NOTE — ED Provider Notes (Signed)
[facility]    CSN: [id number] Arrival date & time: 12/17/19  1417      History   Chief Complaint Chief Complaint  Patient presents with  . Sore Throat    HPI Heather Stewart is a 34 y.o. female.   34 year old female comes in for URI symptoms.  Started with sore throat, body aches, chills last week, and was diagnosed with strep.  Was started on Keflex.  States symptoms slightly improved, but returned a few days ago.  Has rhinorrhea with intermittent cough.  No nasal congestion.  T-max 101.3 last week, no fevers this week.  Denies abdominal pain, nausea, vomiting, diarrhea.  Denies loss of taste or smell.  States has some shortness of breath during N95 fitting, but otherwise no shortness of breath.  Has not missed dosage for Keflex, and has 3 days left.     Past Medical History:  Diagnosis Date  . ADHD (attention deficit hyperactivity disorder), combined type    dx as child and then received through psychiatry - reviewed records from psych: no recent w/u.  was on vyvanse during nursing school  . Adjustment disorder   . Chronic back pain    thoracic and lumbar - s/p unrevealing NSG eval   . Connective tissue disease ([place])    ocular lens dislocation, flat feet, high palate, no cardiac involvement  . Esophageal reflux   . Eye problems 08/2011   subluxated lenses OU, rec test for homocyetinuria to r/o as cause (see scanned form)  . Focal nodular hyperplasia of liver 11/10/2018   By MRI at [facility] 07/2018 - see scanned report  . Hx of migraines   . Irritable bowel syndrome   . MDD (major depressive disorder), recurrent episode ([place])   . Screening examination for pulmonary tuberculosis   . Swelling, mass, or lump in head and neck   . Uvulitis 10/02/2018    Patient Active Problem List   Diagnosis Date Noted  . Redness of left eye 08/01/2019  . Breast cancer screening, high risk patient 01/29/2019  . Focal nodular hyperplasia of liver 11/10/2018  .  Adult acne 11/01/2018  . Allergic rhinitis 11/01/2018  . Hypertriglyceridemia 10/29/2018  . Monoallelic mutation of PALB2 gene 05/14/2018  . Generalized hypermobility of joints 05/06/2016  . Pedal edema 07/10/2014  . Left serous otitis media 07/31/2012  . Large breasts 02/13/2012  . Myopia with astigmatism 09/13/2011  . Subluxation of both lenses 09/13/2011  . Chest pain 09/06/2011  . Connective tissue disorder ([place]) 03/08/2011  . Healthcare maintenance 03/08/2011  . GERD 07/08/2010  . ABDOMINAL PAIN, EPIGASTRIC 07/08/2010  . MDD (major depressive disorder), recurrent episode, moderate ([place]) 05/12/2010  . IRRITABLE BOWEL SYNDROME 05/08/2009  . DIZZINESS 05/08/2009  . SWELLING MASS OR LUMP IN HEAD AND NECK 09/05/2008  . MIGRAINES, HX OF 10/03/2007  . ADHD 10/03/2007    Past Surgical History:  Procedure Laterality Date  . ESOPHAGOGASTRODUODENOSCOPY  07/2018   gastric polyps biopsied, mild acute ?alcoholic gastritis (Dr Jabier Mutton in [place])  . FOOT SURGERY     Right-glass removed in OR  . [place], [place]  . GANGLION CYST EXCISION     Left wrist  . MYRINGOTOMY  multiple   had tissue from behind ears implanted into bilateral TMs to close (1990s)  . REMOVE AND REPLACE LENS Left 2018   2 tension rings placed, lens replaced Isaiah Blakes) [place] [facility]    OB History   No obstetric history on file.  Home Medications    Prior to Admission medications   Medication Sig Start Date End Date Taking? Authorizing Provider  ALPRAZolam Duanne Moron) 1 MG tablet Take 1 mg by mouth 3 (three) times daily as needed.  03/27/18  Yes [provider]  Biotin 1000 MCG tablet Take 1,000 mcg by mouth 3 (three) times daily.   Yes [provider]  DULoxetine (CYMBALTA) 60 MG capsule Take 60 mg by mouth 2 (two) times daily.   Yes [provider]  FOLIC ACID PO Take 88 mcg by mouth.   Yes [provider]  loratadine (CLARITIN) 10 MG tablet Take 10 mg  by mouth daily. In the morning   Yes [provider]  metFORMIN (GLUCOPHAGE) 1000 MG tablet Take 1,000 mg by mouth 2 (two) times daily. 10/16/19  Yes [provider]  Multiple Vitamins-Minerals (MULTIVITAMIN GUMMIES ADULT PO) Take by mouth.   Yes [provider]  ondansetron (ZOFRAN) 4 MG tablet Take 1 tablet (4 mg total) by mouth as needed. 11/01/18  Yes Ria Bush, MD  pantoprazole (PROTONIX) 40 MG tablet Take 1 tablet (40 mg total) by mouth daily. 11/01/18  Yes Ria Bush, MD  Prenatal MV-Min-FA-Omega-3 (PRENATAL GUMMIES/DHA & FA PO) Take by mouth.   Yes [provider]  QUEtiapine (SEROQUEL) 50 MG tablet Take 1 tablet by mouth at bedtime.   Yes [provider]  lidocaine (XYLOCAINE) 2 % solution Use as directed 15 mLs in the mouth or throat as needed for mouth pain. 12/17/19   Ok Edwards, PA-C    Family History Family History  Problem Relation Age of Onset  . Alcohol abuse Father   . Other Father        Spinal problem  . Migraines Mother   . Coronary artery disease Other        GF 5v bypass  . COPD Paternal Grandfather   . Breast cancer Maternal Aunt        x2  . Cancer Brother        HALF brother (had kidney removed at 44 months-then died of cancer)  . Breast cancer Other        Aunts x3  . Heart disease Other        MGF, PGF, GGM  . Kidney disease Other        GGM  . Liver cancer Brother     Social History Social History   Tobacco Use  . Smoking status: Never Smoker  . Smokeless tobacco: Never Used  Substance Use Topics  . Alcohol use: Yes    Comment: occ wine  . Drug use: No     Allergies   Amoxicillin-pot clavulanate, Neosporin [neomycin-bacitracin zn-polymyx], Bupropion, Doxycycline, and Vyvanse [lisdexamfetamine dimesylate]   Review of Systems Review of Systems  Reason unable to perform ROS: See HPI as above.     Physical Exam Triage Vital Signs ED Triage Vitals  Enc Vitals Group     BP 12/17/19  1437 127/83     Pulse Rate 12/17/19 1437 93     Resp 12/17/19 1437 18     Temp 12/17/19 1437 98.8 F (37.1 C)     Temp Source 12/17/19 1437 Oral     SpO2 12/17/19 1437 96 %     Weight --      Height --      Head Circumference --      Peak Flow --      Pain Score 12/17/19 1442 2  Pain Loc --      Pain Edu? --      Excl. in [place]? --    No data found.  Updated Vital Signs BP 127/83 (BP Location: Left Arm)   Pulse 93   Temp 98.8 F (37.1 C) (Oral)   Resp 18   LMP 11/28/2019 (Exact Date)   SpO2 96%   Physical Exam Constitutional:      General: She is not in acute distress.    Appearance: Normal appearance. She is well-developed. She is not ill-appearing, toxic-appearing or diaphoretic.  HENT:     Head: Normocephalic and atraumatic.     Right Ear: Tympanic membrane, ear canal and external ear normal. Tympanic membrane is not erythematous or bulging.     Left Ear: Tympanic membrane, ear canal and external ear normal. Tympanic membrane is not erythematous or bulging.     Nose:     Right Sinus: No maxillary sinus tenderness or frontal sinus tenderness.     Left Sinus: No maxillary sinus tenderness or frontal sinus tenderness.     Mouth/Throat:     Mouth: Mucous membranes are moist.     Pharynx: Oropharynx is clear. Uvula midline.     Tonsils: Tonsillar exudate present. 1+ on the right. 1+ on the left.  Eyes:     Conjunctiva/sclera: Conjunctivae normal.     Pupils: Pupils are equal, round, and reactive to light.  Cardiovascular:     Rate and Rhythm: Normal rate and regular rhythm.  Pulmonary:     Effort: Pulmonary effort is normal. No accessory muscle usage, prolonged expiration, respiratory distress or retractions.     Breath sounds: No decreased air movement or transmitted upper airway sounds. No decreased breath sounds.     Comments: LCTAB Musculoskeletal:     Cervical back: Normal range of motion and neck supple.  Skin:    General: Skin is warm and dry.    Neurological:     Mental Status: She is alert and oriented to person, place, and time.      UC Treatments / Results  Labs (all labs ordered are listed, but only abnormal results are displayed) Labs Reviewed  NOVEL CORONAVIRUS, NAA  POC SARS CORONAVIRUS 2 AG -  ED    EKG   Radiology No results found.  Procedures Procedures (including critical care time)  Medications Ordered in UC Medications - No data to display  Initial Impression / Assessment and Plan / UC Course  I have reviewed the triage vital signs and the nursing notes.  Pertinent labs & imaging results that were available during my care of the patient were reviewed by me and considered in my medical decision making (see chart for details).    Given still on Keflex, rapid strep deferred.  Rapid Covid negative.  Covid PCR sent.  Patient to quarantine until testing results return.  Symptomatic treatment discussed.  Return precautions given.  Final Clinical Impressions(s) / UC Diagnoses   Final diagnoses:  Sore throat  Body aches    ED Prescriptions    Medication Sig Dispense Auth. Provider   lidocaine (XYLOCAINE) 2 % solution  (Status: Discontinued) Use as directed 15 mLs in the mouth or throat as needed for mouth pain. 100 mL Elynor Kallenberger V, PA-C   lidocaine (XYLOCAINE) 2 % solution Use as directed 15 mLs in the mouth or throat as needed for mouth pain. 100 mL Ok Edwards, PA-C     PDMP not reviewed this encounter.   Tasia Catchings, Leonora Gores  V, PA-C 12/17/19 1823

## 2019-12-17 NOTE — Telephone Encounter (Signed)
Patient called office stating the strep was getting better and now is getting worse. She states the white patches in her throat are back and wearing a mask is difficult for her because she cant properly breathe. She does have a few more days left on antibiotics and isn't taking anything else other than ibuprofen for discomfort. I informed her to try the honey lemon, herbal tea and salt water gargles in the mean time, as well as getting a covid test to see if we would be able to see her in office.

## 2019-12-17 NOTE — Telephone Encounter (Addendum)
Keflex bid 10d course should cover strep throat. She should still have 4 more days of antibiotic? It was 10d course, started 12/11/2019.  May need more time to get over infection. Is she taking NSAIDs to help with throat inflammation/pain? Also would recommend salt water gargles, honey with lemon, herbal tea etc for symptom relief. Given associated dyspnea would suggest/recommend COVID test and if negative can schedule OV for further evaluation. Alternatively can get rechecked at [facility].

## 2019-12-18 LAB — NOVEL CORONAVIRUS, NAA: SARS-CoV-2, NAA: NOT DETECTED

## 2019-12-18 LAB — SARS-COV-2, NAA 2 DAY TAT

## 2020-01-16 ENCOUNTER — Telehealth: Payer: Self-pay | Admitting: Genetic Counselor

## 2020-01-16 NOTE — Telephone Encounter (Signed)
Patient declined genetic counseling for hereditary cancer and discussion of risks associated with PALB2 mutation.  Appointment for Monday January 20, 2020 at 9am was cancelled at the request of the patient.  

## 2020-01-17 ENCOUNTER — Ambulatory Visit: Payer: 59 | Admitting: Internal Medicine

## 2020-01-20 ENCOUNTER — Encounter: Payer: 59 | Admitting: Genetic Counselor

## 2020-03-23 ENCOUNTER — Ambulatory Visit: Payer: 59 | Admitting: Internal Medicine

## 2020-04-08 ENCOUNTER — Other Ambulatory Visit: Payer: Self-pay | Admitting: Obstetrics & Gynecology

## 2020-04-08 ENCOUNTER — Other Ambulatory Visit: Payer: Self-pay | Admitting: Diagnostic Radiology

## 2020-04-08 DIAGNOSIS — R898 Other abnormal findings in specimens from other organs, systems and tissues: Secondary | ICD-10-CM

## 2020-04-12 ENCOUNTER — Ambulatory Visit
Admission: RE | Admit: 2020-04-12 | Discharge: 2020-04-12 | Disposition: A | Discharge status: Home / Self Care | Payer: BC Managed Care – PPO | Source: Ambulatory Visit | Attending: Obstetrics & Gynecology | Admitting: Obstetrics & Gynecology

## 2020-04-12 ENCOUNTER — Other Ambulatory Visit: Payer: Self-pay

## 2020-04-12 DIAGNOSIS — R898 Other abnormal findings in specimens from other organs, systems and tissues: Secondary | ICD-10-CM

## 2020-04-12 MED ORDER — GADOBUTROL 1 MMOL/ML IV SOLN
10.0000 mL | Freq: Once | INTRAVENOUS | Status: AC | PRN
  Administered 2020-04-12: 10 mL via INTRAVENOUS

## 2020-04-17 ENCOUNTER — Ambulatory Visit: Payer: 59 | Admitting: Internal Medicine

## 2020-07-03 ENCOUNTER — Other Ambulatory Visit (HOSPITAL_COMMUNITY): Payer: Self-pay | Admitting: Physician Assistant

## 2020-07-10 ENCOUNTER — Other Ambulatory Visit (HOSPITAL_COMMUNITY): Payer: Self-pay | Admitting: Physician Assistant

## 2020-07-13 ENCOUNTER — Encounter: Payer: Self-pay | Admitting: Internal Medicine

## 2020-07-13 ENCOUNTER — Encounter: Payer: Self-pay | Admitting: *Deleted

## 2020-07-13 ENCOUNTER — Ambulatory Visit: Payer: BC Managed Care – PPO | Admitting: Internal Medicine

## 2020-07-13 ENCOUNTER — Other Ambulatory Visit: Payer: Self-pay

## 2020-07-13 ENCOUNTER — Ambulatory Visit (INDEPENDENT_AMBULATORY_CARE_PROVIDER_SITE_OTHER): Payer: BC Managed Care – PPO

## 2020-07-13 VITALS — BP 102/70 | HR 76 | Ht 69.0 in | Wt 227.4 lb

## 2020-07-13 DIAGNOSIS — R55 Syncope and collapse: Secondary | ICD-10-CM

## 2020-07-13 DIAGNOSIS — R2991 Unspecified symptoms and signs involving the musculoskeletal system: Secondary | ICD-10-CM | POA: Diagnosis not present

## 2020-07-13 NOTE — Progress Notes (Signed)
Cardiology Office Note:    Date:  07/13/2020   ID:  Heather Stewart, DOB February 17, 1986, MRN [id number]  PCP:  Ria Bush, [place]  Cardiologist:  No primary care provider on file.  Advanced Practice Provider:  No care team member to display Electrophysiologist:  None       CC: Passed out  Consulted for the evaluation of chest pain at the behest of Ria Bush, MD   History of Present Illness:    Heather Stewart is a 35 y.o. female with a hx of hypertriglyceridemia, connective tissue disorder NOS (ocular lens disorder, palate, flat feet, scoliosis, severe myopia wrist sign) with CP who presents for evaluation 07/13/20.  Patient notes that she is feeling OK- but she passed out.  Patient has been having new palpitations with exertion.  Heart heart rate will get to 120 with light activity.  With this she feels nauseous but without CP or DOE.  Last week, patient was getting up from her couch and felt faint, checked her watch and herheart rate in the 140s .  Things went black and she passed out.  Woke up saw that she must have grabbed a baby gate for support.  Has had no chest pain, chest pressure, chest tightness, chest stinging.  Still feels palpitations almost dail Feels palpitations 2-3 times; daily but without syncopal feelings apart from this one episode.  Discomfort occurs sponatneously, with exertion, and with positional changes, worsens with activity, and improves with rest.  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  Feels better without N95 and with PAPR. No shortness of breath at rest.  Cant exercise due to fast heart rates.  No PND or orthopnea.  No bendopnea,   Patient reports prior cardiac testing including CTPE that was normal in 2020- found incidental mass  No history of  early menarche, or prematurity.  Has never been pregnant.   Past Medical History:  Diagnosis Date  . ADHD  (attention deficit hyperactivity disorder), combined type    dx as child and then received through psychiatry - reviewed records from psych: no recent w/u.  was on vyvanse during nursing school  . Adjustment disorder   . Chronic back pain    thoracic and lumbar - s/p unrevealing NSG eval   . Connective tissue disease ([place])    ocular lens dislocation, flat feet, high palate, no cardiac involvement  . Esophageal reflux   . Eye problems 08/2011   subluxated lenses OU, rec test for homocyetinuria to r/o as cause (see scanned form)  . Focal nodular hyperplasia of liver 11/10/2018   By MRI at [facility] 07/2018 - see scanned report  . Hx of migraines   . Irritable bowel syndrome   . MDD (major depressive disorder), recurrent episode ([place])   . Screening examination for pulmonary tuberculosis   . Swelling, mass, or lump in head and neck   . Uvulitis 10/02/2018    Past Surgical History:  Procedure Laterality Date  . ESOPHAGOGASTRODUODENOSCOPY  07/2018   gastric polyps biopsied, mild acute ?alcoholic gastritis (Dr Jabier Mutton in [place])  . FOOT SURGERY     Right-glass removed in OR  . [place], [place]  . GANGLION CYST EXCISION     Left wrist  . MYRINGOTOMY  multiple   had tissue from behind ears implanted into bilateral TMs to close (1990s)  . REMOVE AND REPLACE LENS  Left 2018   2 tension rings placed, lens replaced Isaiah Blakes) [facility]    Current Medications: Current Meds  Medication Sig  . DULoxetine (CYMBALTA) 60 MG capsule Take 60 mg by mouth 2 (two) times daily.  Marland Kitchen loratadine (CLARITIN) 10 MG tablet Take 10 mg by mouth daily. In the morning  . Multiple Vitamins-Minerals (MULTIVITAMIN GUMMIES ADULT PO) Take 1 tablet by mouth daily at 12 noon.  . ondansetron (ZOFRAN) 4 MG tablet Take 1 tablet (4 mg total) by mouth as needed.  . pantoprazole (PROTONIX) 40 MG tablet Take 1 tablet (40 mg total) by mouth daily.  . QUEtiapine (SEROQUEL) 50 MG tablet Take 1  tablet by mouth at bedtime.  Marland Kitchen spironolactone (ALDACTONE) 50 MG tablet Take 50 mg by mouth 2 (two) times daily.     Allergies:   Amoxicillin-pot clavulanate, Neosporin [neomycin-bacitracin zn-polymyx], Bupropion, Doxycycline, and Vyvanse [lisdexamfetamine dimesylate]   Social History   Socioeconomic History  . Marital status: Single    Spouse name: Not on file  . Number of children: 0  . Years of education: Not on file  . Highest education level: Not on file  Occupational History  . Occupation: [employer]    Employer: [employer]  Tobacco Use  . Smoking status: Never Smoker  . Smokeless tobacco: Never Used  Substance and Sexual Activity  . Alcohol use: Yes    Comment: occ wine  . Drug use: No  . Sexual activity: Yes    Birth control/protection: None  Other Topics Concern  . Not on file  Social History Narrative   Single      No children      Caffeine: 1 coke occasionally      Works at [employer] alone, 1 dog   Social Determinants of Radio broadcast assistant Strain: Not on file  Food Insecurity: Not on file  Transportation Needs: Not on file  Physical Activity: Not on file  Stress: Not on file  Social Connections: Not on file     Family History: The patient's family history includes Alcohol abuse in her father; Breast cancer in her maternal aunt and another family member; COPD in her paternal grandfather; Cancer in her brother; Coronary artery disease in an other family member; Heart disease in an other family member; Kidney disease in an other family member; Liver cancer in her brother; Migraines in her mother; Other in her father. History of coronary artery disease notable for grandfather, grandmother. History of heart failure notable for no members. No Marfan's Disease. No history of cardiomyopathies including hypertrophic cardiomyopathy, left ventricular non-compaction, or arrhythmogenic right  ventricular cardiomyopathy. History of arrhythmia notable for father with atrial fibrillation. Denies family history of sudden cardiac death including drowning, car accidents, or unexplained deaths in the family. No history of bicuspid aortic valve or aortic aneurysm or dissection.   ROS:   Please see the history of present illness.     All other systems reviewed and are negative.  EKGs/Labs/Other Studies Reviewed:    The following studies were reviewed today:  EKG:   07/13/2020: SR rate 76 09/22/2014: SR 74  Transthoracic Echocardiogram: Date: 12/13/2016 Results: Study Conclusions   - Left ventricle: The cavity size was normal. Systolic function was  vigorous. The estimated ejection fraction was in the range of 65%  to 70%. Wall motion was normal; there were no regional wall  motion abnormalities. Left ventricular diastolic function  parameters were normal.  - Aortic valve: Trileaflet; normal thickness leaflets.  - Aortic root: The aortic root was normal in size.  - Ascending aorta: The ascending aorta was normal in size.  - Right ventricle: Systolic function was normal.  - Right atrium: The atrium was normal in size.  - Tricuspid valve: There was trivial regurgitation.  - Pulmonary arteries: Systolic pressure was within the normal  range.  - Inferior vena cava: The vessel was normal in size.   Impressions:   - Normal study.   CPET: Date: 10/10/2014 Results: Conclusion: The interpretation of this test is limited due to  submaximal exercise. Based on available data, the patient has an  excellent functional capacity when compared to matched sedentary  norms. There is no clear cardiopulmonary limitation to exercise.  The patient's symptoms may be related to her body habitus. Given  the mildly elevated Ve?VC2 slope and family history of connective  tissue disease, if symptoms persist, consider repeat testing in  6-12 months.   Recent Labs: No results found  for requested labs within last 8760 hours.  Recent Lipid Panel    Component Value Date/Time   CHOL 196 10/31/2018 1019   TRIG 278.0 (H) 10/31/2018 1019   HDL 34.70 (L) 10/31/2018 1019   CHOLHDL 6 10/31/2018 1019   VLDL 55.6 (H) 10/31/2018 1019   LDLCALC 99 02/25/2011 1450   LDLDIRECT 129.0 10/31/2018 1019   Risk Assessment/Calculations:     N/A  Physical Exam:    VS:  BP 102/70   Pulse 76   Ht 5\' 9"  (1.753 m)   Wt 227 lb 6.4 oz (103.1 kg)   SpO2 98%   BMI 33.58 kg/m     [place] Score: 7 Arm span 78 cm Leg span 88 cm Positive Wrist sign Flat Feet Notable for flat feet No pes excavatum Abdominal striae  Wt Readings from Last 3 Encounters:  07/13/20 227 lb 6.4 oz (103.1 kg)  10/30/19 237 lb 3.2 oz (107.6 kg)  08/01/19 229 lb (103.9 kg)    GEN:  Well nourished, well developed in no acute distress HEENT: Normal NECK: No JVD; No carotid bruits LYMPHATICS: No lymphadenopathy CARDIAC: RRR, no murmurs, rubs, gallops RESPIRATORY:  Clear to auscultation without rales, wheezing or rhonchi  ABDOMEN: Soft, non-tender, non-distended MUSCULOSKELETAL:  No edema; deformities as above SKIN: Warm and dry NEUROLOGIC:  Alert and oriented x 3 PSYCHIATRIC:  Normal affect   ASSESSMENT:    1. Syncope and collapse   2. Marfanoid habitus    PLAN:    In order of problems listed above:  Syncope - Possible vasovagal medicated event, but higher risk in the setting of activity induced -with risk of tachyarrhythmia- will get exercise stress test and ZioPatch 2 Week (patient was COVID +; health at work has these results, and was this was within 90 days should not need COVID re-testing - at next assessment will evaluate for POTS if negative work up  [place] - without history of dissection in the family - will get echocardiogram  Three month follow up unless new symptoms or abnormal test results warranting change in plan  Medication Adjustments/Labs and Tests  Ordered: Current medicines are reviewed at length with the patient today.  Concerns regarding medicines are outlined above.  Orders Placed This Encounter  Procedures  . Cardiac Stress Test: Informed Consent Details: Physician/Practitioner Attestation; Transcribe to consent form and obtain patient signature  . EXERCISE TOLERANCE TEST (ETT)  . LONG TERM MONITOR (3-14 DAYS)  .  EKG 12-Lead  . ECHOCARDIOGRAM COMPLETE   No orders of the defined types were placed in this encounter.   Patient Instructions  Medication Instructions:  Your physician recommends that you continue on your current medications as directed. Please refer to the Current Medication list given to you today.  *If you need a refill on your cardiac medications before your next appointment, please call your pharmacy*   Lab Work: NONE If you have labs (blood work) drawn today and your tests are completely normal, you will receive your results only by: Marland Kitchen MyChart Message (if you have MyChart) OR . A paper copy in the mail If you have any lab test that is abnormal or we need to change your treatment, we will call you to review the results.   Testing/Procedures: Your physician has requested that you have an echocardiogram. Echocardiography is a painless test that uses sound waves to create images of your heart. It provides your doctor with information about the size and shape of your heart and how well your heart's chambers and valves are working. This procedure takes approximately one hour. There are no restrictions for this procedure.  Your physician has requested that you have an exercise tolerance test.         COVID TEST-- Exempt due to positive COVID in December.  Will request records from Health at Work.          Follow-Up: At [facility], you and your health needs are our priority.  As part of our continuing mission to provide you with exceptional heart care, we have created designated Provider Care Teams.   These Care Teams include your primary Cardiologist (physician) and Advanced Practice Providers (APPs -  Physician Assistants and Nurse Practitioners) who all work together to provide you with the care you need, when you need it.   Your next appointment:   3 month(s)  The format for your next appointment:   In Person  Provider:   You may see Gasper Sells, MD      Other Instructions  ZIO XT- Long Term Monitor Instructions   Your physician has requested you wear your ZIO patch monitor__14__days This is a single patch monitor.  [employer] supplies one patch monitor per enrollment.  Additional stickers are not available.   Please do not apply patch if you will be having a Nuclear Stress Test, Echocardiogram, Cardiac CT, MRI, or Chest Xray during the time frame you would be wearing the monitor. The patch cannot be worn during these tests.  You cannot remove and re-apply the ZIO XT patch monitor.   Your ZIO patch monitor will be sent USPS Priority mail from [facility] directly to your home address. The monitor may also be mailed to a PO BOX if home delivery is not available.   It may take 3-5 days to receive your monitor after you have been enrolled.   Once you have received you monitor, please review enclosed instructions.  Your monitor has already been registered assigning a specific monitor serial # to you.   Applying the monitor   Shave hair from upper left chest.   Hold abrader disc by orange tab.  Rub abrader in 40 strokes over left upper chest as indicated in your monitor instructions.   Clean area with 4 enclosed alcohol pads .  Use all pads to assure are is cleaned thoroughly.  Let dry.   Apply patch as indicated in monitor instructions.  Patch will be place under collarbone on left side of  chest with arrow pointing upward.   Rub patch adhesive wings for 2 minutes.Remove white label marked "1".  Remove white label marked "2".  Rub patch adhesive wings for 2 additional  minutes.   While looking in a mirror, press and release button in center of patch.  A small green light will flash 3-4 times .  This will be your only indicator the monitor has been turned on.     Do not shower for the first 24 hours.  You may shower after the first 24 hours.   Press button if you feel a symptom. You will hear a small click.  Record Date, Time and Symptom in the Patient Log Book.   When you are ready to remove patch, follow instructions on last 2 pages of Patient Log Book.  Stick patch monitor onto last page of Patient Log Book.   Place Patient Log Book in [place] box.  Use locking tab on box and tape box closed securely.  The [employer] and [employer] has [employer] on it.  Please place in mailbox as soon as possible.  Your physician should have your test results approximately 7 days after the monitor has been mailed back to [facility].   Call [place] at [phone] if you have questions regarding your ZIO XT patch monitor.  Call them immediately if you see an orange light blinking on your monitor.   If your monitor falls off in less than 4 days contact our Monitor department at [phone].  If your monitor becomes loose or falls off after 4 days call [employer] at [phone] for suggestions on securing your monitor.       Signed, Werner Lean, MD  07/13/2020 5:10 PM    [place]

## 2020-07-13 NOTE — Progress Notes (Signed)
Patient ID: Heather Stewart, female   DOB: 04/23/1986, 35 y.o.   MRN: [id number] Patient enrolled for [employer] to ship a 14 day ZIO XT long term holter montior to her home.

## 2020-07-13 NOTE — Patient Instructions (Addendum)
Medication Instructions:  Your physician recommends that you continue on your current medications as directed. Please refer to the Current Medication list given to you today.  *If you need a refill on your cardiac medications before your next appointment, please call your pharmacy*   Lab Work: NONE If you have labs (blood work) drawn today and your tests are completely normal, you will receive your results only by: Marland Kitchen MyChart Message (if you have MyChart) OR . A paper copy in the mail If you have any lab test that is abnormal or we need to change your treatment, we will call you to review the results.   Testing/Procedures: Your physician has requested that you have an echocardiogram. Echocardiography is a painless test that uses sound waves to create images of your heart. It provides your doctor with information about the size and shape of your heart and how well your heart's chambers and valves are working. This procedure takes approximately one hour. There are no restrictions for this procedure.  Your physician has requested that you have an exercise tolerance test.         COVID TEST-- Exempt due to positive COVID in December.  Will request records from [facility].          Follow-Up: At [facility], you and your health needs are our priority.  As part of our continuing mission to provide you with exceptional heart care, we have created designated Provider Care Teams.  These Care Teams include your primary Cardiologist (physician) and Advanced Practice Providers (APPs -  Physician Assistants and Nurse Practitioners) who all work together to provide you with the care you need, when you need it.   Your next appointment:   3 month(s)  The format for your next appointment:   In Person  Provider:   You may see Gasper Sells, MD      Other Instructions  ZIO XT- Long Term Monitor Instructions   Your physician has requested you wear your ZIO patch  monitor__14__days This is a single patch monitor.  [employer] supplies one patch monitor per enrollment.  Additional stickers are not available.   Please do not apply patch if you will be having a Nuclear Stress Test, Echocardiogram, Cardiac CT, MRI, or Chest Xray during the time frame you would be wearing the monitor. The patch cannot be worn during these tests.  You cannot remove and re-apply the ZIO XT patch monitor.   Your ZIO patch monitor will be sent USPS Priority mail from [facility] directly to your home address. The monitor may also be mailed to a PO BOX if home delivery is not available.   It may take 3-5 days to receive your monitor after you have been enrolled.   Once you have received you monitor, please review enclosed instructions.  Your monitor has already been registered assigning a specific monitor serial # to you.   Applying the monitor   Shave hair from upper left chest.   Hold abrader disc by orange tab.  Rub abrader in 40 strokes over left upper chest as indicated in your monitor instructions.   Clean area with 4 enclosed alcohol pads .  Use all pads to assure are is cleaned thoroughly.  Let dry.   Apply patch as indicated in monitor instructions.  Patch will be place under collarbone on left side of chest with arrow pointing upward.   Rub patch adhesive wings for 2 minutes.Remove white label marked "1".  Remove white label marked "2".  Rub patch adhesive wings for 2 additional minutes.   While looking in a mirror, press and release button in center of patch.  A small green light will flash 3-4 times .  This will be your only indicator the monitor has been turned on.     Do not shower for the first 24 hours.  You may shower after the first 24 hours.   Press button if you feel a symptom. You will hear a small click.  Record Date, Time and Symptom in the Patient Log Book.   When you are ready to remove patch, follow instructions on last 2 pages of Patient Log  Book.  Stick patch monitor onto last page of Patient Log Book.   Place Patient Log Book in [place] box.  Use locking tab on box and tape box closed securely.  The [employer] has [employer] on it.  Please place in mailbox as soon as possible.  Your physician should have your test results approximately 7 days after the monitor has been mailed back to [facility].   Call [place] at [phone] if you have questions regarding your ZIO XT patch monitor.  Call them immediately if you see an orange light blinking on your monitor.   If your monitor falls off in less than 4 days contact our Monitor department at [phone].  If your monitor becomes loose or falls off after 4 days call [employer] at [phone] for suggestions on securing your monitor.

## 2020-07-17 ENCOUNTER — Telehealth: Payer: Self-pay

## 2020-07-17 NOTE — Telephone Encounter (Signed)
Received positive covid test results from 05/22/2020.  Placed in medical records box to be scanned into chart.

## 2020-07-19 DIAGNOSIS — R55 Syncope and collapse: Secondary | ICD-10-CM | POA: Diagnosis not present

## 2020-07-30 ENCOUNTER — Other Ambulatory Visit (HOSPITAL_COMMUNITY): Payer: Self-pay | Admitting: Physician Assistant

## 2020-08-04 ENCOUNTER — Other Ambulatory Visit (HOSPITAL_COMMUNITY): Payer: Self-pay | Admitting: Physician Assistant

## 2020-08-18 ENCOUNTER — Ambulatory Visit (INDEPENDENT_AMBULATORY_CARE_PROVIDER_SITE_OTHER): Payer: BC Managed Care – PPO

## 2020-08-18 ENCOUNTER — Other Ambulatory Visit: Payer: Self-pay

## 2020-08-18 ENCOUNTER — Ambulatory Visit (HOSPITAL_COMMUNITY): Payer: BC Managed Care – PPO | Attending: Internal Medicine

## 2020-08-18 DIAGNOSIS — R55 Syncope and collapse: Secondary | ICD-10-CM

## 2020-08-18 DIAGNOSIS — R2991 Unspecified symptoms and signs involving the musculoskeletal system: Secondary | ICD-10-CM | POA: Diagnosis not present

## 2020-08-18 LAB — EXERCISE TOLERANCE TEST
Estimated workload: 10.1 METS
Exercise duration (min): 9 min
Exercise duration (sec): 0 s
MPHR: 186 {beats}/min
Peak HR: 160 {beats}/min
Percent HR: 86 %
RPE: 16
Rest HR: 80 {beats}/min

## 2020-08-18 LAB — ECHOCARDIOGRAM COMPLETE
Area-P 1/2: 2.8 cm2
S' Lateral: 2.7 cm

## 2020-08-25 ENCOUNTER — Telehealth: Payer: Self-pay

## 2020-08-25 NOTE — Telephone Encounter (Signed)
Left detailed message on VM.  Pt was questioning why she needed to FU with Dr. Gasper Sells if her testing came back normal.  I left a message letting her know that Dr. Gasper Sells wanted to evaluate her for possible POTS.  I advised her to call the office if she still wants to cancel appointment for 09/28/20.

## 2020-08-27 MED FILL — DULoxetine HCL 60 MG CPEP: 60 | 30 days supply | Qty: 60 | Fill #0

## 2020-08-27 MED FILL — QUETIAPINE FUMARATE 50 MG T: 50 | 30 days supply | Qty: 60 | Fill #0

## 2020-08-28 ENCOUNTER — Other Ambulatory Visit (HOSPITAL_COMMUNITY): Payer: Self-pay | Admitting: Physician Assistant

## 2020-08-29 ENCOUNTER — Other Ambulatory Visit (HOSPITAL_COMMUNITY): Payer: Self-pay

## 2020-08-29 MED FILL — Lamotrigine Tab 25 MG: ORAL | 30 days supply | Qty: 60 | Fill #0 | Status: CN

## 2020-08-30 ENCOUNTER — Other Ambulatory Visit (HOSPITAL_COMMUNITY): Payer: Self-pay

## 2020-08-31 ENCOUNTER — Other Ambulatory Visit (HOSPITAL_COMMUNITY): Payer: Self-pay

## 2020-09-02 ENCOUNTER — Other Ambulatory Visit (HOSPITAL_COMMUNITY): Payer: Self-pay

## 2020-09-02 MED ORDER — AMPICILLIN 500 MG PO CAPS
500.0000 mg | ORAL_CAPSULE | Freq: Two times a day (BID) | ORAL | 3 refills | Status: DC
  Filled 2020-09-02 (×2): qty 60, 30d supply, fill #0

## 2020-09-11 ENCOUNTER — Other Ambulatory Visit (HOSPITAL_COMMUNITY): Payer: Self-pay

## 2020-09-15 ENCOUNTER — Other Ambulatory Visit (HOSPITAL_COMMUNITY): Payer: Self-pay

## 2020-09-27 NOTE — Progress Notes (Deleted)
Cardiology Office Note:    Date:  09/27/2020   ID:  Heather Stewart, DOB 07/30/1985, MRN [id number]  PCP:  Ria Bush, [place]  Cardiologist:  No primary care provider on file.  Advanced Practice Provider:  No care team member to display Electrophysiologist:  None       AX:[id number] follow up.  History of Present Illness:    Heather Stewart is a 35 y.o. female with a hx of hypertriglyceridemia, connective tissue disorder NOS (ocular lens disorder, palate, flat feet, scoliosis, severe myopia wrist sign) with CP who presents for evaluation 07/13/20.  In interim of this visit, patient echocardiogram, stress test, and ZioPatch.  Patient notes that she is doing ***.  Since day prior/last visit notes *** changes.  Relevant interval testing or therapy include ***.  There are no*** interval hospital/ED visit.    No chest pain or pressure ***.  No SOB/DOE*** and no PND/Orthopnea***.  No weight gain or leg swelling***.  No palpitations or syncope ***.  Ambulatory blood pressure ***.    Past Medical History:  Diagnosis Date  . ADHD (attention deficit hyperactivity disorder), combined type    dx as child and then received through psychiatry - reviewed records from psych: no recent w/u.  was on vyvanse during nursing school  . Adjustment disorder   . Chronic back pain    thoracic and lumbar - s/p unrevealing NSG eval   . Connective tissue disease ([place])    ocular lens dislocation, flat feet, high palate, no cardiac involvement  . Esophageal reflux   . Eye problems 08/2011   subluxated lenses OU, rec test for homocyetinuria to r/o as cause (see scanned form)  . Focal nodular hyperplasia of liver 11/10/2018   By MRI at [facility] 07/2018 - see scanned report  . Hx of migraines   . Irritable bowel syndrome   . MDD (major depressive disorder), recurrent episode ([place])   . Screening examination for pulmonary tuberculosis   . Swelling, mass, or  lump in head and neck   . Uvulitis 10/02/2018    Past Surgical History:  Procedure Laterality Date  . ESOPHAGOGASTRODUODENOSCOPY  07/2018   gastric polyps biopsied, mild acute ?alcoholic gastritis (Dr Jabier Mutton in [place])  . FOOT SURGERY     Right-glass removed in OR  . [place], [place]  . GANGLION CYST EXCISION     Left wrist  . MYRINGOTOMY  multiple   had tissue from behind ears implanted into bilateral TMs to close (1990s)  . REMOVE AND REPLACE LENS Left 2018   2 tension rings placed, lens replaced Isaiah Blakes) [place] [facility]    Current Medications: No outpatient medications have been marked as taking for the 09/28/20 encounter (Appointment) with Werner Lean, MD.     Allergies:   Amoxicillin-pot clavulanate, Neosporin [neomycin-bacitracin zn-polymyx], Bupropion, Doxycycline, and Vyvanse [lisdexamfetamine dimesylate]   Social History   Socioeconomic History  . Marital status: Single    Spouse name: Not on file  . Number of children: 0  . Years of education: Not on file  . Highest education level: Not on file  Occupational History  . Occupation: [employer]    Employer: [employer]  Tobacco Use  . Smoking status: Never Smoker  . Smokeless tobacco: Never Used  Substance and Sexual Activity  . Alcohol use: Yes    Comment: occ wine  . Drug use: No  . Sexual activity:  Yes    Birth control/protection: None  Other Topics Concern  . Not on file  Social History Narrative   Single      No children      Caffeine: 1 coke occasionally      Works at [employer] alone, 1 dog   Social Determinants of Radio broadcast assistant Strain: Not on file  Food Insecurity: Not on file  Transportation Needs: Not on file  Physical Activity: Not on file  Stress: Not on file  Social Connections: Not on file     Family History: The patient's family history includes Alcohol abuse in her father; Breast  cancer in her maternal aunt and another family member; COPD in her paternal grandfather; Cancer in her brother; Coronary artery disease in an other family member; Heart disease in an other family member; Kidney disease in an other family member; Liver cancer in her brother; Migraines in her mother; Other in her father. History of coronary artery disease notable for grandfather, grandmother. History of heart failure notable for no members. No Marfan's Disease. No history of cardiomyopathies including hypertrophic cardiomyopathy, left ventricular non-compaction, or arrhythmogenic right ventricular cardiomyopathy. History of arrhythmia notable for father with atrial fibrillation. Denies family history of sudden cardiac death including drowning, car accidents, or unexplained deaths in the family. No history of bicuspid aortic valve or aortic aneurysm or dissection.   ROS:   Please see the history of present illness.     All other systems reviewed and are negative.  EKGs/Labs/Other Studies Reviewed:    The following studies were reviewed today:  EKG:   07/13/2020: SR rate 76 09/22/2014: SR 74  Cardiac Event Monitoring***: Date: 08/24/20 Results:  Patient had a minimum heart rate of 49 bpm, maximum heart rate of 175 bpm, and average heart rate of 87 bpm.  Predominant underlying rhythm was sinus rhythm.  One run of nonsustained ventricular tachycardia occurred lasting 4 beats at longest with a max rate of 162 bpm at fastest.  Two runs of supreventricular tachycardia occurred lasting 5 beats at longest with a max rate of 169 bpm at fastest.  Isolated PACs were rare (<1.0%), with rare couplets.  Isolated PVCs were rare (<1.0%), with rare bigeminy.  No evidence of complete heart block.  Triggered and diary events associated with sinus rhythm, sinus bradycardia,and sinus tachycardia.   No malignant arrhythmias.   ECG Stress Testing : Date: 08/18/20 Results:  Blood pressure  demonstrated a normal response to exercise.  There was no ST segment deviation noted during stress.  No T wave inversion was noted during stress.  Negative, adequate stress test.  Transthoracic Echocardiogram: Date: 12/13/2016 Results: Study Conclusions   - Left ventricle: The cavity size was normal. Systolic function was  vigorous. The estimated ejection fraction was in the range of 65%  to 70%. Wall motion was normal; there were no regional wall  motion abnormalities. Left ventricular diastolic function  parameters were normal.  - Aortic valve: Trileaflet; normal thickness leaflets.  - Aortic root: The aortic root was normal in size.  - Ascending aorta: The ascending aorta was normal in size.  - Right ventricle: Systolic function was normal.  - Right atrium: The atrium was normal in size.  - Tricuspid valve: There was trivial regurgitation.  - Pulmonary arteries: Systolic pressure was within the normal  range.  - Inferior vena cava: The vessel was normal in size.   Date: 08/18/20 Results: Normal Aorta  1. Left ventricular ejection fraction, by estimation, is 60 to 65%. Left  ventricular ejection fraction by 3D volume is 62 %. The left ventricle has  normal function. The left ventricle has no regional wall motion  abnormalities. Left ventricular diastolic  parameters were normal.  2. Right ventricular systolic function is normal. The right ventricular  size is normal.  3. The mitral valve is normal in structure. No evidence of mitral valve  regurgitation.  4. The aortic valve is normal in structure. Aortic valve regurgitation is  trivial.   CPET: Date: 10/10/2014 Results: Conclusion: The interpretation of this test is limited due to  submaximal exercise. Based on available data, the patient has an  excellent functional capacity when compared to matched sedentary  norms. There is no clear cardiopulmonary limitation to exercise.  The patient's symptoms may  be related to her body habitus. Given  the mildly elevated Ve?VC2 slope and family history of connective  tissue disease, if symptoms persist, consider repeat testing in  6-12 months.   Recent Labs: No results found for requested labs within last 8760 hours.  Recent Lipid Panel    Component Value Date/Time   CHOL 196 10/31/2018 1019   TRIG 278.0 (H) 10/31/2018 1019   HDL 34.70 (L) 10/31/2018 1019   CHOLHDL 6 10/31/2018 1019   VLDL 55.6 (H) 10/31/2018 1019   LDLCALC 99 02/25/2011 1450   LDLDIRECT 129.0 10/31/2018 1019   Risk Assessment/Calculations:     N/A  Physical Exam:    VS:  There were no vitals taken for this visit.    G[place]t Score: 7 (at 2/22 eval)  Wt Readings from Last 3 Encounters:  07/13/20 227 lb 6.4 oz (103.1 kg)  10/30/19 237 lb 3.2 oz (107.6 kg)  08/01/19 229 lb (103.9 kg)    GEN:  Well nourished, well developed in no acute distress HEENT: Normal NECK: No JVD; No carotid bruits LYMPHATICS: No lymphadenopathy CARDIAC: RRR, no murmurs, rubs, gallops RESPIRATORY:  Clear to auscultation without rales, wheezing or rhonchi  ABDOMEN: Soft, non-tender, non-distended MUSCULOSKELETAL:  No edema; deformities as above SKIN: Warm and dry NEUROLOGIC:  Alert and oriented x 3 PSYCHIATRIC:  Normal affect   ASSESSMENT:    No diagnosis found. PLAN:    In order of problems listed above:  Orthostatic Hypotension/Vasovagal syncope - asymptomatic/symptomatic*** - stress the importance of salt and water intake - on TCAs, alpha agonists, beta antagonists suitable for discontinuation *** - urinate sodium goal of 150 mEq (if symptomatic) - gave education on slow rise, Valsalva maneuver exacerbation, temperature change - discussed muscle contraction and leg crossing - discussed elevation to 30-45 degrees when sleeping - will refer to health and wellness center:  exercise deconditioning exacerbates this issue, optimally benefits from bike/row or swimming - offered  compression stockings and abdominal binders - caffeine for post prandial symptoms with hydration - droxidopa (100-> 600 mg TID) or midodrine (2.5 -> 10 mg TID) - discussed risks and benefits of Florinef inclusion (0.05 -> 0.2 daily) - plasma norepipenephrine < 220, consider atomexetine - heating abdominal pad for supine hypertension - [facility] Referral  Marfanoid Habitus - without history of dissection in the family or evidence of aortopathy - no Aortic dilation in 4 years; could get future echo (no guideline recommendation but 4 years could be reasonable)    Medication Adjustments/Labs and Tests Ordered: Current medicines are reviewed at length with the patient today.  Concerns regarding medicines are outlined above.  No orders of the defined types were  placed in this encounter.  No orders of the defined types were placed in this encounter.   There are no Patient Instructions on file for this visit.   Signed, Werner Lean, MD  09/27/2020 4:17 PM    [facility]

## 2020-09-28 ENCOUNTER — Ambulatory Visit: Payer: BC Managed Care – PPO | Admitting: Internal Medicine

## 2020-09-30 ENCOUNTER — Other Ambulatory Visit (HOSPITAL_COMMUNITY): Payer: Self-pay

## 2020-09-30 MED FILL — Quetiapine Fumarate Tab 50 MG: ORAL | 30 days supply | Qty: 60 | Fill #0 | Status: AC

## 2020-09-30 MED FILL — Duloxetine HCl Enteric Coated Pellets Cap 60 MG (Base Eq): ORAL | 30 days supply | Qty: 60 | Fill #0 | Status: AC

## 2020-10-01 ENCOUNTER — Other Ambulatory Visit (HOSPITAL_COMMUNITY): Payer: Self-pay

## 2020-10-05 ENCOUNTER — Other Ambulatory Visit (HOSPITAL_COMMUNITY): Payer: Self-pay

## 2020-10-30 ENCOUNTER — Other Ambulatory Visit (HOSPITAL_COMMUNITY): Payer: Self-pay

## 2020-10-30 MED ORDER — DULOXETINE HCL 60 MG PO CPEP
60.0000 mg | ORAL_CAPSULE | Freq: Two times a day (BID) | ORAL | 3 refills | Status: DC
  Filled 2020-10-30 – 2020-11-26 (×2): qty 60, 30d supply, fill #0
  Filled 2021-02-04: qty 60, 30d supply, fill #1
  Filled 2021-03-04: qty 60, 30d supply, fill #2

## 2020-10-30 MED ORDER — QUETIAPINE FUMARATE 50 MG PO TABS
50.0000 mg | ORAL_TABLET | Freq: Every evening | ORAL | 5 refills | Status: DC
  Filled 2020-10-30 – 2020-11-26 (×2): qty 60, 30d supply, fill #0
  Filled 2021-04-02: qty 60, 30d supply, fill #1
  Filled 2021-05-10: qty 60, 30d supply, fill #2
  Filled 2021-06-08: qty 60, 30d supply, fill #3
  Filled 2021-07-09: qty 60, 30d supply, fill #4

## 2020-10-30 MED ORDER — ARIPIPRAZOLE 5 MG PO TABS
5.0000 mg | ORAL_TABLET | Freq: Every day | ORAL | 3 refills | Status: DC
  Filled 2020-10-30: qty 30, 30d supply, fill #0
  Filled 2020-11-26: qty 30, 30d supply, fill #1

## 2020-11-17 ENCOUNTER — Telehealth: Payer: Self-pay | Admitting: Pulmonary Disease

## 2020-11-17 NOTE — Telephone Encounter (Signed)
Heather Stewart from Dr. Ron Parker office stated that they are requesting information be sent to their office in regards to her sleep study results as if it were a referral in order to get her established. Heather Stewart will fax over required documents and once it is received it will be placed in Heather Stewart box. Pls regard; [phone]

## 2020-11-17 NOTE — Telephone Encounter (Signed)
Received fax placed forms in Dr. Juanetta Gosling box up front. Pls regard; [phone]

## 2020-11-19 NOTE — Telephone Encounter (Addendum)
I have obtained fax from Dr. Kae Heller office that had been placed in Dr. Juanetta Gosling box.  Called Dr. Ron Parker' office and spoke with Novella Olive letting her know that we did receive paperwork and stated to her that [facility] will not be back in office until Mon. 6/27 and asked if it would be okay to wait until then to send all to them at one time and she stated that would be fine as pt is not scheduled to come see them until Wed. 6/29.   Placing all in Dr. Juanetta Gosling box for him to get when he returns back to the office Mon. 6/27. Also routing the encounter to him as an FYI.  All will need to be faxed to Dr. Kae Heller office once the forms are filled out and signed by Dr. Halford Chessman.

## 2020-11-24 ENCOUNTER — Telehealth: Payer: Self-pay | Admitting: Pulmonary Disease

## 2020-11-24 NOTE — Telephone Encounter (Signed)
Dr. Halford Chessman has his folder that he is working on. Mandi will you check on this please.   Thank you

## 2020-11-25 ENCOUNTER — Ambulatory Visit
Admission: RE | Admit: 2020-11-25 | Discharge: 2020-11-25 | Disposition: A | Discharge status: Home / Self Care | Payer: BC Managed Care – PPO | Source: Ambulatory Visit | Attending: Obstetrics & Gynecology | Admitting: Obstetrics & Gynecology

## 2020-11-25 ENCOUNTER — Other Ambulatory Visit: Payer: Self-pay

## 2020-11-25 ENCOUNTER — Other Ambulatory Visit (HOSPITAL_COMMUNITY): Payer: Self-pay

## 2020-11-25 ENCOUNTER — Other Ambulatory Visit: Payer: Self-pay | Admitting: Obstetrics & Gynecology

## 2020-11-25 DIAGNOSIS — Z1231 Encounter for screening mammogram for malignant neoplasm of breast: Secondary | ICD-10-CM

## 2020-11-25 MED ORDER — PANTOPRAZOLE SODIUM 40 MG PO TBEC
40.0000 mg | DELAYED_RELEASE_TABLET | Freq: Every day | ORAL | 2 refills | Status: DC
  Filled 2020-11-25: qty 90, 90d supply, fill #0
  Filled 2021-02-04: qty 90, 90d supply, fill #1
  Filled 2021-05-10: qty 90, 90d supply, fill #2

## 2020-11-25 NOTE — Telephone Encounter (Signed)
Documents signed by Dr. Halford Chessman and notes sent with signed documents to Dr. Kae Heller office.

## 2020-11-25 NOTE — Telephone Encounter (Signed)
Kimel checking on fax and notes. Patient scheduled at Dr. Ron Parker office this afternoon. [place] phone number is [phone].

## 2020-11-25 NOTE — Telephone Encounter (Signed)
Forms faxed, received message that it was sent successfully.  Nothing further needed.

## 2020-11-25 NOTE — Telephone Encounter (Signed)
Heather Stewart is working with [facility] today so routing this to her for her to provide update once this has been received back from [facility] and faxed for pt. Pt had her appt today 6/29 with Dr. Ron Parker.

## 2020-11-25 NOTE — Telephone Encounter (Signed)
Formed signed and given to [employer] to fax to Dr. Kae Heller office.

## 2020-11-26 ENCOUNTER — Other Ambulatory Visit (HOSPITAL_COMMUNITY): Payer: Self-pay

## 2020-12-01 ENCOUNTER — Other Ambulatory Visit (HOSPITAL_COMMUNITY): Payer: Self-pay

## 2020-12-01 MED ORDER — AMPHETAMINE-DEXTROAMPHET ER 20 MG PO CP24
20.0000 mg | ORAL_CAPSULE | Freq: Every morning | ORAL | 0 refills | Status: DC
  Filled 2020-12-01: qty 30, 30d supply, fill #0

## 2020-12-23 ENCOUNTER — Other Ambulatory Visit: Payer: Self-pay | Admitting: Family Medicine

## 2020-12-23 DIAGNOSIS — Z1159 Encounter for screening for other viral diseases: Secondary | ICD-10-CM

## 2020-12-23 DIAGNOSIS — M359 Systemic involvement of connective tissue, unspecified: Secondary | ICD-10-CM

## 2020-12-23 DIAGNOSIS — E781 Pure hyperglyceridemia: Secondary | ICD-10-CM

## 2020-12-24 ENCOUNTER — Other Ambulatory Visit: Payer: BC Managed Care – PPO

## 2020-12-25 ENCOUNTER — Other Ambulatory Visit (HOSPITAL_COMMUNITY): Payer: Self-pay

## 2020-12-25 MED ORDER — BENZTROPINE MESYLATE 1 MG PO TABS
ORAL_TABLET | ORAL | 3 refills | Status: DC
  Filled 2020-12-25 – 2020-12-29 (×2): qty 30, 30d supply, fill #0

## 2020-12-29 ENCOUNTER — Other Ambulatory Visit (HOSPITAL_COMMUNITY): Payer: Self-pay

## 2020-12-29 MED ORDER — WEGOVY 0.25 MG/0.5ML ~~LOC~~ SOAJ
0.2500 mg | SUBCUTANEOUS | 0 refills | Status: DC
  Filled 2021-06-08 – 2021-06-14 (×3): qty 2, 28d supply, fill #0

## 2021-01-01 ENCOUNTER — Encounter: Payer: BC Managed Care – PPO | Admitting: Family Medicine

## 2021-01-06 ENCOUNTER — Other Ambulatory Visit (HOSPITAL_COMMUNITY): Payer: Self-pay

## 2021-01-07 ENCOUNTER — Other Ambulatory Visit (HOSPITAL_COMMUNITY): Payer: Self-pay

## 2021-01-07 MED ORDER — SAXENDA 18 MG/3ML ~~LOC~~ SOPN
PEN_INJECTOR | SUBCUTANEOUS | 0 refills | Status: DC
  Filled 2021-01-07 – 2021-01-11 (×2): qty 15, 30d supply, fill #0

## 2021-01-07 MED ORDER — NITROFURANTOIN MONOHYD MACRO 100 MG PO CAPS
100.0000 mg | ORAL_CAPSULE | Freq: Two times a day (BID) | ORAL | 0 refills | Status: AC
  Filled 2021-01-07: qty 14, 7d supply, fill #0

## 2021-01-07 MED ORDER — METRONIDAZOLE 0.75 % VA GEL
1.0000 | Freq: Every day | VAGINAL | 0 refills | Status: AC
  Filled 2021-01-07: qty 70, 7d supply, fill #0

## 2021-01-11 ENCOUNTER — Other Ambulatory Visit (HOSPITAL_COMMUNITY): Payer: Self-pay

## 2021-01-11 MED ORDER — UNIFINE PENTIPS 32G X 6 MM MISC
0 refills | Status: DC
  Filled 2021-01-11: qty 100, 90d supply, fill #0

## 2021-01-12 ENCOUNTER — Other Ambulatory Visit (HOSPITAL_COMMUNITY): Payer: Self-pay

## 2021-01-14 ENCOUNTER — Other Ambulatory Visit (HOSPITAL_COMMUNITY): Payer: Self-pay

## 2021-01-14 ENCOUNTER — Other Ambulatory Visit (HOSPITAL_BASED_OUTPATIENT_CLINIC_OR_DEPARTMENT_OTHER): Payer: Self-pay

## 2021-01-15 ENCOUNTER — Other Ambulatory Visit (HOSPITAL_COMMUNITY): Payer: Self-pay

## 2021-01-15 MED ORDER — AMPHETAMINE-DEXTROAMPHET ER 20 MG PO CP24
20.0000 mg | ORAL_CAPSULE | Freq: Every day | ORAL | 0 refills | Status: DC
  Filled 2021-01-15: qty 30, 30d supply, fill #0

## 2021-01-20 DIAGNOSIS — G4733 Obstructive sleep apnea (adult) (pediatric): Secondary | ICD-10-CM | POA: Diagnosis not present

## 2021-01-28 ENCOUNTER — Other Ambulatory Visit (HOSPITAL_COMMUNITY): Payer: Self-pay

## 2021-02-04 ENCOUNTER — Other Ambulatory Visit (HOSPITAL_COMMUNITY): Payer: Self-pay

## 2021-02-04 MED FILL — Quetiapine Fumarate Tab 50 MG: ORAL | 30 days supply | Qty: 60 | Fill #0 | Status: AC

## 2021-02-06 ENCOUNTER — Other Ambulatory Visit (HOSPITAL_COMMUNITY): Payer: Self-pay

## 2021-02-09 ENCOUNTER — Other Ambulatory Visit (HOSPITAL_COMMUNITY): Payer: Self-pay

## 2021-02-18 ENCOUNTER — Encounter: Payer: Self-pay | Admitting: Gastroenterology

## 2021-02-19 ENCOUNTER — Other Ambulatory Visit (HOSPITAL_COMMUNITY): Payer: Self-pay

## 2021-02-19 DIAGNOSIS — F900 Attention-deficit hyperactivity disorder, predominantly inattentive type: Secondary | ICD-10-CM | POA: Diagnosis not present

## 2021-02-19 DIAGNOSIS — F411 Generalized anxiety disorder: Secondary | ICD-10-CM | POA: Diagnosis not present

## 2021-02-19 DIAGNOSIS — F3181 Bipolar II disorder: Secondary | ICD-10-CM | POA: Diagnosis not present

## 2021-02-19 MED ORDER — QUETIAPINE FUMARATE 50 MG PO TABS
50.0000 mg | ORAL_TABLET | Freq: Every evening | ORAL | 1 refills | Status: DC
  Filled 2021-02-19 – 2021-03-19 (×2): qty 60, 60d supply, fill #0

## 2021-02-19 MED ORDER — AMPHETAMINE-DEXTROAMPHET ER 20 MG PO CP24
20.0000 mg | ORAL_CAPSULE | Freq: Every morning | ORAL | 0 refills | Status: DC
  Filled 2021-02-19 – 2021-03-04 (×2): qty 30, 30d supply, fill #0

## 2021-02-19 MED ORDER — DULOXETINE HCL 60 MG PO CPEP
60.0000 mg | ORAL_CAPSULE | Freq: Every day | ORAL | 1 refills | Status: DC
  Filled 2021-02-19: qty 90, 90d supply, fill #0

## 2021-02-19 MED ORDER — AMPHETAMINE-DEXTROAMPHET ER 20 MG PO CP24
20.0000 mg | ORAL_CAPSULE | Freq: Every morning | ORAL | 0 refills | Status: DC
  Filled 2021-05-10: qty 30, 30d supply, fill #0

## 2021-02-19 MED ORDER — AMPHETAMINE-DEXTROAMPHET ER 20 MG PO CP24
20.0000 mg | ORAL_CAPSULE | Freq: Every morning | ORAL | 0 refills | Status: DC
  Filled 2021-04-02: qty 30, 30d supply, fill #0

## 2021-03-01 ENCOUNTER — Other Ambulatory Visit (HOSPITAL_COMMUNITY): Payer: Self-pay

## 2021-03-02 ENCOUNTER — Emergency Department (HOSPITAL_BASED_OUTPATIENT_CLINIC_OR_DEPARTMENT_OTHER)
Admission: EM | Admit: 2021-03-02 | Discharge: 2021-03-02 | Disposition: A | Discharge status: Home / Self Care | Payer: 59 | Attending: Emergency Medicine | Admitting: Emergency Medicine

## 2021-03-02 ENCOUNTER — Emergency Department (HOSPITAL_BASED_OUTPATIENT_CLINIC_OR_DEPARTMENT_OTHER): Payer: 59

## 2021-03-02 ENCOUNTER — Encounter (HOSPITAL_BASED_OUTPATIENT_CLINIC_OR_DEPARTMENT_OTHER): Payer: Self-pay

## 2021-03-02 DIAGNOSIS — R1011 Right upper quadrant pain: Secondary | ICD-10-CM | POA: Diagnosis not present

## 2021-03-02 DIAGNOSIS — K8020 Calculus of gallbladder without cholecystitis without obstruction: Secondary | ICD-10-CM | POA: Insufficient documentation

## 2021-03-02 DIAGNOSIS — R9431 Abnormal electrocardiogram [ECG] [EKG]: Secondary | ICD-10-CM | POA: Diagnosis not present

## 2021-03-02 DIAGNOSIS — R1013 Epigastric pain: Secondary | ICD-10-CM | POA: Diagnosis present

## 2021-03-02 DIAGNOSIS — D72829 Elevated white blood cell count, unspecified: Secondary | ICD-10-CM | POA: Insufficient documentation

## 2021-03-02 DIAGNOSIS — R1010 Upper abdominal pain, unspecified: Secondary | ICD-10-CM | POA: Diagnosis not present

## 2021-03-02 LAB — COMPREHENSIVE METABOLIC PANEL
ALT: 43 U/L (ref 0–44)
AST: 80 U/L — ABNORMAL HIGH (ref 15–41)
Albumin: 4.3 g/dL (ref 3.5–5.0)
Alkaline Phosphatase: 66 U/L (ref 38–126)
Anion gap: 10 (ref 5–15)
BUN: 15 mg/dL (ref 6–20)
CO2: 24 mmol/L (ref 22–32)
Calcium: 8.9 mg/dL (ref 8.9–10.3)
Chloride: 106 mmol/L (ref 98–111)
Creatinine, Ser: 0.71 mg/dL (ref 0.44–1.00)
GFR, Estimated: >60 mL/min (ref >60)
Glucose, Bld: 86 mg/dL (ref 70–99)
Potassium: 3.6 mmol/L (ref 3.5–5.1)
Sodium: 140 mmol/L (ref 135–145)
Total Bilirubin: 0.4 mg/dL (ref 0.3–1.2)
Total Protein: 7.3 g/dL (ref 6.5–8.1)

## 2021-03-02 LAB — CBC WITH DIFFERENTIAL/PLATELET
Abs Immature Granulocytes: 0.07 10*3/uL (ref 0.00–0.07)
Basophils Absolute: 0.1 10*3/uL (ref 0.0–0.1)
Basophils Relative: 1 %
Eosinophils Absolute: 0.2 10*3/uL (ref 0.0–0.5)
Eosinophils Relative: 1 %
HCT: 43.9 % (ref 36.0–46.0)
Hemoglobin: 14.2 g/dL (ref 12.0–15.0)
Immature Granulocytes: 1 %
Lymphocytes Relative: 15 %
Lymphs Abs: 1.9 10*3/uL (ref 0.7–4.0)
MCH: 29.2 pg (ref 26.0–34.0)
MCHC: 32.3 g/dL (ref 30.0–36.0)
MCV: 90.3 fL (ref 80.0–100.0)
Monocytes Absolute: 0.5 10*3/uL (ref 0.1–1.0)
Monocytes Relative: 5 %
Neutro Abs: 9.5 10*3/uL — ABNORMAL HIGH (ref 1.7–7.7)
Neutrophils Relative %: 77 %
Platelets: 352 10*3/uL (ref 150–400)
RBC: 4.86 MIL/uL (ref 3.87–5.11)
RDW: 13.3 % (ref 11.5–15.5)
WBC: 12.1 10*3/uL — ABNORMAL HIGH (ref 4.0–10.5)
nRBC: 0 % (ref 0.0–0.2)

## 2021-03-02 LAB — URINALYSIS, ROUTINE W REFLEX MICROSCOPIC
Bilirubin Urine: NEGATIVE
Glucose, UA: NEGATIVE mg/dL
Hgb urine dipstick: NEGATIVE
Ketones, ur: NEGATIVE mg/dL
Leukocytes,Ua: NEGATIVE
Nitrite: NEGATIVE
Protein, ur: NEGATIVE mg/dL
Specific Gravity, Urine: 1.019 (ref 1.005–1.030)
pH: 7 (ref 5.0–8.0)

## 2021-03-02 LAB — LIPASE, BLOOD: Lipase: 59 U/L — ABNORMAL HIGH (ref 11–51)

## 2021-03-02 LAB — TROPONIN I (HIGH SENSITIVITY): Troponin I (High Sensitivity): <2 ng/L (ref <18)

## 2021-03-02 LAB — PREGNANCY, URINE: Preg Test, Ur: NEGATIVE

## 2021-03-02 MED ORDER — ONDANSETRON HCL 4 MG/2ML IJ SOLN
4.0000 mg | Freq: Once | INTRAMUSCULAR | Status: AC
Start: 2021-03-02 — End: 2021-03-02
  Administered 2021-03-02: 4 mg via INTRAVENOUS
  Filled 2021-03-02: qty 2

## 2021-03-02 NOTE — ED Provider Notes (Signed)
[facility] EMERGENCY DEPT Provider Note   CSN: [id number] Arrival date & time: 03/02/21  1825     History Chief Complaint  Patient presents with   Abdominal Pain    Heather Stewart is a 35 y.o. female who presents to the ED today with complaint of intermittent epigastric/right upper quadrant pain for the past 2 to 3 months.  She had an episode today that lasted longer than usual prompting ED visit.  She states that her symptoms seem to be getting more consistent.  She has an appoint with GI scheduled for later this month however has not been evaluated for same.  She typically takes Maalox for pain and it will typically resolve on its own however today it did not.  She also complains of 2 episodes of emesis today.  She denies fevers.  She does report she ate a [place] cheese steak prior to her pain beginning. The pain does intermittently radiate into her chest at times.   The history is provided by the patient and medical records.      Past Medical History:  Diagnosis Date   ADHD (attention deficit hyperactivity disorder), combined type    dx as child and then received through psychiatry - reviewed records from psych: no recent w/u.  was on vyvanse during nursing school   Adjustment disorder    Chronic back pain    thoracic and lumbar - s/p unrevealing NSG eval    Connective tissue disease ([place])    ocular lens dislocation, flat feet, high palate, no cardiac involvement   Esophageal reflux    Eye problems 08/2011   subluxated lenses OU, rec test for homocyetinuria to r/o as cause (see scanned form)   Focal nodular hyperplasia of liver 11/10/2018   By MRI at [facility] 07/2018 - see scanned report   Hx of migraines    Irritable bowel syndrome    MDD (major depressive disorder), recurrent episode ([place])    Screening examination for pulmonary tuberculosis    Swelling, mass, or lump in head and neck    Uvulitis 10/02/2018    Patient Active Problem List    Diagnosis Date Noted   Syncope and collapse 07/13/2020   Marfanoid habitus 07/13/2020   Redness of left eye 08/01/2019   Breast cancer screening, high risk patient 01/29/2019   Focal nodular hyperplasia of liver 11/10/2018   Adult acne 11/01/2018   Allergic rhinitis 11/01/2018   Hypertriglyceridemia 49/70/2637   Monoallelic mutation of PALB2 gene 05/14/2018   Generalized hypermobility of joints 05/06/2016   Pedal edema 07/10/2014   Left serous otitis media 07/31/2012   Large breasts 02/13/2012   Myopia with astigmatism 09/13/2011   Subluxation of both lenses 09/13/2011   Chest pain 09/06/2011   Connective tissue disorder ([place]) 03/08/2011   Healthcare maintenance 03/08/2011   GERD 07/08/2010   ABDOMINAL PAIN, EPIGASTRIC 07/08/2010   MDD (major depressive disorder), recurrent episode, moderate ([place]) 05/12/2010   IRRITABLE BOWEL SYNDROME 05/08/2009   DIZZINESS 05/08/2009   SWELLING MASS OR LUMP IN HEAD AND NECK 09/05/2008   MIGRAINES, HX OF 10/03/2007   ADHD 10/03/2007    Past Surgical History:  Procedure Laterality Date   ESOPHAGOGASTRODUODENOSCOPY  07/2018   gastric polyps biopsied, mild acute ?alcoholic gastritis (Dr Jabier Mutton in [place])   [place] removed in [place]     Left wrist   MYRINGOTOMY  multiple  had tissue from behind ears implanted into bilateral TMs to close (1990s)   Brainard Left 2018   2 tension rings placed, lens replaced Isaiah Blakes) [place] [facility]     OB History   No obstetric history on file.     Family History  Problem Relation Age of Onset   Alcohol abuse Father    Other Father        Spinal problem   Migraines Mother    Coronary artery disease Other        GF 5v bypass   COPD Paternal Grandfather    Breast cancer Maternal Aunt        x2   Cancer Brother        HALF brother (had kidney removed at 24 months-then died of cancer)   Breast cancer  Other        Aunts x3   Heart disease Other        MGF, PGF, GGM   Kidney disease Other        GGM   Liver cancer Brother     Social History   Tobacco Use   Smoking status: Never   Smokeless tobacco: Never  Substance Use Topics   Alcohol use: Yes    Comment: occ wine   Drug use: No    Home Medications Prior to Admission medications   Medication Sig Start Date End Date Taking? Authorizing Provider  amphetamine-dextroamphetamine (ADDERALL XR) 20 MG 24 hr capsule Take 1 capsule (20 mg total) by mouth in the morning. 04/16/21     amphetamine-dextroamphetamine (ADDERALL XR) 20 MG 24 hr capsule Take 1 capsule (20 mg total) by mouth in the morning. 03/19/21     amphetamine-dextroamphetamine (ADDERALL XR) 20 MG 24 hr capsule Take 1 capsule (20 mg total) by mouth in the morning. 02/19/21     ampicillin (PRINCIPEN) 500 MG capsule Take 1 capsule by mouth twice a day 09/02/20     ARIPiprazole (ABILIFY) 5 MG tablet Take 1 tablet (5 mg total) by mouth daily. 10/30/20     benztropine (COGENTIN) 1 MG tablet Take 1 tablet by mouth every night at bedtime as needed 12/25/20     DULoxetine (CYMBALTA) 60 MG capsule Take 60 mg by mouth 2 (two) times daily.    [provider]  DULoxetine (CYMBALTA) 60 MG capsule TAKE 1 CAPSULE BY MOUTH 2 TIMES DAILY 08/28/20 08/28/21  Laverle Hobby, PA-C  DULoxetine (CYMBALTA) 60 MG capsule Take 1 capsule (60 mg total) by mouth 2 (two) times daily. 10/30/20     DULoxetine (CYMBALTA) 60 MG capsule Take 1 capsule (60 mg total) by mouth daily. 02/19/21     DULoxetine (CYMBALTA) 60 MG capsule TAKE 1 CAPSULE BY MOUTH 2 TIMES DAILY 08/04/20 08/04/21  Laverle Hobby, PA-C  Insulin Pen Needle (UNIFINE PENTIPS) 32G X 6 MM MISC Use as directed with Saxenda 01/07/21     lamoTRIgine (LAMICTAL) 25 MG tablet TAKE 2 TABLETS BY MOUTH ONCE A DAY 08/28/20 08/28/21  Laverle Hobby, PA-C  lamoTRIgine (LAMICTAL) 25 MG tablet TAKE 1 TABLET BY MOUTH DAILY FOR 14 DAYS, THEN TAKE 2 TABLETS BY MOUTH  DAILY 07/30/20 07/30/21  Laverle Hobby, PA-C  Liraglutide -Weight Management (SAXENDA) 18 MG/3ML SOPN Inject 0.6 mg into the skin for 1 week then increase dose by 0.6 mg/day a week to target 3 mg daily. Max: $RemoveBef'3mg'aEmQvosuAU$ /day. 01/07/21     lisdexamfetamine (VYVANSE) 40 MG capsule TAKE 1 CAPSULE BY MOUTH ONCE  A DAY 08/28/20 02/24/21  Laverle Hobby, PA-C  loratadine (CLARITIN) 10 MG tablet Take 10 mg by mouth daily. In the morning    [provider]  Multiple Vitamins-Minerals (MULTIVITAMIN GUMMIES ADULT PO) Take 1 tablet by mouth daily at 12 noon.    [provider]  ondansetron (ZOFRAN) 4 MG tablet Take 1 tablet (4 mg total) by mouth as needed. 11/01/18   Ria Bush, MD  pantoprazole (PROTONIX) 40 MG tablet Take 1 tablet (40 mg total) by mouth daily. 11/01/18   Ria Bush, MD  pantoprazole (PROTONIX) 40 MG tablet Take 1 tablet (40 mg total) by mouth daily. 11/25/20     pantoprazole (PROTONIX) 40 MG tablet TAKE 1 TABLET BY MOUTH ONCE DAILY 07/03/20 07/03/21  Patriciaann Clan E, PA-C  QUEtiapine (SEROQUEL) 50 MG tablet Take 1 tablet by mouth at bedtime.    [provider]  QUEtiapine (SEROQUEL) 50 MG tablet TAKE 1 TO 2 TABLETS BY MOUTH AT BEDTIME 08/28/20 08/28/21  Patriciaann Clan E, PA-C  QUEtiapine (SEROQUEL) 50 MG tablet Take 1-2 tablets (50-100 mg total) by mouth at bedtime. 10/30/20     QUEtiapine (SEROQUEL) 50 MG tablet Take 1 tablet (50 mg total) by mouth at bedtime. 02/19/21     QUEtiapine (SEROQUEL) 50 MG tablet TAKE 1-2 TABLETS BY MOUTH DAILY AT BEDTIME 07/10/20 07/10/21  Laverle Hobby, PA-C  Semaglutide-Weight Management (WEGOVY) 0.25 MG/0.5ML SOAJ Inject 0.25 mg into the skin once a week. 12/29/20     spironolactone (ALDACTONE) 50 MG tablet Take 50 mg by mouth 2 (two) times daily. 07/01/20   [provider]    Allergies    Amoxicillin-pot clavulanate, Neosporin [neomycin-bacitracin zn-polymyx], Bupropion, Doxycycline, and Vyvanse [lisdexamfetamine dimesylate]  Review of  Systems   Review of Systems  Constitutional:  Negative for chills and fever.  Respiratory:  Negative for shortness of breath.   Cardiovascular:  Positive for chest pain.  Gastrointestinal:  Positive for abdominal pain, nausea and vomiting. Negative for constipation and diarrhea.  All other systems reviewed and are negative.  Physical Exam Updated Vital Signs BP 106/60   Pulse 68   Temp 98.3 F (36.8 C) (Oral)   Resp (!) 22   Ht $R'5\' 9"'Tf$  (1.753 m)   Wt 106.6 kg   LMP 02/07/2021   SpO2 99%   BMI 34.70 kg/m   Physical Exam Vitals and nursing note reviewed.  Constitutional:      Appearance: She is not ill-appearing or diaphoretic.  HENT:     Head: Normocephalic and atraumatic.  Eyes:     Conjunctiva/sclera: Conjunctivae normal.  Cardiovascular:     Rate and Rhythm: Normal rate and regular rhythm.     Pulses: Normal pulses.  Pulmonary:     Effort: Pulmonary effort is normal.     Breath sounds: Normal breath sounds. No wheezing, rhonchi or rales.  Abdominal:     Palpations: Abdomen is soft.     Tenderness: There is abdominal tenderness. There is no right CVA tenderness, left CVA tenderness, guarding or rebound.     Comments: Soft, + epigastric and RUQ TTP, +BS throughout, no r/g/r, neg mcburney's, no CVA TTP  Musculoskeletal:     Cervical back: Neck supple.  Skin:    General: Skin is warm and dry.  Neurological:     Mental Status: She is alert.    ED Results / Procedures / Treatments   Labs (all labs ordered are listed, but only abnormal results are displayed) Labs Reviewed  COMPREHENSIVE  METABOLIC PANEL - Abnormal; Notable for the following components:      Result Value   AST 80 (*)    All other components within normal limits  LIPASE, BLOOD - Abnormal; Notable for the following components:   Lipase 59 (*)    All other components within normal limits  CBC WITH DIFFERENTIAL/PLATELET - Abnormal; Notable for the following components:   WBC 12.1 (*)    Neutro Abs 9.5  (*)    All other components within normal limits  URINALYSIS, ROUTINE W REFLEX MICROSCOPIC  PREGNANCY, URINE  TROPONIN I (HIGH SENSITIVITY)    EKG None  Radiology US Abdomen Limited RUQ (LIVER/GB)  Result Date: 03/02/2021 CLINICAL DATA:  Right upper quadrant pain. EXAM: ULTRASOUND ABDOMEN LIMITED RIGHT UPPER QUADRANT COMPARISON:  Abdominal ultrasound 09/15/2009. FINDINGS: Gallbladder: Gallstones are present measuring up to 1 cm. Gallbladder wall is mildly thickened measuring 4 mm, although gallbladder appears under distended. There is no pericholecystic fluid. Sonographic Percell Miller sign is positive per sonographer. Common bile duct: Diameter: 5.5 mm. Liver: No focal lesion identified. Increased parenchymal echogenicity. Portal vein is patent on color Doppler imaging with normal direction of blood flow towards the liver. Other: None. IMPRESSION: 1. Cholelithiasis with mild gallbladder wall thickening and positive sonographic Murphy sign. No biliary ductal dilatation. Findings are suspicious for acute cholecystitis. Please correlate clinically. 2. Echogenic liver likely related to diffuse fatty infiltration. Electronically Signed   By: Ronney Asters M.D.   On: 03/02/2021 20:19    Procedures Procedures   Medications Ordered in ED Medications  ondansetron (ZOFRAN) injection 4 mg (4 mg Intravenous Given 03/02/21 2110)    ED Course  I have reviewed the triage vital signs and the nursing notes.  Pertinent labs & imaging results that were available during my care of the patient were reviewed by me and considered in my medical decision making (see chart for details).    MDM Rules/Calculators/A&P                           35 year old female presents to the ED today with complaint of epigastric and right upper quadrant abdominal pain, intermittent for the past few months, more persistent today.  On arrival to the ED vitals are stable patient appears to be in no acute distress.  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  She has plans to see GI later in this month however has not been evaluated for her intermittent pain.  On my exam she has epigastric and right upper quadrant tenderness palpation.  Concern for possible gallbladder etiology.  She does report that the pain will sometimes radiate into her chest and therefore will obtain an EKG and a troponin as well.  CBC with mild leukocytosis 12,100 without left shift. Hgb stable.  Troponin < 2 UPT negative U/A without signs of infection  Ultrasound: IMPRESSION:  1. Cholelithiasis with mild gallbladder wall thickening and positive  sonographic Murphy sign. No biliary ductal dilatation. Findings are  suspicious for acute cholecystitis. Please correlate clinically.  2. Echogenic liver likely related to diffuse fatty infiltration.   AST elevated at 80. Remainder of LFTs unremarkable Lipase 59  Discussed case with Dr. Georganna Skeans general surgery - recommends outpatient follow up at this time and returning to the ED for any worsening symptoms.   This note was prepared using Dragon voice recognition software and may include unintentional  dictation errors due to the inherent limitations of voice recognition software.   Final Clinical Impression(s) / ED Diagnoses Final diagnoses:  RUQ abdominal pain  Calculus of gallbladder without cholecystitis without obstruction    Rx / DC Orders ED Discharge Orders     None        Discharge Instructions      Please follow up with [facility] for further evaluation of your gallstones and to discuss elective removal of your gallbladder  Return to the ED IMMEDIATELY for any new/worsening symptoms including worsening pain lasting > 4 hours, fevers > 100.4, inability to tolerate fluids at home.        Eustaquio Maize, PA-C 03/02/21 2131    Deno Etienne, DO 03/02/21 2309

## 2021-03-02 NOTE — ED Triage Notes (Signed)
Patient arrives with abd pain that has been on and off since September. Patient reports the pain was really intense about 1630, but has since subsided.

## 2021-03-02 NOTE — Discharge Instructions (Addendum)
Please follow up with [facility] for further evaluation of your gallstones and to discuss elective removal of your gallbladder  Return to the ED IMMEDIATELY for any new/worsening symptoms including worsening pain lasting > 4 hours, fevers > 100.4, inability to tolerate fluids at home.

## 2021-03-04 ENCOUNTER — Other Ambulatory Visit: Payer: Self-pay | Admitting: General Surgery

## 2021-03-04 ENCOUNTER — Other Ambulatory Visit (HOSPITAL_COMMUNITY): Payer: Self-pay

## 2021-03-04 DIAGNOSIS — K8020 Calculus of gallbladder without cholecystitis without obstruction: Secondary | ICD-10-CM | POA: Diagnosis not present

## 2021-03-04 MED ORDER — HYDROCODONE-ACETAMINOPHEN 5-325 MG PO TABS
1.0000 | ORAL_TABLET | Freq: Four times a day (QID) | ORAL | 0 refills | Status: DC | PRN
  Filled 2021-03-04: qty 10, 5d supply, fill #0

## 2021-03-04 MED ORDER — AMOXICILLIN-POT CLAVULANATE 875-125 MG PO TABS
1.0000 | ORAL_TABLET | Freq: Two times a day (BID) | ORAL | 0 refills | Status: DC
  Filled 2021-03-04: qty 14, 7d supply, fill #0

## 2021-03-04 MED FILL — Quetiapine Fumarate Tab 50 MG: ORAL | 30 days supply | Qty: 60 | Fill #1 | Status: AC

## 2021-03-04 MED FILL — Pantoprazole Sodium EC Tab 40 MG (Base Equiv): ORAL | 90 days supply | Qty: 90 | Fill #0 | Status: CN

## 2021-03-09 NOTE — Patient Instructions (Signed)
DUE TO COVID-19 ONLY ONE VISITOR IS ALLOWED TO COME WITH YOU AND STAY IN THE WAITING ROOM ONLY DURING PRE OP AND PROCEDURE.   **NO VISITORS ARE ALLOWED IN THE SHORT STAY AREA OR RECOVERY ROOM!!**  IF YOU WILL BE ADMITTED INTO THE HOSPITAL YOU ARE ALLOWED ONLY TWO SUPPORT PEOPLE DURING VISITATION HOURS ONLY (10AM -8PM)   The support person(s) may change daily. The support person(s) must pass our screening, gel in and out, and wear a mask at all times, including in the patient's room. Patients must also wear a mask when staff or their support person are in the room.  No visitors under the age of 75. Any visitor under the age of 66 must be accompanied by an adult.        Your procedure is scheduled on: 03/16/21   Report to [facility] Main Entrance    Report to admitting at: 7:15 AM   Call this number if you have problems the morning of surgery [phone]   Do not eat food :After Midnight.   May have liquids until: 6:30 AM    day of surgery  CLEAR LIQUID DIET  Foods Allowed                                                                     Foods Excluded  Water, Black Coffee and tea, regular and decaf                             liquids that you cannot  Plain Jell-O in any flavor  (No red)                                           see through such as: Fruit ices (not with fruit pulp)                                     milk, soups, orange juice              Iced Popsicles (No red)                                    All solid food                                   Apple juices Sports drinks like Gatorade (No red) Lightly seasoned clear broth or consume(fat free) Sugar,   Sample Menu Breakfast                                Lunch                                     Supper Cranberry juice  Beef broth                            Chicken broth Jell-O                                     Grape juice                           Apple juice Coffee or tea                         Jell-O                                      Popsicle                                                Coffee or tea                        Coffee or tea      Complete one Ensure drink the morning of surgery at : 6:30 AM  the day of surgery.   The day of surgery:  Drink ONE (1) Pre-Surgery Clear Ensure or G2 by am the morning of surgery. Drink in one sitting. Do not sip.  This drink was given to you during your hospital  pre-op appointment visit. Nothing else to drink after completing the  Pre-Surgery Clear Ensure or G2.          If you have questions, please contact your surgeon's office.     Oral Hygiene is also important to reduce your risk of infection.                                    Remember - BRUSH YOUR TEETH THE MORNING OF SURGERY WITH YOUR REGULAR TOOTHPASTE   Do NOT smoke after Midnight   Take these medicines the morning of surgery with A SIP OF WATER: duloxetine,pantoprazole.  DO NOT TAKE ANY ORAL DIABETIC MEDICATIONS DAY OF YOUR SURGERY                              You may not have any metal on your body including hair pins, jewelry, and body piercing             Do not wear make-up, lotions, powders, perfumes/cologne, or deodorant  Do not wear nail polish including gel and S&S, artificial/acrylic nails, or any other type of covering on natural nails including finger and toenails. If you have artificial nails, gel coating, etc. that needs to be removed by a nail salon please have this removed prior to surgery or surgery may need to be canceled/ delayed if the surgeon/ anesthesia feels like they are unable to be safely monitored.   Do not shave  48 hours prior to surgery.    Do not bring valuables to the hospital. [facility] IS NOT  RESPONSIBLE   FOR VALUABLES.   Contacts, dentures or bridgework may not be worn into surgery.   Bring small overnight bag day of surgery.    Patients discharged on the day of surgery will not be allowed to  drive home.   Special Instructions: Bring a copy of your healthcare power of attorney and living will documents         the day of surgery if you haven't scanned them before.              Please read over the following fact sheets you were given: IF YOU HAVE QUESTIONS ABOUT YOUR PRE-OP INSTRUCTIONS PLEASE CALL [phone]   [facility] - Preparing for Surgery Before surgery, you can play an important role.  Because skin is not sterile, your skin needs to be as free of germs as possible.  You can reduce the number of germs on your skin by washing with CHG (chlorahexidine gluconate) soap before surgery.  CHG is an antiseptic cleaner which kills germs and bonds with the skin to continue killing germs even after washing. Please DO NOT use if you have an allergy to CHG or antibacterial soaps.  If your skin becomes reddened/irritated stop using the CHG and inform your nurse when you arrive at Short Stay. Do not shave (including legs and underarms) for at least 48 hours prior to the first CHG shower.  You may shave your face/neck. Please follow these instructions carefully:  1.  Shower with CHG Soap the night before surgery and the  morning of Surgery.  2.  If you choose to wash your hair, wash your hair first as usual with your  normal  shampoo.  3.  After you shampoo, rinse your hair and body thoroughly to remove the  shampoo.                           4.  Use CHG as you would any other liquid soap.  You can apply chg directly  to the skin and wash                       Gently with a scrungie or clean washcloth.  5.  Apply the CHG Soap to your body ONLY FROM THE NECK DOWN.   Do not use on face/ open                           Wound or open sores. Avoid contact with eyes, ears mouth and genitals (private parts).                       Wash face,  Genitals (private parts) with your normal soap.             6.  Wash thoroughly, paying special attention to the area where your surgery  will be performed.  7.   Thoroughly rinse your body with warm water from the neck down.  8.  DO NOT shower/wash with your normal soap after using and rinsing off  the CHG Soap.                9.  Pat yourself dry with a clean towel.            10.  Wear clean pajamas.            11.  Place clean sheets on your  bed the night of your first shower and do not  sleep with pets. Day of Surgery : Do not apply any lotions/deodorants the morning of surgery.  Please wear clean clothes to the hospital/surgery center.  FAILURE TO FOLLOW THESE INSTRUCTIONS MAY RESULT IN THE CANCELLATION OF YOUR SURGERY PATIENT SIGNATURE_________________________________  NURSE SIGNATURE__________________________________  ________________________________________________________________________

## 2021-03-10 ENCOUNTER — Encounter (HOSPITAL_COMMUNITY)
Admission: RE | Admit: 2021-03-10 | Discharge: 2021-03-10 | Disposition: A | Discharge status: Home / Self Care | Payer: 59 | Source: Ambulatory Visit | Attending: General Surgery | Admitting: General Surgery

## 2021-03-10 ENCOUNTER — Encounter (HOSPITAL_COMMUNITY): Payer: Self-pay

## 2021-03-10 ENCOUNTER — Other Ambulatory Visit: Payer: Self-pay

## 2021-03-10 DIAGNOSIS — Z01812 Encounter for preprocedural laboratory examination: Secondary | ICD-10-CM | POA: Diagnosis not present

## 2021-03-10 HISTORY — DX: Pneumonia, unspecified organism: J18.9

## 2021-03-10 HISTORY — DX: Anxiety disorder, unspecified: F41.9

## 2021-03-10 NOTE — Progress Notes (Signed)
Anesthesia Chart Review   Case: [id number] Date/Time: 03/16/21 0915   Procedures:      LAPAROSCOPIC CHOLECYSTECTOMY WITH ICG DYE     INDOCYANINE GREEN FLUORESCENCE IMAGING (ICG)   Anesthesia type: General   Pre-op diagnosis: CHOLELITHIASIS   Location: [facility] ROOM 04 / [facility]   Surgeons: Rolm Bookbinder, MD       DISCUSSION:35 y.o. never smoker with h/o connective tissue disorder NOS (ocular lens disorder, palate, flat feet, scoliosis, severe myopia wrist sign), cholelithiasis scheduled for above procedure 03/16/2021 with Dr. Rolm Bookbinder.   Anticipate pt can proceed with planned procedure barring acute status change.   VS: BP 122/81   Pulse 88   Temp 36.9 C (Oral)   Ht 5\' 9"  (1.753 m)   Wt 105.2 kg   LMP 03/07/2021   SpO2 100%   BMI 34.26 kg/m   PROVIDERS: Ria Bush, MD is PCP   Werner Lean, MD is Cardiologist  LABS: Labs reviewed: Acceptable for surgery. (all labs ordered are listed, but only abnormal results are displayed)  Labs Reviewed - No data to display   IMAGES:   EKG: 03/04/2021 Rate 75 bpm  Sinus rhythm  Probably left atrial enlargement  Borderline right axis deviation  CV: Exercise Tolerance Test 08/18/20 Blood pressure demonstrated a normal response to exercise. There was no ST segment deviation noted during stress. No T wave inversion was noted during stress. Negative, adequate stress test.   Echo 08/18/20  1. Left ventricular ejection fraction, by estimation, is 60 to 65%. Left  ventricular ejection fraction by 3D volume is 62 %. The left ventricle has  normal function. The left ventricle has no regional wall motion  abnormalities. Left ventricular diastolic   parameters were normal.   2. Right ventricular systolic function is normal. The right ventricular  size is normal.   3. The mitral valve is normal in structure. No evidence of mitral valve  regurgitation.   4. The aortic valve is normal in structure. Aortic valve  regurgitation is  trivial. Past Medical History:  Diagnosis Date   ADHD (attention deficit hyperactivity disorder), combined type    dx as child and then received through psychiatry - reviewed records from psych: no recent w/u.  was on vyvanse during nursing school   Adjustment disorder    Anxiety    Chronic back pain    thoracic and lumbar - s/p unrevealing NSG eval    Connective tissue disease ([place])    ocular lens dislocation, flat feet, high palate, no cardiac involvement   Esophageal reflux    Eye problems 08/2011   subluxated lenses OU, rec test for homocyetinuria to r/o as cause (see scanned form)   Focal nodular hyperplasia of liver 11/10/2018   By MRI at [facility] 07/2018 - see scanned report   Hx of migraines    Irritable bowel syndrome    MDD (major depressive disorder), recurrent episode ([place])    Pneumonia    Screening examination for pulmonary tuberculosis    Swelling, mass, or lump in head and neck    Uvulitis 10/02/2018    Past Surgical History:  Procedure Laterality Date   ESOPHAGOGASTRODUODENOSCOPY  07/2018   gastric polyps biopsied, mild acute ?alcoholic gastritis (Dr Jabier Mutton in [place])   [place] in [place], [place]     Left wrist   MYRINGOTOMY  multiple   had tissue from behind  ears implanted into bilateral TMs to close (1990s)   REMOVE AND REPLACE LENS Left 2018   2 tension rings placed, lens replaced Isaiah Blakes) [place] [facility]    MEDICATIONS:  amoxicillin-clavulanate (AUGMENTIN) 875-125 MG tablet   [START ON 04/16/2021] amphetamine-dextroamphetamine (ADDERALL XR) 20 MG 24 hr capsule   [START ON 03/19/2021] amphetamine-dextroamphetamine (ADDERALL XR) 20 MG 24 hr capsule   amphetamine-dextroamphetamine (ADDERALL XR) 20 MG 24 hr capsule   ampicillin (PRINCIPEN) 500 MG capsule   ARIPiprazole (ABILIFY) 5 MG tablet   benztropine (COGENTIN) 1 MG tablet   DULoxetine  (CYMBALTA) 60 MG capsule   DULoxetine (CYMBALTA) 60 MG capsule   DULoxetine (CYMBALTA) 60 MG capsule   DULoxetine (CYMBALTA) 60 MG capsule   HYDROcodone-acetaminophen (NORCO/VICODIN) 5-325 MG tablet   Insulin Pen Needle (UNIFINE PENTIPS) 32G X 6 MM MISC   lamoTRIgine (LAMICTAL) 25 MG tablet   lamoTRIgine (LAMICTAL) 25 MG tablet   Liraglutide -Weight Management (SAXENDA) 18 MG/3ML SOPN   lisdexamfetamine (VYVANSE) 40 MG capsule   ondansetron (ZOFRAN) 4 MG tablet   pantoprazole (PROTONIX) 40 MG tablet   pantoprazole (PROTONIX) 40 MG tablet   pantoprazole (PROTONIX) 40 MG tablet   QUEtiapine (SEROQUEL) 50 MG tablet   QUEtiapine (SEROQUEL) 50 MG tablet   QUEtiapine (SEROQUEL) 50 MG tablet   QUEtiapine (SEROQUEL) 50 MG tablet   Semaglutide-Weight Management (WEGOVY) 0.25 MG/0.5ML SOAJ   spironolactone (ALDACTONE) 50 MG tablet   No current facility-administered medications for this encounter.     Konrad Felix Ward, PA-C [facility] [phone]

## 2021-03-10 NOTE — Progress Notes (Signed)
COVID Vaccine Completed: Yes Date COVID Vaccine completed: 07/18/19 x 2 COVID vaccine manufacturer:Moderna    COVID Test: N/A  PCP - Dr. Ria Bush Cardiologist - Dr. Werner Lean. LOV: 07/13/20  Chest x-ray -  EKG -  Stress Test -  ECHO -  Cardiac Cath -  Pacemaker/ICD device last checked:  Sleep Study - Yes CPAP - NO  Fasting Blood Sugar -  Checks Blood Sugar _____ times a day  Blood Thinner Instructions: Aspirin Instructions: Last Dose:  Anesthesia review: Hx: syncope,palpitations.  Patient denies shortness of breath, fever, cough and chest pain at PAT appointment   Patient verbalized understanding of instructions that were given to them at the PAT appointment. Patient was also instructed that they will need to review over the PAT instructions again at home before surgery.

## 2021-03-12 ENCOUNTER — Encounter: Payer: Self-pay | Admitting: Family Medicine

## 2021-03-12 DIAGNOSIS — M359 Systemic involvement of connective tissue, unspecified: Secondary | ICD-10-CM

## 2021-03-16 ENCOUNTER — Encounter (HOSPITAL_COMMUNITY)
Admission: RE | Disposition: A | Discharge status: Home / Self Care | Payer: Self-pay | Source: Home / Self Care | Attending: General Surgery

## 2021-03-16 ENCOUNTER — Ambulatory Visit (HOSPITAL_COMMUNITY)
Admission: RE | Admit: 2021-03-16 | Discharge: 2021-03-16 | Disposition: A | Discharge status: Home / Self Care | Payer: 59 | Attending: General Surgery | Admitting: General Surgery

## 2021-03-16 ENCOUNTER — Encounter (HOSPITAL_COMMUNITY): Payer: Self-pay | Admitting: General Surgery

## 2021-03-16 ENCOUNTER — Other Ambulatory Visit: Payer: Self-pay

## 2021-03-16 ENCOUNTER — Ambulatory Visit (HOSPITAL_COMMUNITY): Payer: 59 | Admitting: Certified Registered Nurse Anesthetist

## 2021-03-16 ENCOUNTER — Other Ambulatory Visit: Payer: Self-pay | Admitting: General Surgery

## 2021-03-16 ENCOUNTER — Ambulatory Visit (HOSPITAL_COMMUNITY): Payer: 59 | Admitting: Physician Assistant

## 2021-03-16 ENCOUNTER — Other Ambulatory Visit (HOSPITAL_COMMUNITY): Payer: Self-pay

## 2021-03-16 DIAGNOSIS — K824 Cholesterolosis of gallbladder: Secondary | ICD-10-CM | POA: Diagnosis not present

## 2021-03-16 DIAGNOSIS — Z880 Allergy status to penicillin: Secondary | ICD-10-CM | POA: Insufficient documentation

## 2021-03-16 DIAGNOSIS — K219 Gastro-esophageal reflux disease without esophagitis: Secondary | ICD-10-CM | POA: Insufficient documentation

## 2021-03-16 DIAGNOSIS — Z793 Long term (current) use of hormonal contraceptives: Secondary | ICD-10-CM | POA: Insufficient documentation

## 2021-03-16 DIAGNOSIS — K589 Irritable bowel syndrome without diarrhea: Secondary | ICD-10-CM | POA: Diagnosis not present

## 2021-03-16 DIAGNOSIS — Z79899 Other long term (current) drug therapy: Secondary | ICD-10-CM | POA: Insufficient documentation

## 2021-03-16 DIAGNOSIS — K8010 Calculus of gallbladder with chronic cholecystitis without obstruction: Secondary | ICD-10-CM | POA: Diagnosis not present

## 2021-03-16 DIAGNOSIS — Z881 Allergy status to other antibiotic agents status: Secondary | ICD-10-CM | POA: Diagnosis not present

## 2021-03-16 DIAGNOSIS — J309 Allergic rhinitis, unspecified: Secondary | ICD-10-CM | POA: Diagnosis not present

## 2021-03-16 DIAGNOSIS — G8918 Other acute postprocedural pain: Secondary | ICD-10-CM | POA: Diagnosis not present

## 2021-03-16 HISTORY — PX: CHOLECYSTECTOMY: SHX55

## 2021-03-16 LAB — PREGNANCY, URINE: Preg Test, Ur: NEGATIVE

## 2021-03-16 SURGERY — LAPAROSCOPIC CHOLECYSTECTOMY
Anesthesia: General | Site: Abdomen

## 2021-03-16 MED ORDER — OXYCODONE HCL 5 MG PO TABS: 5.0000 mg | ORAL_TABLET | Freq: Once | ORAL | Status: AC | PRN

## 2021-03-16 MED ORDER — ROCURONIUM BROMIDE 10 MG/ML (PF) SYRINGE
PREFILLED_SYRINGE | INTRAVENOUS | Status: AC
  Filled 2021-03-16: qty 10

## 2021-03-16 MED ORDER — ONDANSETRON HCL 4 MG/2ML IJ SOLN
INTRAMUSCULAR | Status: AC
  Filled 2021-03-16: qty 2

## 2021-03-16 MED ORDER — SCOPOLAMINE 1 MG/3DAYS TD PT72
MEDICATED_PATCH | TRANSDERMAL | Status: AC
  Filled 2021-03-16: qty 1

## 2021-03-16 MED ORDER — FENTANYL CITRATE PF 50 MCG/ML IJ SOSY
50.0000 ug | PREFILLED_SYRINGE | INTRAMUSCULAR | Status: DC
  Administered 2021-03-16: 100 ug via INTRAVENOUS

## 2021-03-16 MED ORDER — CHLORHEXIDINE GLUCONATE CLOTH 2 % EX PADS: 6.0000 | MEDICATED_PAD | Freq: Once | CUTANEOUS | Status: DC

## 2021-03-16 MED ORDER — FENTANYL CITRATE (PF) 100 MCG/2ML IJ SOLN
INTRAMUSCULAR | Status: DC | PRN
  Administered 2021-03-16: 50 ug via INTRAVENOUS
  Administered 2021-03-16: 100 ug via INTRAVENOUS
  Administered 2021-03-16: 50 ug via INTRAVENOUS

## 2021-03-16 MED ORDER — DIPHENHYDRAMINE HCL 50 MG/ML IJ SOLN
INTRAMUSCULAR | Status: DC | PRN
  Administered 2021-03-16: 12.5 mg via INTRAVENOUS

## 2021-03-16 MED ORDER — FENTANYL CITRATE PF 50 MCG/ML IJ SOSY
PREFILLED_SYRINGE | INTRAMUSCULAR | Status: AC
  Filled 2021-03-16: qty 2

## 2021-03-16 MED ORDER — DEXAMETHASONE SODIUM PHOSPHATE 10 MG/ML IJ SOLN
INTRAMUSCULAR | Status: DC | PRN
Start: 2021-03-16 — End: 2021-03-16
  Administered 2021-03-16: 10 mg via INTRAVENOUS

## 2021-03-16 MED ORDER — LIDOCAINE HCL (PF) 2 % IJ SOLN
INTRAMUSCULAR | Status: AC
  Filled 2021-03-16: qty 5

## 2021-03-16 MED ORDER — MIDAZOLAM HCL 5 MG/5ML IJ SOLN
INTRAMUSCULAR | Status: DC | PRN
  Administered 2021-03-16: 2 mg via INTRAVENOUS

## 2021-03-16 MED ORDER — RINGERS IRRIGATION IR SOLN
Status: DC | PRN
  Administered 2021-03-16: 1

## 2021-03-16 MED ORDER — PROPOFOL 10 MG/ML IV BOLUS
INTRAVENOUS | Status: DC | PRN
  Administered 2021-03-16: 200 mg via INTRAVENOUS

## 2021-03-16 MED ORDER — PROPOFOL 10 MG/ML IV BOLUS
INTRAVENOUS | Status: AC
  Filled 2021-03-16: qty 20

## 2021-03-16 MED ORDER — INDOCYANINE GREEN 25 MG IV SOLR
1.0000 mg | Freq: Once | INTRAVENOUS | Status: AC
  Administered 2021-03-16: 1 mg via INTRAVENOUS
  Filled 2021-03-16: qty 0.4

## 2021-03-16 MED ORDER — ONDANSETRON HCL 4 MG/2ML IJ SOLN
INTRAMUSCULAR | Status: AC
  Administered 2021-03-16: 4 mg via INTRAVENOUS
  Filled 2021-03-16: qty 2

## 2021-03-16 MED ORDER — OXYCODONE HCL 5 MG PO TABS
ORAL_TABLET | ORAL | Status: AC
  Administered 2021-03-16: 5 mg via ORAL
  Filled 2021-03-16: qty 1

## 2021-03-16 MED ORDER — KETOROLAC TROMETHAMINE 30 MG/ML IJ SOLN
INTRAMUSCULAR | Status: AC
  Filled 2021-03-16: qty 1

## 2021-03-16 MED ORDER — ONDANSETRON HCL 4 MG/2ML IJ SOLN: 4.0000 mg | Freq: Once | INTRAMUSCULAR | Status: AC | PRN

## 2021-03-16 MED ORDER — OXYCODONE HCL 5 MG/5ML PO SOLN: 5.0000 mg | Freq: Once | ORAL | Status: AC | PRN

## 2021-03-16 MED ORDER — OXYCODONE HCL 5 MG PO TABS
5.0000 mg | ORAL_TABLET | Freq: Four times a day (QID) | ORAL | 0 refills | Status: DC | PRN
  Filled 2021-03-16: qty 10, 2d supply, fill #0

## 2021-03-16 MED ORDER — MIDAZOLAM HCL 2 MG/2ML IJ SOLN
INTRAMUSCULAR | Status: AC
  Filled 2021-03-16: qty 2

## 2021-03-16 MED ORDER — HYDROMORPHONE HCL 1 MG/ML IJ SOLN: 0.2500 mg | INTRAMUSCULAR | Status: DC | PRN

## 2021-03-16 MED ORDER — LACTATED RINGERS IV SOLN: INTRAVENOUS | Status: DC

## 2021-03-16 MED ORDER — SCOPOLAMINE 1 MG/3DAYS TD PT72
MEDICATED_PATCH | TRANSDERMAL | Status: DC | PRN
  Administered 2021-03-16: 1 via TRANSDERMAL

## 2021-03-16 MED ORDER — HYDROMORPHONE HCL 1 MG/ML IJ SOLN
INTRAMUSCULAR | Status: AC
  Filled 2021-03-16: qty 1

## 2021-03-16 MED ORDER — FENTANYL CITRATE (PF) 100 MCG/2ML IJ SOLN
INTRAMUSCULAR | Status: AC
  Filled 2021-03-16: qty 2

## 2021-03-16 MED ORDER — LIDOCAINE HCL (CARDIAC) PF 100 MG/5ML IV SOSY
PREFILLED_SYRINGE | INTRAVENOUS | Status: DC | PRN
  Administered 2021-03-16: 80 mg via INTRAVENOUS

## 2021-03-16 MED ORDER — ACETAMINOPHEN 500 MG PO TABS
1000.0000 mg | ORAL_TABLET | ORAL | Status: AC
  Administered 2021-03-16: 1000 mg via ORAL
  Filled 2021-03-16: qty 2

## 2021-03-16 MED ORDER — CHLORHEXIDINE GLUCONATE 0.12 % MT SOLN
15.0000 mL | Freq: Once | OROMUCOSAL | Status: AC
  Administered 2021-03-16: 15 mL via OROMUCOSAL

## 2021-03-16 MED ORDER — SUGAMMADEX SODIUM 200 MG/2ML IV SOLN
INTRAVENOUS | Status: DC | PRN
  Administered 2021-03-16: 400 mg via INTRAVENOUS

## 2021-03-16 MED ORDER — BUPIVACAINE-EPINEPHRINE 0.25% -1:200000 IJ SOLN
INTRAMUSCULAR | Status: DC | PRN
  Administered 2021-03-16: 20 mL

## 2021-03-16 MED ORDER — ENSURE PRE-SURGERY PO LIQD
296.0000 mL | Freq: Once | ORAL | Status: DC
  Filled 2021-03-16: qty 296

## 2021-03-16 MED ORDER — ONDANSETRON HCL 4 MG/2ML IJ SOLN
INTRAMUSCULAR | Status: DC | PRN
  Administered 2021-03-16: 4 mg via INTRAVENOUS

## 2021-03-16 MED ORDER — DEXAMETHASONE SODIUM PHOSPHATE 10 MG/ML IJ SOLN
INTRAMUSCULAR | Status: AC
  Filled 2021-03-16: qty 1

## 2021-03-16 MED ORDER — DIPHENHYDRAMINE HCL 50 MG/ML IJ SOLN
INTRAMUSCULAR | Status: AC
  Filled 2021-03-16: qty 1

## 2021-03-16 MED ORDER — BUPIVACAINE-EPINEPHRINE (PF) 0.25% -1:200000 IJ SOLN
INTRAMUSCULAR | Status: AC
  Filled 2021-03-16: qty 30

## 2021-03-16 MED ORDER — CEFAZOLIN SODIUM-DEXTROSE 2-4 GM/100ML-% IV SOLN
2.0000 g | INTRAVENOUS | Status: AC
  Administered 2021-03-16: 2 g via INTRAVENOUS
  Filled 2021-03-16: qty 100

## 2021-03-16 MED ORDER — BUPIVACAINE HCL (PF) 0.5 % IJ SOLN
INTRAMUSCULAR | Status: DC | PRN
  Administered 2021-03-16: 30 mL

## 2021-03-16 MED ORDER — 0.9 % SODIUM CHLORIDE (POUR BTL) OPTIME
TOPICAL | Status: DC | PRN
  Administered 2021-03-16: 1000 mL

## 2021-03-16 MED ORDER — ORAL CARE MOUTH RINSE: 15.0000 mL | Freq: Once | OROMUCOSAL | Status: AC

## 2021-03-16 MED ORDER — KETOROLAC TROMETHAMINE 30 MG/ML IJ SOLN: 30.0000 mg | Freq: Once | INTRAMUSCULAR | Status: DC | PRN

## 2021-03-16 MED ORDER — MIDAZOLAM HCL 2 MG/2ML IJ SOLN
1.0000 mg | INTRAMUSCULAR | Status: DC
  Administered 2021-03-16: 2 mg via INTRAVENOUS

## 2021-03-16 MED ORDER — KETOROLAC TROMETHAMINE 30 MG/ML IJ SOLN
INTRAMUSCULAR | Status: DC | PRN
  Administered 2021-03-16: 30 mg via INTRAVENOUS

## 2021-03-16 SURGICAL SUPPLY — 43 items
APPLIER CLIP 5 13 M/L LIGAMAX5 (MISCELLANEOUS) ×2
APPLIER CLIP ROT 10 11.4 M/L (STAPLE)
BAG COUNTER SPONGE SURGICOUNT (BAG) IMPLANT
BENZOIN TINCTURE PRP APPL 2/3 (GAUZE/BANDAGES/DRESSINGS) IMPLANT
CABLE HIGH FREQUENCY MONO STRZ (ELECTRODE) ×2 IMPLANT
CHLORAPREP W/TINT 26 (MISCELLANEOUS) ×2 IMPLANT
CLIP APPLIE 5 13 M/L LIGAMAX5 (MISCELLANEOUS) ×1 IMPLANT
CLIP APPLIE ROT 10 11.4 M/L (STAPLE) IMPLANT
COVER MAYO STAND STRL (DRAPES) IMPLANT
COVER SURGICAL LIGHT HANDLE (MISCELLANEOUS) ×2 IMPLANT
DECANTER SPIKE VIAL GLASS SM (MISCELLANEOUS) ×2 IMPLANT
DERMABOND ADVANCED (GAUZE/BANDAGES/DRESSINGS) ×1
DERMABOND ADVANCED .7 DNX12 (GAUZE/BANDAGES/DRESSINGS) ×1 IMPLANT
DEVICE TROCAR PUNCTURE CLOSURE (ENDOMECHANICALS) IMPLANT
DRAPE C-ARM 42X120 X-RAY (DRAPES) IMPLANT
ELECT REM PT RETURN 15FT ADLT (MISCELLANEOUS) ×2 IMPLANT
GLOVE SURG ENC MOIS LTX SZ7 (GLOVE) ×2 IMPLANT
GLOVE SURG UNDER POLY LF SZ7.5 (GLOVE) ×2 IMPLANT
GOWN STRL REUS W/TWL LRG LVL3 (GOWN DISPOSABLE) ×2 IMPLANT
GOWN STRL REUS W/TWL XL LVL3 (GOWN DISPOSABLE) ×4 IMPLANT
GRASPER SUT TROCAR 14GX15 (MISCELLANEOUS) ×2 IMPLANT
HEMOSTAT SNOW SURGICEL 2X4 (HEMOSTASIS) IMPLANT
IRRIG SUCT STRYKERFLOW 2 WTIP (MISCELLANEOUS) ×2
IRRIGATION SUCT STRKRFLW 2 WTP (MISCELLANEOUS) ×1 IMPLANT
KIT BASIN OR (CUSTOM PROCEDURE TRAY) ×2 IMPLANT
PENCIL SMOKE EVACUATOR (MISCELLANEOUS) IMPLANT
POUCH RETRIEVAL ECOSAC 10 (ENDOMECHANICALS) ×1 IMPLANT
POUCH RETRIEVAL ECOSAC 10MM (ENDOMECHANICALS) ×2
PROTECTOR NERVE ULNAR (MISCELLANEOUS) IMPLANT
SCISSORS LAP 5X35 DISP (ENDOMECHANICALS) ×2 IMPLANT
SET CHOLANGIOGRAPH MIX (MISCELLANEOUS) IMPLANT
SET TUBE SMOKE EVAC HIGH FLOW (TUBING) ×2 IMPLANT
SLEEVE XCEL OPT CAN 5 100 (ENDOMECHANICALS) ×4 IMPLANT
STRIP CLOSURE SKIN 1/2X4 (GAUZE/BANDAGES/DRESSINGS) IMPLANT
SUT MNCRL AB 4-0 PS2 18 (SUTURE) ×2 IMPLANT
SUT VICRYL 0 TIES 12 18 (SUTURE) IMPLANT
SUT VICRYL 0 UR6 27IN ABS (SUTURE) IMPLANT
TOWEL OR 17X26 10 PK STRL BLUE (TOWEL DISPOSABLE) ×2 IMPLANT
TOWEL OR NON WOVEN STRL DISP B (DISPOSABLE) ×2 IMPLANT
TRAY LAPAROSCOPIC (CUSTOM PROCEDURE TRAY) ×2 IMPLANT
TROCAR BLADELESS OPT 5 100 (ENDOMECHANICALS) ×2 IMPLANT
TROCAR XCEL BLUNT TIP 100MML (ENDOMECHANICALS) ×2 IMPLANT
TROCAR XCEL NON-BLD 11X100MML (ENDOMECHANICALS) IMPLANT

## 2021-03-16 NOTE — Anesthesia Postprocedure Evaluation (Signed)
Anesthesia Post Note  Patient: Heather Stewart  Procedure(s) Performed: LAPAROSCOPIC CHOLECYSTECTOMY WITH ICG DYE (Abdomen) INDOCYANINE GREEN FLUORESCENCE IMAGING (ICG) (Abdomen)     Patient location during evaluation: PACU Anesthesia Type: General Level of consciousness: awake and alert Pain management: pain level controlled Vital Signs Assessment: post-procedure vital signs reviewed and stable Respiratory status: spontaneous breathing, nonlabored ventilation, respiratory function stable and patient connected to nasal cannula oxygen Cardiovascular status: blood pressure returned to baseline and stable Postop Assessment: no apparent nausea or vomiting Anesthetic complications: no   No notable events documented.  Last Vitals:  Vitals:   03/16/21 1145 03/16/21 1212  BP: 96/73 113/70  Pulse: 62 69  Resp: 12 13  Temp:  36.4 C  SpO2: 97% 95%    Last Pain:  Vitals:   03/16/21 1212  TempSrc:   PainSc: 2                  Heather Stewart S

## 2021-03-16 NOTE — Anesthesia Preprocedure Evaluation (Signed)
Anesthesia Evaluation  Patient identified by MRN, date of birth, ID band Patient awake    Reviewed: Allergy & Precautions, NPO status , Patient's Chart, lab work & pertinent test results  Airway Mallampati: II  TM Distance: >3 FB Neck ROM: Full    Dental no notable dental hx.    Pulmonary neg pulmonary ROS,    Pulmonary exam normal breath sounds clear to auscultation       Cardiovascular negative cardio ROS Normal cardiovascular exam Rhythm:Regular Rate:Normal     Neuro/Psych Anxiety negative neurological ROS     GI/Hepatic Neg liver ROS, GERD  ,  Endo/Other  negative endocrine ROS  Renal/GU negative Renal ROS  negative genitourinary   Musculoskeletal negative musculoskeletal ROS (+)   Abdominal   Peds negative pediatric ROS (+)  Hematology negative hematology ROS (+)   Anesthesia Other Findings   Reproductive/Obstetrics negative OB ROS                             Anesthesia Physical Anesthesia Plan  ASA: 2  Anesthesia Plan: General   Post-op Pain Management:  Regional for Post-op pain   Induction: Intravenous  PONV Risk Score and Plan: 3 and Ondansetron, Dexamethasone, Midazolam and Treatment may vary due to age or medical condition  Airway Management Planned: Oral ETT  Additional Equipment:   Intra-op Plan:   Post-operative Plan: Extubation in OR  Informed Consent: I have reviewed the patients History and Physical, chart, labs and discussed the procedure including the risks, benefits and alternatives for the proposed anesthesia with the patient or authorized representative who has indicated his/her understanding and acceptance.     Dental advisory given  Plan Discussed with: CRNA and Surgeon  Anesthesia Plan Comments:         Anesthesia Quick Evaluation

## 2021-03-16 NOTE — Anesthesia Procedure Notes (Signed)
Anesthesia Procedure Image    

## 2021-03-16 NOTE — Anesthesia Procedure Notes (Signed)
Anesthesia Regional Block: TAP block   Pre-Anesthetic Checklist: , timeout performed,  Correct Patient, Correct Site, Correct Laterality,  Correct Procedure, Correct Position, site marked,  Risks and benefits discussed,  Surgical consent,  Pre-op evaluation,  At surgeon's request and post-op pain management  Laterality: Right  Prep: chloraprep       Needles:  Injection technique: Single-shot  Needle Type: Echogenic Needle     Needle Length: 9cm      Additional Needles:   Procedures:,,,, ultrasound used (permanent image in chart),,    Narrative:  Start time: 03/16/2021 8:55 AM End time: 03/16/2021 9:04 AM Injection made incrementally with aspirations every 5 mL.  Performed by: Personally  Anesthesiologist: Myrtie Soman, MD  Additional Notes: Patient tolerated the procedure well without complications

## 2021-03-16 NOTE — Op Note (Signed)
Preoperative diagnosis: cholecystitis Postoperative diagnosis: Chronic cholecystitis Procedure: Laparoscopic cholecystectomy Surgeon: Dr. Serita Grammes Asst: Dr Genella Mech Anesthesia: General with a tap block Estimated blood loss: Minimal Complications: None Drains: None Specimens: Gallbladder and contents to pathology Sponge needle count was correct at completion Disposition to recovery stable condition  Indications: 35yof with ruq pain, gallstones on [place] who I discussed laparoscopic cholecystectomy with.   Procedure: After informed consent was obtained the patient first underwent a bilateral tap block with anesthesia.  She was given antibiotics.  SCDs were in place.  She was then placed under general anesthesia without complication.  She was prepped and draped in the standard sterile surgical fashion.  A surgical timeout was then performed.  I infiltrated Marcaine below the umbilicus.  I made a vertical incision.  I grasped the fascia and incised it sharply.  I entered the peritoneum bluntly.  There was no evidence of an entry injury.  I then placed a 0 Vicryl pursestring suture through the fascia.  I then placed a Hassan trocar and insufflated the abdomen to 15 mmHg pressure.  I then placed 3 additional 5 mm trocars in epigastrium and right side of the abdomen under direct vision without complication.  Her gallbladder was noted to have chronic cholecystitis.   I retracted the gallbladder cephalad and lateral.  I did have her injected with ICG dye and used this to help identify the critical view of safety.  I was able to obtain the critical view of safety and left the gallbladder off the liver.  I then clipped the cystic duct 3 times.  I divided it leaving 2 clips in place.  The cystic duct was viable and theclips completely traversed the duct.  I treated the artery in a similar fashion with both the posterior and anterior branches being clipped separately. .  The gallbladder is then removed from  the liver bed.  I then placed the gallbladder in retrieval bag. I removed all of this from the umbilical trocar site.  Hemostasis was obtained.  I then removed the [facility] trocar.  I tied my pursestring down.  I placed 2 additional 0 Vicryl sutures using the suture passer device to completely obliterate this defect.  I then desufflated the abdomen and remove the remaining trochars.  These were closed with 4-0 Monocryl and glue.  She tolerated this well was extubated transferred to recovery stable.

## 2021-03-16 NOTE — Interval H&P Note (Signed)
History and Physical Interval Note:  03/16/2021 9:18 AM  Heather Stewart  has presented today for surgery, with the diagnosis of CHOLELITHIASIS.  The various methods of treatment have been discussed with the patient and family. After consideration of risks, benefits and other options for treatment, the patient has consented to  Procedure(s): LAPAROSCOPIC CHOLECYSTECTOMY WITH ICG DYE (N/A) INDOCYANINE GREEN FLUORESCENCE IMAGING (ICG) (N/A) as a surgical intervention.  The patient's history has been reviewed, patient examined, no change in status, stable for surgery.  I have reviewed the patient's chart and labs.  Questions were answered to the patient's satisfaction.     Heather Stewart

## 2021-03-16 NOTE — Transfer of Care (Signed)
Immediate Anesthesia Transfer of Care Note  Patient: Heather Stewart  Procedure(s) Performed: LAPAROSCOPIC CHOLECYSTECTOMY WITH ICG DYE (Abdomen) INDOCYANINE GREEN FLUORESCENCE IMAGING (ICG) (Abdomen)  Patient Location: PACU  Anesthesia Type:General  Level of Consciousness: sedated  Airway & Oxygen Therapy: Patient Spontanous Breathing and Patient connected to face mask oxygen  Post-op Assessment: Report given to RN and Post -op Vital signs reviewed and stable  Post vital signs: Reviewed and stable  Last Vitals:  Vitals Value Taken Time  BP 103/89 03/16/21 1042  Temp    Pulse 74 03/16/21 1044  Resp 12 03/16/21 1044  SpO2 93 % 03/16/21 1044  Vitals shown include unvalidated device data.  Last Pain:  Vitals:   03/16/21 0915  TempSrc:   PainSc: Asleep         Complications: No notable events documented.

## 2021-03-16 NOTE — Discharge Instructions (Signed)
[facility] LAPAROSCOPIC SURGERY: POST OP INSTRUCTIONS  Always review your discharge instruction sheet given to you by the facility where your surgery was performed. IF YOU HAVE DISABILITY OR FAMILY LEAVE FORMS, YOU MUST BRING THEM TO THE OFFICE FOR PROCESSING.   DO NOT GIVE THEM TO YOUR DOCTOR.  A prescription for pain medication may be given to you upon discharge.  Take your pain medication as prescribed, if needed.  If narcotic pain medicine is not needed, then you may take acetaminophen (Tylenol), naprosyn (Alleve), or ibuprofen (Advil) as needed. Take your usually prescribed medications unless otherwise directed. If you need a refill on your pain medication, please contact your pharmacy.  They will contact our office to request authorization. Prescriptions will not be filled after 5pm or on week-ends. You should follow a light diet the first few days after arrival home, such as soup and crackers, etc.  Be sure to include lots of fluids daily. Most patients will experience some swelling and bruising in the area of the incisions.  Ice packs will help.  Swelling and bruising can take several days to resolve.  It is common to experience some constipation if taking pain medication after surgery.  Increasing fluid intake and taking a stool softener (such as Colace) will usually help or prevent this problem from occurring.  A mild laxative (Milk of Magnesia or Miralax) should be taken according to package instructions if there are no bowel movements after 48 hours. Unless discharge instructions indicate otherwise, you may remove your bandages 48 hours after surgery, and you may shower at that time.  You may have steri-strips (small skin tapes) in place directly over the incision.  These strips should be left on the skin for 7-10 days.  If your surgeon used skin glue on the incision, you may shower in 24 hours.  The glue will flake off over the next  2-3 weeks.  Any sutures or staples will be removed at the office during your follow-up visit. ACTIVITIES:  You may resume regular (light) daily activities beginning the next day--such as daily self-care, walking, climbing stairs--gradually increasing activities as tolerated.  You may have sexual intercourse when it is comfortable.  Refrain from any heavy lifting or straining until approved by your doctor. You may drive when you are no longer taking prescription pain medication, you can comfortably wear a seatbelt, and you can safely maneuver your car and apply brakes. RETURN TO WORK:  __________________________________________________________ You should see your doctor in the office for a follow-up appointment approximately 2-3 weeks after your surgery.  Make sure that you call for this appointment within a day or two after you arrive home to insure a convenient appointment time. OTHER INSTRUCTIONS: __________________________________________________________________________________________________________________________ __________________________________________________________________________________________________________________________ WHEN TO CALL YOUR DOCTOR: Fever over 101.0 Inability to urinate Continued bleeding from incision. Increased pain, redness, or drainage from the incision. Increasing abdominal pain  The clinic staff is available to answer your questions during regular business hours.  Please don't hesitate to call and ask to speak to one of the nurses for clinical concerns.  If you have a medical emergency, go to the nearest emergency room or call 911.  A surgeon from [facility] is always on call at the hospital. [address], [address], [place], [place]  [address] ? [address] [address], [place], [place]   [address] [phone] ? [phone] ? FAX [phone] Web site: [email or web address]  

## 2021-03-16 NOTE — Anesthesia Procedure Notes (Signed)
Procedure Name: Intubation Date/Time: 03/16/2021 9:33 AM Performed by: [place] Ritchard Paragas C, CRNA Pre-anesthesia Checklist: Patient identified, Emergency Drugs available, Suction available and Patient being monitored Patient Re-evaluated:Patient Re-evaluated prior to induction Oxygen Delivery Method: Circle system utilized Preoxygenation: Pre-oxygenation with 100% oxygen Induction Type: IV induction Ventilation: Mask ventilation without difficulty Laryngoscope Size: Mac and 3 Grade View: Grade I Tube type: Oral Tube size: 7.0 mm Number of attempts: 1 Airway Equipment and Method: Stylet and Oral airway Placement Confirmation: ETT inserted through vocal cords under direct vision, positive ETCO2 and breath sounds checked- equal and bilateral Secured at: 21 cm Tube secured with: Tape Dental Injury: Teeth and Oropharynx as per pre-operative assessment

## 2021-03-16 NOTE — Progress Notes (Signed)
Assisted Dr. Kalman Shan with right, ultrasound guided, transabdominal plane block. Side rails up, monitors on throughout procedure. See vital signs in flow sheet. Tolerated Procedure well.

## 2021-03-16 NOTE — H&P (Signed)
35 yof with history of increasing in severity and more frequently ruq pain radiating to the back associated with nausea. These are associated with eating. She has [place] when she was in the ER this week with cholelithiasis with mild gbw thickening and pos sonographic Murphys sign. Her lfts are normal except for ast of 80. She is here today to discuss options  She has history of connective tissue disorder nos at this point. She works in the [employer].  Review of Systems: A complete review of systems was obtained from the patient. I have reviewed this information and discussed as appropriate with the patient. See HPI as well for other ROS.  Review of Systems  Gastrointestinal: Positive for abdominal pain.  All other systems reviewed and are negative.   Medical History: Past Medical History:  Diagnosis Date   ADHD   Connective tissue disease (CMS-HCC)   Depression   GERD (gastroesophageal reflux disease)   IBS (irritable bowel syndrome)   Migraines   PALB2-related breast cancer (CMS-HCC)   Wears glasses   Patient Active Problem List  Diagnosis   Generalized hypermobility of joints   Connective tissue disease (CMS-HCC)   Past Surgical History:  Procedure Laterality Date   EXTRACTION CATARACT EXTRACAPSULAR Left 08/25/2016  Procedure: Lensx------EXTRACTION CATARACT EXTRACAPSULAR; Surgeon: Timoteo Ace, MD; [facility]; Service: Ophthalmology; Laterality: Left; Dilation is particularly important for this procedure in this patient!   GANGLION CYST REMOVER Left  WRIST   MYRINGOTOMY    Allergies  Allergen Reactions   Amoxicillin-Pot Clavulanate Rash   Bacitracin Unknown   Bacitracin-Polymyxin B Rash   Doxycycline Unknown   Neomycin-Bacitracnzn-Polymyxnb Rash   Current Outpatient Medications on File Prior to Visit  Medication Sig Dispense Refill   pantoprazole (PROTONIX) 40 MG DR tablet Take 1 tablet by mouth once daily   QUEtiapine (SEROQUEL) 50 MG tablet Take 1-2  tablets by mouth at bedtime   ALPRAZolam (XANAX) 0.5 MG tablet TK 1 T PO TID PRN 1   BLISOVI 24 FE 1 mg-20 mcg (24)/75 mg (4) tablet Take 1 tablet by mouth once daily. 5   cetirizine (ZYRTEC) 10 MG tablet Take 10 mg by mouth once daily   DULoxetine (CYMBALTA) 60 MG DR capsule Take 60 mg by mouth once daily.   DULoxetine (CYMBALTA) 60 MG DR capsule daily   ketorolac (ACULAR) 0.5 % ophthalmic solution 1 drop three times a day in the surgical eye x 4 weeks. Start after surgery (Patient not taking: Reported on 09/27/2016 ) 5 mL 0   levonorgestrel-ethinyl estradiol (SEASONALE) 0.15 mg-30 mcg tablet Take 1 tablet by mouth once daily.   lisdexamfetamine (VYVANSE) 40 MG capsule Take 40 mg by mouth every morning   loratadine (CLARITIN) 10 mg tablet Take 10 mg by mouth once daily.   minocycline 90 mg ER tablet Take 90 mg by mouth once daily.  0   ofloxacin (OCUFLOX) 0.3 % ophthalmic solution 1 drop in the surgical eye three times a day for 1 week. Start after surgery (Patient not taking: Reported on 09/12/2016 ) 5 mL 0   pantoprazole (PROTONIX) 40 MG DR tablet TK 1 T PO QD 4   prednisoLONE acetate (PRED FORTE) 1 % ophthalmic suspension 1 drop in the surgical eye three times a day for 2 weeks. Start after surgery. (Patient not taking: Reported on 09/27/2016 ) 5 mL 0   QUEtiapine (SEROQUEL) 50 MG tablet Take 50 mg by mouth nightly   spironolactone (ALDACTONE) 25 MG tablet  spironolactone (ALDACTONE) 50 MG tablet TK 1 T PO BID FOR ACNE 0   No current facility-administered medications on file prior to visit.   Family History  Problem Relation Age of Onset   Coronary Artery Disease (Blocked arteries around heart) Mother   Alcohol abuse Father   Cancer Brother   Cancer Maternal Aunt   Heart disease Maternal Grandfather   COPD Paternal Grandfather   Heart disease Paternal Grandfather   Macular degeneration Maternal Grandmother   Anesthesia problems Neg Hx   Malignant hypertension Neg Hx   Glaucoma Neg  Hx    Social History   Tobacco Use  Smoking Status Never Smoker  Smokeless Tobacco Never Used    Social History   Socioeconomic History   Marital status: Single  Tobacco Use   Smoking status: Never Smoker   Smokeless tobacco: Never Used  Substance and Sexual Activity   Alcohol use: Yes  Alcohol/week: 2.0 standard drinks  Types: 2 Cans of beer per week   Drug use: No   Objective:   Vitals:  03/04/21 1535  BP: 134/82  Pulse: 100  Temp: 36.7 C (98.1 F)  SpO2: 98%  Weight: (!) 106.6 kg (235 lb)  Height: 175.3 cm ($RemoveB'5\' 9"'CPZNqrjP$ )   Body mass index is 34.7 kg/m.  Physical Exam Constitutional:  Appearance: Normal appearance.  Cardiovascular:  Rate and Rhythm: Normal rate.  Pulmonary:  Effort: Pulmonary effort is normal.  Abdominal:  General: There is no distension.  Palpations: Abdomen is soft.  Tenderness: There is abdominal tenderness (mild ruq).  Hernia: No hernia is present.  Neurological:  Mental Status: She is alert.     Assessment and Plan:   Symptomatic cholelithiasis  Other orders - amoxicillin-clavulanate (AUGMENTIN) 875-125 mg tablet; Take 1 tablet (875 mg total) by mouth every 12 (twelve) hours for 7 days - HYDROcodone-acetaminophen (NORCO) 5-325 mg tablet; Take 1 tablet by mouth every 6 (six) hours as needed for Pain for up to 5 days  I discussed the procedure in detail. We discussed the risks and benefits of a laparoscopic cholecystectomy and possible cholangiogram including, but not limited to bleeding, infection, injury to surrounding structures such as the intestine or liver, bile leak, retained gallstones, need to convert to an open procedure, prolonged diarrhea, blood clots such as DVT, common bile duct injury, anesthesia risks, and possible need for additional procedures. The likelihood of improvement in symptoms and return to the patient's normal status is good. We discussed the typical post-operative recovery course. She is going on a cruise soon  so I did give her augmentin as I think this is reasonable. I will schedule her for asap when she returns. Hopefully she will be ok during the cruise

## 2021-03-17 ENCOUNTER — Encounter (HOSPITAL_COMMUNITY): Payer: Self-pay | Admitting: General Surgery

## 2021-03-17 LAB — SURGICAL PATHOLOGY

## 2021-03-19 ENCOUNTER — Other Ambulatory Visit (HOSPITAL_COMMUNITY): Payer: Self-pay

## 2021-03-23 ENCOUNTER — Other Ambulatory Visit (HOSPITAL_COMMUNITY): Payer: Self-pay

## 2021-03-23 MED ORDER — TRIAMCINOLONE ACETONIDE 0.1 % EX OINT
TOPICAL_OINTMENT | Freq: Two times a day (BID) | CUTANEOUS | 0 refills | Status: DC
  Filled 2021-03-23: qty 30, 10d supply, fill #0

## 2021-03-26 NOTE — Addendum Note (Signed)
Addended by: Ria Bush on: 03/26/2021 07:27 AM   Modules accepted: Orders

## 2021-03-29 ENCOUNTER — Other Ambulatory Visit (HOSPITAL_COMMUNITY): Payer: Self-pay

## 2021-03-29 DIAGNOSIS — H52223 Regular astigmatism, bilateral: Secondary | ICD-10-CM | POA: Diagnosis not present

## 2021-03-30 NOTE — Telephone Encounter (Signed)
Referral information  Heather Stewart, [place] at [facility]  Address: [facility] [place], [place] [address]  Phone: [phone], opt 3 (Providers Opt 3, opt 1, opt 5)  Fax: [phone]  Called this location to see if able to help with this referral.   They do not see or treat patients with possible Elhers Danlos.  They are going to fax some literature on how to treat this in the PCP office.  They were not able to recommend any other locations.   Dr Danise Mina aware that this is being faxed to him. Cancelling this referral as we are unable to refer.   Dr Darnell Level / Lattie Haw - keep an eye out for the this fax coming today from [place] document.

## 2021-04-02 ENCOUNTER — Other Ambulatory Visit (HOSPITAL_COMMUNITY): Payer: Self-pay

## 2021-04-07 ENCOUNTER — Ambulatory Visit: Payer: BC Managed Care – PPO | Admitting: Gastroenterology

## 2021-05-10 ENCOUNTER — Other Ambulatory Visit (HOSPITAL_COMMUNITY): Payer: Self-pay

## 2021-05-10 MED ORDER — SPIRONOLACTONE 50 MG PO TABS
50.0000 mg | ORAL_TABLET | Freq: Two times a day (BID) | ORAL | 1 refills | Status: DC
  Filled 2021-05-10: qty 180, 90d supply, fill #0
  Filled 2021-08-03: qty 180, 90d supply, fill #1

## 2021-05-10 MED FILL — Duloxetine HCl Enteric Coated Pellets Cap 60 MG (Base Eq): ORAL | 30 days supply | Qty: 60 | Fill #1 | Status: AC

## 2021-06-03 ENCOUNTER — Other Ambulatory Visit (HOSPITAL_COMMUNITY): Payer: Self-pay

## 2021-06-03 DIAGNOSIS — N9089 Other specified noninflammatory disorders of vulva and perineum: Secondary | ICD-10-CM | POA: Diagnosis not present

## 2021-06-03 MED ORDER — CONDYLOX 0.5 % EX GEL
1.0000 "application " | CUTANEOUS | 1 refills | Status: DC
  Filled 2021-06-03: qty 7, 14d supply, fill #0
  Filled 2021-06-09: qty 7, 7d supply, fill #0

## 2021-06-04 ENCOUNTER — Other Ambulatory Visit (HOSPITAL_COMMUNITY): Payer: Self-pay

## 2021-06-04 MED ORDER — CONDYLOX 0.5 % EX GEL
CUTANEOUS | 1 refills | Status: DC
  Filled 2021-06-04: qty 7, 20d supply, fill #0

## 2021-06-08 ENCOUNTER — Other Ambulatory Visit (HOSPITAL_COMMUNITY): Payer: Self-pay

## 2021-06-09 ENCOUNTER — Other Ambulatory Visit (HOSPITAL_COMMUNITY): Payer: Self-pay

## 2021-06-10 ENCOUNTER — Other Ambulatory Visit (HOSPITAL_COMMUNITY): Payer: Self-pay

## 2021-06-10 MED ORDER — PODOFILOX 0.5 % EX SOLN
CUTANEOUS | 1 refills | Status: DC
  Filled 2021-06-10: qty 3.5, 7d supply, fill #0

## 2021-06-14 ENCOUNTER — Other Ambulatory Visit (HOSPITAL_COMMUNITY): Payer: Self-pay

## 2021-06-16 ENCOUNTER — Other Ambulatory Visit (HOSPITAL_COMMUNITY): Payer: Self-pay

## 2021-06-17 ENCOUNTER — Other Ambulatory Visit (HOSPITAL_COMMUNITY): Payer: Self-pay

## 2021-06-18 ENCOUNTER — Other Ambulatory Visit (HOSPITAL_COMMUNITY): Payer: Self-pay

## 2021-06-18 MED ORDER — AMPHETAMINE-DEXTROAMPHET ER 20 MG PO CP24
20.0000 mg | ORAL_CAPSULE | Freq: Every morning | ORAL | 0 refills | Status: DC
  Filled 2021-06-18: qty 30, 30d supply, fill #0

## 2021-06-24 ENCOUNTER — Other Ambulatory Visit (HOSPITAL_COMMUNITY): Payer: Self-pay

## 2021-06-25 ENCOUNTER — Other Ambulatory Visit (HOSPITAL_COMMUNITY): Payer: Self-pay

## 2021-06-25 DIAGNOSIS — R634 Abnormal weight loss: Secondary | ICD-10-CM | POA: Diagnosis not present

## 2021-06-25 DIAGNOSIS — N762 Acute vulvitis: Secondary | ICD-10-CM | POA: Diagnosis not present

## 2021-06-25 MED ORDER — WEGOVY 1 MG/0.5ML ~~LOC~~ SOAJ: 1.0000 mg | SUBCUTANEOUS | 0 refills | Status: DC

## 2021-06-25 MED ORDER — WEGOVY 1.7 MG/0.75ML ~~LOC~~ SOAJ: 1.7000 mg | SUBCUTANEOUS | 0 refills | Status: DC

## 2021-06-25 MED ORDER — WEGOVY 0.5 MG/0.5ML ~~LOC~~ SOAJ
0.5000 mg | SUBCUTANEOUS | 0 refills | Status: DC
  Filled 2021-06-25 – 2021-07-09 (×2): qty 2, 28d supply, fill #0

## 2021-07-05 ENCOUNTER — Other Ambulatory Visit (HOSPITAL_COMMUNITY): Payer: Self-pay

## 2021-07-09 ENCOUNTER — Other Ambulatory Visit (HOSPITAL_COMMUNITY): Payer: Self-pay

## 2021-07-12 ENCOUNTER — Other Ambulatory Visit (HOSPITAL_COMMUNITY): Payer: Self-pay

## 2021-07-13 ENCOUNTER — Other Ambulatory Visit (HOSPITAL_COMMUNITY): Payer: Self-pay

## 2021-07-15 ENCOUNTER — Other Ambulatory Visit (HOSPITAL_COMMUNITY): Payer: Self-pay

## 2021-07-20 DIAGNOSIS — N893 Dysplasia of vagina, unspecified: Secondary | ICD-10-CM | POA: Diagnosis not present

## 2021-07-20 DIAGNOSIS — N9089 Other specified noninflammatory disorders of vulva and perineum: Secondary | ICD-10-CM | POA: Diagnosis not present

## 2021-07-21 ENCOUNTER — Other Ambulatory Visit (HOSPITAL_COMMUNITY): Payer: Self-pay

## 2021-07-21 MED ORDER — AMPHETAMINE-DEXTROAMPHET ER 20 MG PO CP24
20.0000 mg | ORAL_CAPSULE | Freq: Every morning | ORAL | 0 refills | Status: DC
  Filled 2021-07-21: qty 30, 30d supply, fill #0

## 2021-07-22 ENCOUNTER — Other Ambulatory Visit (HOSPITAL_COMMUNITY): Payer: Self-pay

## 2021-07-23 ENCOUNTER — Other Ambulatory Visit (HOSPITAL_COMMUNITY): Payer: Self-pay

## 2021-07-23 MED ORDER — WEGOVY 1.7 MG/0.75ML ~~LOC~~ SOAJ
1.7000 mg | SUBCUTANEOUS | 0 refills | Status: DC
  Filled 2021-08-31: qty 3, 28d supply, fill #0

## 2021-07-23 MED ORDER — WEGOVY 1 MG/0.5ML ~~LOC~~ SOAJ
1.0000 mg | SUBCUTANEOUS | 0 refills | Status: DC
  Filled 2021-07-23 – 2021-08-03 (×2): qty 2, 28d supply, fill #0

## 2021-08-02 ENCOUNTER — Other Ambulatory Visit (HOSPITAL_COMMUNITY): Payer: Self-pay

## 2021-08-03 ENCOUNTER — Other Ambulatory Visit: Payer: Self-pay

## 2021-08-03 ENCOUNTER — Other Ambulatory Visit (HOSPITAL_COMMUNITY): Payer: Self-pay

## 2021-08-03 MED FILL — Duloxetine HCl Enteric Coated Pellets Cap 60 MG (Base Eq): ORAL | 30 days supply | Qty: 60 | Fill #2 | Status: AC

## 2021-08-04 ENCOUNTER — Other Ambulatory Visit (HOSPITAL_COMMUNITY): Payer: Self-pay

## 2021-08-04 MED ORDER — QUETIAPINE FUMARATE 50 MG PO TABS
50.0000 mg | ORAL_TABLET | Freq: Every day | ORAL | 1 refills | Status: DC
  Filled 2021-08-04: qty 60, 30d supply, fill #0

## 2021-08-06 ENCOUNTER — Other Ambulatory Visit (HOSPITAL_COMMUNITY): Payer: Self-pay

## 2021-08-06 MED ORDER — QUETIAPINE FUMARATE 50 MG PO TABS: 50.0000 mg | ORAL_TABLET | Freq: Every day | ORAL | 1 refills | Status: DC

## 2021-08-17 ENCOUNTER — Telehealth: Payer: Self-pay | Admitting: *Deleted

## 2021-08-17 NOTE — Telephone Encounter (Signed)
Spoke with the patient and scheduled a new patient appt with Dr Berline Lopes on 4/21 at 11:15 am; patient asked to arrival at 10:45 am. Patient given the address and phone number for the clinic; along with the policy for mask and visitors  ?

## 2021-08-23 ENCOUNTER — Other Ambulatory Visit (HOSPITAL_COMMUNITY): Payer: Self-pay

## 2021-08-23 DIAGNOSIS — F411 Generalized anxiety disorder: Secondary | ICD-10-CM | POA: Diagnosis not present

## 2021-08-23 DIAGNOSIS — F3181 Bipolar II disorder: Secondary | ICD-10-CM | POA: Diagnosis not present

## 2021-08-23 DIAGNOSIS — F900 Attention-deficit hyperactivity disorder, predominantly inattentive type: Secondary | ICD-10-CM | POA: Diagnosis not present

## 2021-08-23 MED ORDER — DULOXETINE HCL 60 MG PO CPEP
60.0000 mg | ORAL_CAPSULE | Freq: Every day | ORAL | 1 refills | Status: DC
  Filled 2021-08-23 – 2021-11-23 (×2): qty 90, 90d supply, fill #0
  Filled 2022-02-20: qty 90, 90d supply, fill #1

## 2021-08-23 MED ORDER — AMPHETAMINE-DEXTROAMPHET ER 20 MG PO CP24
20.0000 mg | ORAL_CAPSULE | Freq: Every morning | ORAL | 0 refills | Status: DC
  Filled 2021-10-28: qty 30, 30d supply, fill #0

## 2021-08-23 MED ORDER — AMPHETAMINE-DEXTROAMPHET ER 20 MG PO CP24
20.0000 mg | ORAL_CAPSULE | Freq: Every morning | ORAL | 0 refills | Status: DC
  Filled 2021-08-23 – 2021-08-31 (×2): qty 30, 30d supply, fill #0

## 2021-08-23 MED ORDER — QUETIAPINE FUMARATE 50 MG PO TABS
50.0000 mg | ORAL_TABLET | Freq: Every day | ORAL | 1 refills | Status: DC
  Filled 2021-08-23 – 2021-09-13 (×2): qty 180, 90d supply, fill #0
  Filled 2021-11-23: qty 180, 90d supply, fill #1

## 2021-08-23 MED ORDER — AMPHETAMINE-DEXTROAMPHET ER 20 MG PO CP24
20.0000 mg | ORAL_CAPSULE | Freq: Every morning | ORAL | 0 refills | Status: DC
  Filled 2021-12-21: qty 30, 30d supply, fill #0

## 2021-08-26 ENCOUNTER — Other Ambulatory Visit: Payer: Self-pay | Admitting: Obstetrics & Gynecology

## 2021-08-26 DIAGNOSIS — R898 Other abnormal findings in specimens from other organs, systems and tissues: Secondary | ICD-10-CM

## 2021-08-26 DIAGNOSIS — Z803 Family history of malignant neoplasm of breast: Secondary | ICD-10-CM

## 2021-08-30 ENCOUNTER — Other Ambulatory Visit (HOSPITAL_COMMUNITY): Payer: Self-pay

## 2021-08-30 MED ORDER — PANTOPRAZOLE SODIUM 40 MG PO TBEC
40.0000 mg | DELAYED_RELEASE_TABLET | Freq: Every day | ORAL | 2 refills | Status: DC
  Filled 2021-08-30 – 2021-08-31 (×2): qty 90, 90d supply, fill #0
  Filled 2021-11-23: qty 90, 90d supply, fill #1
  Filled 2022-02-20: qty 90, 90d supply, fill #2

## 2021-08-31 ENCOUNTER — Other Ambulatory Visit (HOSPITAL_COMMUNITY): Payer: Self-pay

## 2021-09-10 ENCOUNTER — Encounter: Payer: Self-pay | Admitting: Family Medicine

## 2021-09-13 ENCOUNTER — Other Ambulatory Visit (HOSPITAL_COMMUNITY): Payer: Self-pay

## 2021-09-14 ENCOUNTER — Other Ambulatory Visit (HOSPITAL_COMMUNITY): Payer: Self-pay

## 2021-09-17 ENCOUNTER — Encounter: Payer: Self-pay | Admitting: Gynecologic Oncology

## 2021-09-17 ENCOUNTER — Inpatient Hospital Stay: Payer: 59

## 2021-09-17 ENCOUNTER — Other Ambulatory Visit: Payer: Self-pay

## 2021-09-17 ENCOUNTER — Inpatient Hospital Stay: Payer: 59 | Attending: Gynecologic Oncology | Admitting: Gynecologic Oncology

## 2021-09-17 VITALS — BP 115/78 | HR 95 | Temp 98.2°F | Resp 16 | Ht 69.0 in | Wt 221.6 lb

## 2021-09-17 DIAGNOSIS — Z1589 Genetic susceptibility to other disease: Secondary | ICD-10-CM

## 2021-09-17 DIAGNOSIS — R8769 Abnormal cytological findings in specimens from other female genital organs: Secondary | ICD-10-CM

## 2021-09-17 DIAGNOSIS — N903 Dysplasia of vulva, unspecified: Secondary | ICD-10-CM | POA: Insufficient documentation

## 2021-09-17 DIAGNOSIS — Z148 Genetic carrier of other disease: Secondary | ICD-10-CM | POA: Insufficient documentation

## 2021-09-17 DIAGNOSIS — F909 Attention-deficit hyperactivity disorder, unspecified type: Secondary | ICD-10-CM | POA: Insufficient documentation

## 2021-09-17 DIAGNOSIS — K219 Gastro-esophageal reflux disease without esophagitis: Secondary | ICD-10-CM | POA: Diagnosis not present

## 2021-09-17 DIAGNOSIS — R2991 Unspecified symptoms and signs involving the musculoskeletal system: Secondary | ICD-10-CM

## 2021-09-17 DIAGNOSIS — F419 Anxiety disorder, unspecified: Secondary | ICD-10-CM | POA: Insufficient documentation

## 2021-09-17 DIAGNOSIS — Z1502 Genetic susceptibility to malignant neoplasm of ovary: Secondary | ICD-10-CM | POA: Diagnosis not present

## 2021-09-17 DIAGNOSIS — N939 Abnormal uterine and vaginal bleeding, unspecified: Secondary | ICD-10-CM

## 2021-09-17 DIAGNOSIS — Z79899 Other long term (current) drug therapy: Secondary | ICD-10-CM | POA: Insufficient documentation

## 2021-09-17 DIAGNOSIS — F339 Major depressive disorder, recurrent, unspecified: Secondary | ICD-10-CM | POA: Diagnosis not present

## 2021-09-17 DIAGNOSIS — Z1501 Genetic susceptibility to malignant neoplasm of breast: Secondary | ICD-10-CM | POA: Diagnosis not present

## 2021-09-17 DIAGNOSIS — Z1509 Genetic susceptibility to other malignant neoplasm: Secondary | ICD-10-CM | POA: Diagnosis not present

## 2021-09-17 NOTE — Progress Notes (Signed)
GYNECOLOGIC ONCOLOGY NEW PATIENT CONSULTATION  ? ?Patient Name: Heather Stewart  ?Patient Age: 36 y.o. ?Date of Service: 09/17/21 ?Referring Provider: Linda Hedges, DO ? ?Primary Care Provider: Ria Bush, MD ?Consulting Provider: Jeral Pinch, MD  ? ?Assessment/Plan:  ?36yo with low-grade vulvar dysplasia, PALB2 mutation.  ? ?We discussed her recent vulvar biopsies showing low grade vulvar dysplasia. The patient has overall been asymptomatic with regard to her low grade dysplasia. We reviewed that in the setting of low-grade VIN, recommendation is typically for close surveillance. Low grade vulvar dysplasia is often due to non-oncogenic or low-risk HPV types. Treatment can be considered in persistent low-grade dysplasia or symptomatic dysplasia and can include laser therapy, excision, and topical therapy (such as Imiquimod). At this time, I would suggest close follow-up for her VIN1 with visits q 6 months with her OBGYN. ? ?In terms of her PALB2 mutation, she met with one of the medical oncologists in 2020 about her genetic testing results with regard to increased risk of breast and ovarian cancer. She is undergoing more intense breast cancer screening at this time. We discussed the life time risk of ovarian cancer in patient's with a PALB2 mutation (3-5%) and the recommendation per NCCN guidelines to consider risk-reducing BSO after childbearing is completed and generally after the age of 35 yo (closer to natural menopause). We discussed that, unlike breast cancer, there is not a screening test for ovarian cancer. While we sometimes use pelvic ultrasounds and CA-125 to follow women at increased risk of ovarian cancer, neither is a good screening test.  At this time, the patient strongly desires future fertility. ? ?With regard to her menses, she continues to have regular menses but has had decreased length and quantity of bleeding over the last few years. She was previously on OCPs but is not currently  using birth control. I offered to get an AMH to get a sense of her ovarian reserve. We discussed that it will be important for her to see a specialist (an REI)_ is she wants to pursue options to achieve pregnancy.  ? ?The patient has some of the criteria for diagnosing Marfan's syndrome (versus another connective tissue disorder). She has previously been told she needs to get genetic testing but has not been able to for a variety of reasons. I offered to reach out to our genetic counselors at the cancer center to see if they know who to contact about such testing without the [facility] system.  ? ?Discussed that in the setting of low-grade vaginal intraepithelial neoplasia, recommendation is for close surveillance.  I recommend that she undergo clinical exam and cotesting yearly for 2 years.  I reviewed that these lesions often regress on their own and are frequently the result of not oncogenic subtypes of HPV. ? ?A copy of this note was sent to the patient's referring provider.  ? ?60 minutes of total time was spent for this patient encounter, including preparation, face-to-face counseling with the patient and coordination of care, and documentation of the encounter. ? ? ?Heather Pinch, MD  ?Division of Gynecologic Oncology  ?Department of Obstetrics and Gynecology  ?[facility]  ?___________________________________________  ?Chief Complaint: ?No chief complaint on file. ? ? ?History of Present Illness:  ?Heather Stewart is a 36 y.o. y.o. female who is seen in consultation at the request of Heather Pizana, DO for an evaluation of low-grade vulvar dysplasia. ? ?Patient was initially treated with TCA for what was presumed to be a genital  wart.  Represented in January with new vulvar lesion which was noted to be at 6:00 at the introitus with thickening of the skin, pallor, and fissuring.  Biopsy versus excision was offered, and patient wished to proceed with excision, which was performed on  3/7. ?Pathology showed squamous hyperplasia, parakeratosis and focal dysplasia with comment that dysplasia appears to be at least be VAIN 1.  No invasive malignancy noted. ? ?Today, the patient reports that she has frequent vaginal and vulvar pruritus.  She had no other symptoms but for a couple of months prior to her first visit for the vulvar lesion, she had felt something on the ridge of tissue between her vagina and anus.  She thought initially it was an ingrown hair.  She was first treated for dry skin and then received Condylox for what was thought to be genital wart.  Since biopsy was performed, she denies any new symptoms.  She cannot feel any firmness or ridge of tissue now.  She denies any bleeding, discharge, or pain. ? ?Patient reports overall normal bowel function although last week had 5 days of diarrhea and now has been constipated for the last few days.  She denies any urinary symptoms. ? ?Her menstrual history is notable for changes over the last couple years.  She has regular monthly menses but her period instead of lasting 3 days, now last a day or day and a half.  She describes it as much lighter than it had been and sometimes not even heavy enough for her to use a pad or a tampon.  She denies any associated pain or cramping.  She denies any intermenstrual bleeding.  She has never been pregnant and has never attempted pregnancy.  She would like to retain her fertility if possible. ? ?Given her family history, her mother underwent genetic testing and tested positive for a PAL B2 mutation.  The patient herself had genetic testing in 12/2017.  In the setting of her PAL B2 mutation, she has establish care with a medical oncologist here and undergoes more frequent breast cancer screening. ? ?PAST MEDICAL HISTORY:  ?Past Medical History:  ?Diagnosis Date  ? Acne   ? ADHD (attention deficit hyperactivity disorder), combined type   ? dx as child and then received through psychiatry - reviewed records from  psych: no recent w/u.  was on vyvanse during nursing school  ? Adjustment disorder   ? Anxiety   ? Chronic back pain   ? thoracic and lumbar - s/p unrevealing NSG eval   ? Connective tissue disease ([place])   ? ocular lens dislocation, flat feet, high palate, no cardiac involvement  ? Esophageal reflux   ? Eye problems 08/2011  ? subluxated lenses OU, rec test for homocyetinuria to r/o as cause (see scanned form)  ? Focal nodular hyperplasia of liver 11/10/2018  ? By MRI at [facility] 07/2018 - see scanned report  ? Hx of migraines   ? Irritable bowel syndrome   ? MDD (major depressive disorder), recurrent episode ([place])   ? Pneumonia   ? Screening examination for pulmonary tuberculosis   ? Swelling, mass, or lump in head and neck   ? Uvulitis 10/02/2018  ?  ? ?PAST SURGICAL HISTORY:  ?Past Surgical History:  ?Procedure Laterality Date  ? BREAST LUMPECTOMY Right 03/2018  ? CHOLECYSTECTOMY N/A 03/16/2021  ? Procedure: LAPAROSCOPIC CHOLECYSTECTOMY WITH ICG DYE;  Surgeon: Rolm Bookbinder, MD;  Location: [facility];  Service: General;  Laterality: N/A;  ?  ESOPHAGOGASTRODUODENOSCOPY  07/2018  ? gastric polyps biopsied, mild acute ?alcoholic gastritis (Dr Jabier Mutton in [place])  ? FOOT SURGERY    ? Right-glass removed in OR  ? [place], LINGUAL  2001  ? GANGLION CYST EXCISION    ? Left wrist  ? MYRINGOTOMY  multiple  ? had tissue from behind ears implanted into bilateral TMs to close (1990s)  ? [place] LENS Left 2018  ? 2 tension rings placed, lens replaced Isaiah Blakes) [place] [facility]  ? ? ?OB/GYN HISTORY:  ?OB History  ?Gravida Para Term Preterm AB Living  ?0 0 0 0 0 0  ?SAB IAB Ectopic Multiple Live Births  ?0 0 0 0 0  ? ? ?No LMP recorded. ? ?Age at menarche: 76 ?Age at menopause: Not applicable ?Hx of HRT: Not applicable ?Hx of STDs: HPV ?Last pap: 12/04/2020-negative, high risk HPV negative.  Cellular changes consistent with hyperkeratosis noted. ?History of abnormal pap smears: yes - LSIL pap  01/2018 ? ?SCREENING STUDIES:  ?Last mammogram: 2022, scheduled on 09/21/2021  ?Last colonoscopy: Not applicable ? ?MEDICATIONS: ?Outpatient Encounter Medications as of 09/17/2021  ?Medication Sig  ? amphetam

## 2021-09-17 NOTE — Patient Instructions (Addendum)
It was very nice to meet you today. ? ?I will release your blood test from today when back.  Depending on the results, I think this will help decide how quickly or not quickly it would be beneficial for you to talk to a reproductive endocrinologist and infertility doctor. ? ?I will also reach out to our genetic counselors to ask about if and how you could get genetic testing for Marfan syndrome or other connective tissue disorders. ? ?In terms of your PALB2 mutation, given her risk of ovarian cancer over your lifetime between 3-5%, our national guidelines recommend consideration of risk-reducing surgery after childbearing is complete and after the age of 20.  We do not have good screening testing for ovarian cancer.  We sometimes use pelvic ultrasounds and lab test (CA-125) to monitor patient's at higher risk of developing ovarian cancer, but neither of these are good screening test. ? ?With regard to your dysplasia, based on where the biopsies were taken, this is vulvar dysplasia.  Typically, we do not treat low-grade vulvar dysplasia but instead keep a close eye on it.  If you were to feel new areas or develop other symptoms, it would be important for you to call your OB/GYN to be sooner than your next scheduled visit.  Otherwise, I think you can continue with visits every 6 months as you have been doing.  If you were to develop symptoms, it would be reasonable to discuss topical treatment (creams) or laser therapy with your OB/GYN. ?

## 2021-09-19 DIAGNOSIS — R8769 Abnormal cytological findings in specimens from other female genital organs: Secondary | ICD-10-CM | POA: Insufficient documentation

## 2021-09-21 ENCOUNTER — Ambulatory Visit
Admission: RE | Admit: 2021-09-21 | Discharge: 2021-09-21 | Disposition: A | Discharge status: Home / Self Care | Payer: 59 | Source: Ambulatory Visit | Attending: Obstetrics & Gynecology | Admitting: Obstetrics & Gynecology

## 2021-09-21 DIAGNOSIS — Z853 Personal history of malignant neoplasm of breast: Secondary | ICD-10-CM | POA: Diagnosis not present

## 2021-09-21 DIAGNOSIS — N6011 Diffuse cystic mastopathy of right breast: Secondary | ICD-10-CM | POA: Diagnosis not present

## 2021-09-21 DIAGNOSIS — Z803 Family history of malignant neoplasm of breast: Secondary | ICD-10-CM

## 2021-09-21 DIAGNOSIS — N6012 Diffuse cystic mastopathy of left breast: Secondary | ICD-10-CM | POA: Diagnosis not present

## 2021-09-21 DIAGNOSIS — R898 Other abnormal findings in specimens from other organs, systems and tissues: Secondary | ICD-10-CM

## 2021-09-21 DIAGNOSIS — D241 Benign neoplasm of right breast: Secondary | ICD-10-CM | POA: Diagnosis not present

## 2021-09-21 MED ORDER — GADOBUTROL 1 MMOL/ML IV SOLN
10.0000 mL | Freq: Once | INTRAVENOUS | Status: AC | PRN
  Administered 2021-09-21: 10 mL via INTRAVENOUS

## 2021-09-23 ENCOUNTER — Other Ambulatory Visit: Payer: Self-pay | Admitting: Gynecologic Oncology

## 2021-09-23 DIAGNOSIS — M2480 Other specific joint derangements of unspecified joint, not elsewhere classified: Secondary | ICD-10-CM

## 2021-09-23 DIAGNOSIS — M359 Systemic involvement of connective tissue, unspecified: Secondary | ICD-10-CM

## 2021-09-23 DIAGNOSIS — R2991 Unspecified symptoms and signs involving the musculoskeletal system: Secondary | ICD-10-CM

## 2021-09-23 LAB — ANTI MULLERIAN HORMONE: ANTI-MULLERIAN HORMONE (AMH): 1.24 ng/mL

## 2021-09-27 ENCOUNTER — Ambulatory Visit (INDEPENDENT_AMBULATORY_CARE_PROVIDER_SITE_OTHER): Payer: 59 | Admitting: Family Medicine

## 2021-09-27 VITALS — BP 102/70 | HR 78 | Temp 97.9°F | Ht 69.0 in | Wt 219.1 lb

## 2021-09-27 DIAGNOSIS — Z0001 Encounter for general adult medical examination with abnormal findings: Secondary | ICD-10-CM

## 2021-09-27 DIAGNOSIS — K7689 Other specified diseases of liver: Secondary | ICD-10-CM | POA: Diagnosis not present

## 2021-09-27 DIAGNOSIS — R8769 Abnormal cytological findings in specimens from other female genital organs: Secondary | ICD-10-CM

## 2021-09-27 DIAGNOSIS — F909 Attention-deficit hyperactivity disorder, unspecified type: Secondary | ICD-10-CM | POA: Diagnosis not present

## 2021-09-27 DIAGNOSIS — E785 Hyperlipidemia, unspecified: Secondary | ICD-10-CM | POA: Diagnosis not present

## 2021-09-27 DIAGNOSIS — E781 Pure hyperglyceridemia: Secondary | ICD-10-CM

## 2021-09-27 DIAGNOSIS — Z1589 Genetic susceptibility to other disease: Secondary | ICD-10-CM

## 2021-09-27 DIAGNOSIS — F331 Major depressive disorder, recurrent, moderate: Secondary | ICD-10-CM

## 2021-09-27 DIAGNOSIS — Z1501 Genetic susceptibility to malignant neoplasm of breast: Secondary | ICD-10-CM

## 2021-09-27 DIAGNOSIS — M359 Systemic involvement of connective tissue, unspecified: Secondary | ICD-10-CM

## 2021-09-27 DIAGNOSIS — Z1159 Encounter for screening for other viral diseases: Secondary | ICD-10-CM

## 2021-09-27 DIAGNOSIS — E669 Obesity, unspecified: Secondary | ICD-10-CM

## 2021-09-27 DIAGNOSIS — E66811 Obesity, class 1: Secondary | ICD-10-CM

## 2021-09-27 DIAGNOSIS — K219 Gastro-esophageal reflux disease without esophagitis: Secondary | ICD-10-CM | POA: Diagnosis not present

## 2021-09-27 DIAGNOSIS — Z1509 Genetic susceptibility to other malignant neoplasm: Secondary | ICD-10-CM

## 2021-09-27 DIAGNOSIS — Z0000 Encounter for general adult medical examination without abnormal findings: Secondary | ICD-10-CM

## 2021-09-27 LAB — COMPREHENSIVE METABOLIC PANEL
ALT: 10 U/L (ref 0–35)
AST: 12 U/L (ref 0–37)
Albumin: 4.5 g/dL (ref 3.5–5.2)
Alkaline Phosphatase: 62 U/L (ref 39–117)
BUN: 13 mg/dL (ref 6–23)
CO2: 28 mEq/L (ref 19–32)
Calcium: 9.7 mg/dL (ref 8.4–10.5)
Chloride: 104 mEq/L (ref 96–112)
Creatinine, Ser: 0.77 mg/dL (ref 0.40–1.20)
GFR: 100.15 mL/min (ref >60.00)
Glucose, Bld: 79 mg/dL (ref 70–99)
Potassium: 4.5 mEq/L (ref 3.5–5.1)
Sodium: 139 mEq/L (ref 135–145)
Total Bilirubin: 0.4 mg/dL (ref 0.2–1.2)
Total Protein: 7.3 g/dL (ref 6.0–8.3)

## 2021-09-27 LAB — SEDIMENTATION RATE: Sed Rate: 20 mm/hr (ref 0–20)

## 2021-09-27 LAB — CBC WITH DIFFERENTIAL/PLATELET
Basophils Absolute: 0.1 10*3/uL (ref 0.0–0.1)
Basophils Relative: 0.9 % (ref 0.0–3.0)
Eosinophils Absolute: 0.1 10*3/uL (ref 0.0–0.7)
Eosinophils Relative: 1.1 % (ref 0.0–5.0)
HCT: 43.2 % (ref 36.0–46.0)
Hemoglobin: 14.8 g/dL (ref 12.0–15.0)
Lymphocytes Relative: 22.1 % (ref 12.0–46.0)
Lymphs Abs: 1.7 10*3/uL (ref 0.7–4.0)
MCHC: 34.1 g/dL (ref 30.0–36.0)
MCV: 88.3 fl (ref 78.0–100.0)
Monocytes Absolute: 0.3 10*3/uL (ref 0.1–1.0)
Monocytes Relative: 4.3 % (ref 3.0–12.0)
Neutro Abs: 5.4 10*3/uL (ref 1.4–7.7)
Neutrophils Relative %: 71.6 % (ref 43.0–77.0)
Platelets: 275 10*3/uL (ref 150.0–400.0)
RBC: 4.9 Mil/uL (ref 3.87–5.11)
RDW: 13.4 % (ref 11.5–15.5)
WBC: 7.5 10*3/uL (ref 4.0–10.5)

## 2021-09-27 LAB — TSH: TSH: 2.31 u[IU]/mL (ref 0.35–5.50)

## 2021-09-27 LAB — LIPID PANEL
Cholesterol: 202 mg/dL — ABNORMAL HIGH (ref 0–200)
HDL: 37 mg/dL — ABNORMAL LOW (ref >39.00)
LDL Cholesterol: 129 mg/dL — ABNORMAL HIGH (ref 0–99)
NonHDL: 165.12
Total CHOL/HDL Ratio: 5
Triglycerides: 179 mg/dL — ABNORMAL HIGH (ref 0.0–149.0)
VLDL: 35.8 mg/dL (ref 0.0–40.0)

## 2021-09-27 NOTE — Assessment & Plan Note (Signed)
Preventative protocols reviewed and updated unless pt declined. Discussed healthy diet and lifestyle.  

## 2021-09-27 NOTE — Progress Notes (Signed)
? ? Patient ID: Heather Stewart, female    DOB: [birth year], 36 y.o.   MRN: [id number] ? ?This visit was conducted in person. ? ?BP 102/70   Pulse 78   Temp 97.9 ?F (36.6 ?C) (Temporal)   Ht $R'5\' 9"'Hz$  (1.753 m)   Wt 219 lb 2 oz (99.4 kg)   LMP 09/06/2021 (Exact Date)   SpO2 100%   BMI 32.36 kg/m?   ? ?CC: CPE ?Subjective:  ? ?HPI: ?Heather Stewart is a 36 y.o. female presenting on 09/27/2021 for Annual Exam (Will req. Pap from OB/GYN) and Referral (Genetic testing ) ? ? 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  ? ?Sees GYN for low grade vulvar dysplasia.  ? ?Sees Dr Ronn [facility]ena [facility] ([place] in [place]) for depression/anxiety and ADHD.  ?Known focal nodular hyperplasia. EGD in [place] showed benign polyps and mild gastritis.  ? ?H/o CTD, sees ophtho and cardiology [facility] --> [facility]). Has seen pulm for excessive daytime sleepiness - home sleep study 2019 showed mild OSA. Has seen [place] rheum ([place]), referred to geneticist at [facility] - they declined referral as they don't see CTD. [facility] also doesn't see CTD. These symptoms are worsening - joint pains, poor wound healing, peripheral neuropathy.  ?Per initial rheum consult note 04/2016:  ?Hypermobility syndrome Beighton score 7/9 ?Ski hyperextensibility, joint dislocation, easy bruising, stretch marks, stress urinary incontinence ?High arch palate, micrognathia, span/height  ?Non documented ectopic lentis  ?No cardiologic finding (no aorta dilation, no MVP) ?Negative 1st degree family history  ?Her finding are concerning for connective tissue: Ehler Danlos>Marfan ?Will refer patient to [facility] genetics for further assessment  ? ?Planning to see neurosurgery for chronic back and neck pain.  ? ?S/p L eye surgery 2018 (lens replacement for dislocation and tension rings placed).  ? ?On wegovy through GYN.  ? ?Preventative: ?Well woman exam with OBGYN Dr [facility], sees  q6 mo. Last pap 2022.  ?Breast cancer screening - rec yearly mammo/breast MRI, started age 35yo due to [place] mutation. H/o fibroadenoma age 67yo s/p biopsy. 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  ?LMP - 09/06/2021  ?Flu shot yearly  ?Td 2006, Tdap 2014  ?COVID vaccine - Moderna 05/2019, 07/2019  ?MMR booster 03/2016  ?Seat belt use discussed  ?Sunscreen use discussed. No changing moles on skin.  ?Sleep - averaging 6-9 hours/night ?Non smoker. Vaping.  ?Alcohol - seldom  ?Dentist - due, upcoming appt ?Eye exam yearly  ? ?Caffeine: 1 coke occasionally ?Lives alone  ?Works as traveling Health visitor ?Activity: no regular exercise  ?Diet: good water, fruits/vegetables daily  ?   ? ?Relevant past medical, surgical, family and social history reviewed and updated as indicated. Interim medical history since our last visit reviewed. ?Allergies and medications reviewed and updated. ?Outpatient Medications Prior to Visit  ?Medication Sig Dispense Refill  ? amphetamine-dextroamphetamine (ADDERALL XR) 20 MG 24 hr capsule Take 1 capsule (20 mg total) by mouth every morning. 30 capsule 0  ? [START ON 10/19/2021] amphetamine-dextroamphetamine (ADDERALL XR) 20 MG 24 hr capsule Take 1 capsule (20 mg total) by mouth every morning. 30 capsule 0  ? DULoxetine (CYMBALTA) 60 MG capsule Take 1 capsule (60 mg total) by mouth daily. 90 capsule 1  ? ondansetron (ZOFRAN) 4 MG tablet Take 1 tablet (4 mg total) by mouth as needed. 20 tablet 0  ? pantoprazole (PROTONIX) 40  MG tablet Take 1 tablet (40 mg total) by mouth daily. 90 tablet 2  ? QUEtiapine (SEROQUEL) 50 MG tablet Take 1-2 tablets (50-100 mg total) by mouth at bedtime. 180 tablet 1  ? Semaglutide-Weight Management (WEGOVY) 1.7 MG/0.75ML SOAJ Inject 1.7 mg into the skin once a week. 3 mL 0  ? spironolactone (ALDACTONE) 50 MG tablet Take 1 tablet (50 mg total) by mouth 2 (two) times daily. 180 tablet 1  ? amphetamine-dextroamphetamine (ADDERALL XR) 20 MG 24 hr  capsule Take 1 capsule (20 mg total) by mouth in the morning. 30 capsule 0  ? amphetamine-dextroamphetamine (ADDERALL XR) 20 MG 24 hr capsule Take 1 capsule (20 mg total) by mouth in the morning. 30 capsule 0  ? amphetamine-dextroamphetamine (ADDERALL XR) 20 MG 24 hr capsule Take 1 capsule (20 mg total) by mouth in the morning. 30 capsule 0  ? amphetamine-dextroamphetamine (ADDERALL XR) 20 MG 24 hr capsule Take 1 capsule (20 mg total) by mouth every morning. 30 capsule 0  ? DULoxetine (CYMBALTA) 60 MG capsule TAKE 1 CAPSULE BY MOUTH 2 TIMES DAILY (Patient not taking: No sig reported) 60 capsule 3  ? DULoxetine (CYMBALTA) 60 MG capsule Take 1 capsule (60 mg total) by mouth 2 (two) times daily. (Patient not taking: No sig reported) 60 capsule 3  ? DULoxetine (CYMBALTA) 60 MG capsule Take 1 capsule (60 mg total) by mouth daily. 90 capsule 1  ? DULoxetine (CYMBALTA) 60 MG capsule TAKE 1 CAPSULE BY MOUTH 2 TIMES DAILY (Patient not taking: No sig reported) 60 capsule 0  ? HYDROcodone-acetaminophen (NORCO/VICODIN) 5-325 MG tablet Take 1 tablet by mouth every 6 (six) hours as needed for pain for up to 5 days 10 tablet 0  ? oxyCODONE (OXY IR/ROXICODONE) 5 MG immediate release tablet Take 1-2 tablets (5-10 mg total) by mouth every 6 (six) hours as needed for moderate pain, severe pain or breakthrough pain. 10 tablet 0  ? pantoprazole (PROTONIX) 40 MG tablet Take 1 tablet (40 mg total) by mouth daily. (Patient not taking: No sig reported) 90 tablet 3  ? pantoprazole (PROTONIX) 40 MG tablet Take 1 tablet (40 mg total) by mouth daily. 90 tablet 2  ? pantoprazole (PROTONIX) 40 MG tablet TAKE 1 TABLET BY MOUTH ONCE DAILY 90 tablet 0  ? podofilox (CONDYLOX) 0.5 % external solution APPLY BY TOPICAL ROUTE 2 TIMES PER DAY FOR 3 DAYS THEN STOP FOR 4 DAYS. (REPEAT 7DAY CYCLE UNTIL NO VISIBLE WART TISSUE/MAX OF FOUR CYCLES) 3.5 mL 1  ? podofilox (CONDYLOX) 0.5 % gel APPLY BY TOPICALLY 2 TIMES PER DAY FOR 3 DAYS THEN STOP FOR 4 DAYS.  (REPEAT 7 DAY CYCLE UNTIL NO VISIBLE WART TISSUE/MAX OF FOUR CYCLES) 7 g 1  ? QUEtiapine (SEROQUEL) 50 MG tablet TAKE 1-2 TABLETS BY MOUTH DAILY AT BEDTIME (Patient not taking: No sig reported) 60 tablet 0  ? QUEtiapine (SEROQUEL) 50 MG tablet Take 1-2 tablets (50-100 mg total) by mouth at bedtime. 60 tablet 1  ? QUEtiapine (SEROQUEL) 50 MG tablet Take 1-2 tablets (50-100 mg total) by mouth at bedtime. 60 tablet 1  ? spironolactone (ALDACTONE) 50 MG tablet Take 50 mg by mouth 2 (two) times daily.    ? ?No facility-administered medications prior to visit.  ?  ? ?Per HPI unless specifically indicated in ROS section below ?Review of Systems  ?Constitutional:  Positive for appetite change (decreased with wegovy). Negative for activity change, chills, fatigue, fever and unexpected weight change.  ?HENT:  Negative for hearing  loss.   ?Eyes:  Negative for visual disturbance.  ?Respiratory:  Negative for cough, chest tightness, shortness of breath and wheezing.   ?Cardiovascular:  Positive for palpitations. Negative for chest pain and leg swelling.  ?Gastrointestinal:  Positive for abdominal pain (cramping occ), constipation and diarrhea. Negative for abdominal distention, blood in stool, nausea and vomiting.  ?Genitourinary:  Negative for difficulty urinating and hematuria.  ?Musculoskeletal:  Positive for arthralgias, back pain, myalgias and neck pain.  ?Skin:  Negative for rash.  ?Neurological:  Negative for dizziness, seizures, syncope and headaches.  ?Hematological:  Negative for adenopathy. Bruises/bleeds easily.  ?Psychiatric/Behavioral:  Positive for dysphoric mood. The patient is nervous/anxious.   ? ?Objective:  ?BP 102/70   Pulse 78   Temp 97.9 ?F (36.6 ?C) (Temporal)   Ht $R'5\' 9"'hS$  (1.753 m)   Wt 219 lb 2 oz (99.4 kg)   LMP 09/06/2021 (Exact Date)   SpO2 100%   BMI 32.36 kg/m?   ?Wt Readings from Last 3 Encounters:  ?09/27/21 219 lb 2 oz (99.4 kg)  ?09/17/21 221 lb 9.6 oz (100.5 kg)  ?03/16/21 232 lb (105.2  kg)  ?  ?  ?Physical Exam ?Vitals and nursing note reviewed.  ?Constitutional:   ?   Appearance: Normal appearance. She is not ill-appearing.  ?HENT:  ?   Head: Normocephalic and atraumatic.  ?   Right Ear: Tympanic m

## 2021-09-27 NOTE — Patient Instructions (Addendum)
Labs today  ?We will check on re-referral to genetics vs rheum.  ?Good to see you today.  ?Return as needed or in 1 year for next physical.  ? ?Health Maintenance, Female ?Adopting a healthy lifestyle and getting preventive care are important in promoting health and wellness. Ask your health care provider about: ?The right schedule for you to have regular tests and exams. ?Things you can do on your own to prevent diseases and keep yourself healthy. ?What should I know about diet, weight, and exercise? ?Eat a healthy diet ? ?Eat a diet that includes plenty of vegetables, fruits, low-fat dairy products, and lean protein. ?Do not eat a lot of foods that are high in solid fats, added sugars, or sodium. ?Maintain a healthy weight ?Body mass index (BMI) is used to identify weight problems. It estimates body fat based on height and weight. Your health care provider can help determine your BMI and help you achieve or maintain a healthy weight. ?Get regular exercise ?Get regular exercise. This is one of the most important things you can do for your health. Most adults should: ?Exercise for at least 150 minutes each week. The exercise should increase your heart rate and make you sweat (moderate-intensity exercise). ?Do strengthening exercises at least twice a week. This is in addition to the moderate-intensity exercise. ?Spend less time sitting. Even light physical activity can be beneficial. ?Watch cholesterol and blood lipids ?Have your blood tested for lipids and cholesterol at 35 years of age, then have this test every 5 years. ?Have your cholesterol levels checked more often if: ?Your lipid or cholesterol levels are high. ?You are older than 35 years of age. ?You are at high risk for heart disease. ?What should I know about cancer screening? ?Depending on your health history and family history, you may need to have cancer screening at various ages. This may include screening for: ?Breast cancer. ?Cervical  cancer. ?Colorectal cancer. ?Skin cancer. ?Lung cancer. ?What should I know about heart disease, diabetes, and high blood pressure? ?Blood pressure and heart disease ?High blood pressure causes heart disease and increases the risk of stroke. This is more likely to develop in people who have high blood pressure readings or are overweight. ?Have your blood pressure checked: ?Every 3-5 years if you are 22-23 years of age. ?Every year if you are 62 years old or older. ?Diabetes ?Have regular diabetes screenings. This checks your fasting blood sugar level. Have the screening done: ?Once every three years after age 49 if you are at a normal weight and have a low risk for diabetes. ?More often and at a younger age if you are overweight or have a high risk for diabetes. ?What should I know about preventing infection? ?Hepatitis B ?If you have a higher risk for hepatitis B, you should be screened for this virus. Talk with your health care provider to find out if you are at risk for hepatitis B infection. ?Hepatitis C ?Testing is recommended for: ?Everyone born from [birth year] through 1964. ?Anyone with known risk factors for hepatitis C. ?Sexually transmitted infections (STIs) ?Get screened for STIs, including gonorrhea and chlamydia, if: ?You are sexually active and are younger than 35 years of age. ?You are older than 35 years of age and your health care provider tells you that you are at risk for this type of infection. ?Your sexual activity has changed since you were last screened, and you are at increased risk for chlamydia or gonorrhea. Ask your health care provider if  you are at risk. ?Ask your health care provider about whether you are at high risk for HIV. Your health care provider may recommend a prescription medicine to help prevent HIV infection. If you choose to take medicine to prevent HIV, you should first get tested for HIV. You should then be tested every 3 months for as long as you are taking the  medicine. ?Pregnancy ?If you are about to stop having your period (premenopausal) and you may become pregnant, seek counseling before you get pregnant. ?Take 400 to 800 micrograms (mcg) of folic acid every day if you become pregnant. ?Ask for birth control (contraception) if you want to prevent pregnancy. ?Osteoporosis and menopause ?Osteoporosis is a disease in which the bones lose minerals and strength with aging. This can result in bone fractures. If you are 27 years old or older, or if you are at risk for osteoporosis and fractures, ask your health care provider if you should: ?Be screened for bone loss. ?Take a calcium or vitamin D supplement to lower your risk of fractures. ?Be given hormone replacement therapy (HRT) to treat symptoms of menopause. ?Follow these instructions at home: ?Alcohol use ?Do not drink alcohol if: ?Your health care provider tells you not to drink. ?You are pregnant, may be pregnant, or are planning to become pregnant. ?If you drink alcohol: ?Limit how much you have to: ?0-1 drink a day. ?Know how much alcohol is in your drink. In the [place] one drink equals one 12 oz bottle of beer (355 mL), one 5 oz glass of wine (148 mL), or one 1? oz glass of hard liquor (44 mL). ?Lifestyle ?Do not use any products that contain nicotine or tobacco. These products include cigarettes, chewing tobacco, and vaping devices, such as e-cigarettes. If you need help quitting, ask your health care provider. ?Do not use street drugs. ?Do not share needles. ?Ask your health care provider for help if you need support or information about quitting drugs. ?General instructions ?Schedule regular health, dental, and eye exams. ?Stay current with your vaccines. ?Tell your health care provider if: ?You often feel depressed. ?You have ever been abused or do not feel safe at home. ?Summary ?Adopting a healthy lifestyle and getting preventive care are important in promoting health and wellness. ?Follow your health care  provider's instructions about healthy diet, exercising, and getting tested or screened for diseases. ?Follow your health care provider's instructions on monitoring your cholesterol and blood pressure. ?This information is not intended to replace advice given to you by your health care provider. Make sure you discuss any questions you have with your health care provider. ?Document Revised: 10/05/2020 Document Reviewed: 10/05/2020 ?Elsevier Patient Education ? [place]. ? ?

## 2021-09-28 ENCOUNTER — Other Ambulatory Visit (HOSPITAL_COMMUNITY): Payer: Self-pay

## 2021-09-28 ENCOUNTER — Encounter: Payer: Self-pay | Admitting: Family Medicine

## 2021-09-28 DIAGNOSIS — E669 Obesity, unspecified: Secondary | ICD-10-CM | POA: Insufficient documentation

## 2021-09-28 DIAGNOSIS — E66811 Obesity, class 1: Secondary | ICD-10-CM | POA: Insufficient documentation

## 2021-09-28 LAB — RNP ANTIBODY: Ribonucleic Protein(ENA) Antibody, IgG: <1 AI

## 2021-09-28 LAB — ANA: Anti Nuclear Antibody (ANA): NEGATIVE

## 2021-09-28 LAB — HEPATITIS C ANTIBODY
Hepatitis C Ab: NONREACTIVE
SIGNAL TO CUT-OFF: 0.02 (ref <1.00)

## 2021-09-28 MED ORDER — WEGOVY 2.4 MG/0.75ML ~~LOC~~ SOAJ
2.4000 mg | SUBCUTANEOUS | 1 refills | Status: DC
  Filled 2021-09-28 – 2021-09-30 (×2): qty 3, 28d supply, fill #0
  Filled 2021-10-28: qty 3, 28d supply, fill #1

## 2021-09-28 NOTE — Assessment & Plan Note (Signed)
Continue daily PPI.  

## 2021-09-28 NOTE — Assessment & Plan Note (Signed)
Sees psychiatry.  ?

## 2021-09-28 NOTE — Assessment & Plan Note (Signed)
Followed by gynecology.  ?

## 2021-09-28 NOTE — Assessment & Plan Note (Signed)
Update FLP off statin.  ?

## 2021-09-28 NOTE — Assessment & Plan Note (Signed)
On wegovy through GYN.  ?Encouraged healthy diet and lifestyle choices.  ?

## 2021-09-28 NOTE — Assessment & Plan Note (Signed)
Followed by psychiatry on adderall, cymbalta, seroquel.  ?

## 2021-09-28 NOTE — Assessment & Plan Note (Signed)
?  Ehler danlos vs marfan - saw rheum 2017 - will refer back.  ?[facility] clinic does not see CTD.  ?Will see if [facility] will see patient for further evaluation.  ?She is planning to harvest eggs for fertility preservation and needs formal diagnosis to determine pregnancy eligibility.  ?

## 2021-09-30 ENCOUNTER — Other Ambulatory Visit (HOSPITAL_COMMUNITY): Payer: Self-pay

## 2021-10-02 ENCOUNTER — Telehealth: Payer: 59 | Admitting: Nurse Practitioner

## 2021-10-02 DIAGNOSIS — J40 Bronchitis, not specified as acute or chronic: Secondary | ICD-10-CM | POA: Diagnosis not present

## 2021-10-02 MED ORDER — PREDNISONE 20 MG PO TABS: 40.0000 mg | ORAL_TABLET | Freq: Every day | ORAL | 0 refills | Status: AC

## 2021-10-02 MED ORDER — AZITHROMYCIN 250 MG PO TABS: ORAL_TABLET | ORAL | 0 refills | Status: DC

## 2021-10-02 NOTE — Progress Notes (Signed)
We are sorry that you are not feeling well.  Here is how we plan to help! ? ?Based on your presentation I believe you most likely have A cough due to bacteria.  When patients have a fever and a productive cough with a change in color or increased sputum production, we are concerned about bacterial bronchitis.  If left untreated it can progress to pneumonia.  If your symptoms do not improve with your treatment plan it is important that you contact your provider.   I have prescribed z pak as directed ?  ?In addition you may use A non-prescription cough medication called Mucinex DM: take 2 tablets every 12 hours. ? ?Prednisone '20mg'$  2 tablets at same time daily for 5 days. ? ?From your responses in the eVisit questionnaire you describe inflammation in the upper respiratory tract which is causing a significant cough.  This is commonly called Bronchitis and has four common causes:   ?Allergies ?Viral Infections ?Acid Reflux ?Bacterial Infection ?Allergies, viruses and acid reflux are treated by controlling symptoms or eliminating the cause. An example might be a cough caused by taking certain blood pressure medications. You stop the cough by changing the medication. Another example might be a cough caused by acid reflux. Controlling the reflux helps control the cough. ? ?USE OF BRONCHODILATOR ("RESCUE") INHALERS: ?There is a risk from using your bronchodilator too frequently.  The risk is that over-reliance on a medication which only relaxes the muscles surrounding the breathing tubes can reduce the effectiveness of medications prescribed to reduce swelling and congestion of the tubes themselves.  Although you feel brief relief from the bronchodilator inhaler, your asthma may actually be worsening with the tubes becoming more swollen and filled with mucus.  This can delay other crucial treatments, such as oral steroid medications. If you need to use a bronchodilator inhaler daily, several times per day, you should discuss  this with your provider.  There are probably better treatments that could be used to keep your asthma under control.  ?   ?HOME CARE ?Only take medications as instructed by your medical team. ?Complete the entire course of an antibiotic. ?Drink plenty of fluids and get plenty of rest. ?Avoid close contacts especially the very young and the elderly ?Cover your mouth if you cough or cough into your sleeve. ?Always remember to wash your hands ?A steam or ultrasonic humidifier can help congestion.  ? ?GET HELP RIGHT AWAY IF: ?You develop worsening fever. ?You become short of breath ?You cough up blood. ?Your symptoms persist after you have completed your treatment plan ?MAKE SURE YOU  ?Understand these instructions. ?Will watch your condition. ?Will get help right away if you are not doing well or get worse. ?  ? ?Thank you for choosing an e-visit. ? ?Your e-visit answers were reviewed by a board certified advanced clinical practitioner to complete your personal care plan. Depending upon the condition, your plan could have included both over the counter or prescription medications. ? ?Please review your pharmacy choice. Make sure the pharmacy is open so you can pick up prescription now. If there is a problem, you may contact your provider through [employer] and have the prescription routed to another pharmacy.  Your safety is important to [place]. If you have drug allergies check your prescription carefully.  ? ?For the next 24 hours you can use MyChart to ask questions about today's visit, request a non-urgent call back, or ask for a work or school excuse. ?You will get  an email in the next two days asking about your experience. I hope that your e-visit has been valuable and will speed your recovery. ?5-10 minutes spent reviewing and documenting in chart. ? ?

## 2021-10-11 NOTE — Progress Notes (Signed)
MEDICAL GENETICS NEW PATIENT EVALUATION  Patient name: Heather Stewart DOB: [birth year] Age: 36 y.o. MRN: [id number]  Referring Provider/Specialty: Jeral Pinch, MD / Oncology Date of Evaluation: 10/14/2021 Chief Complaint/Reason for Referral: Marfanoid habitus, Generalized hypermobility of joints, Connective tissue disorder  HPI: Heather Stewart is a 36 y.o. female who presents today for an initial genetics evaluation due to concern for a possible connective tissue disorder. She is unaccompanied at today's visit.  Heather Stewart has various features of a connective tissue disorder. The initial concern arose when she was in high school and noted to have ocular lens dislocation bilaterally. At this time she was told she likely has Marfan syndrome or homocystinuria. She underwent a procedure on the left eye in 2019 but had multiple complications and opted not to operate on the right eye. She wears contacts for corrective vision and has astigmatism. Heather Stewart has followed with cardiology fairly regularly and EKG and ECHO have been normal (most recent was last summer following an episode of syncope and elevated heart rate).   Heather Stewart also has various musculoskeletal concerns, including scoliosis, flat feet, hypermobility, frequent subluxations of various joints, and chronic pain. She has tried physical therapy but this did not help. She has an appointment with neurosurgery to assess for causes of her pain on 10/26/2021. Kamilya has seen rheumatology in the past (2017) for possible connective tissue disorder and was recommended to undergo genetics evaluation (attempted to go to [facility] but was unsuccessful). She was not tested for rheumatological conditions. Her PCP ran some screening labs (ANA and ENA) that were normal and referred back to Rheumatology recently and she is awaiting an appointment. Heather Stewart also notes poor/slow wound healing, easy bruising and scarring, and stretch marks. She has had difficulty  healing from previous surgeries.  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 She was referred to a cancer genetic counselor but cancelled the visit; she feels she has adequate information and a plan regarding this diagnosis already. She receives breast imaging every 6 months (alternating between MRIs and mammograms). She has discussed risk reducing options such as mastectomy with reconstruction and oophorectomy. Heather Stewart has not had children but is interested. She recently learned that her ovarian reserve is low and is planning to freeze some of her eggs. Given these considerations, Heather Stewart would like to know if she has a connective tissue disorder that may make surgical procedures and pregnancy difficult or dangerous.  Prior genetic testing has not been performed.  Past Medical History: Past Medical History:  Diagnosis Date   Acne    ADHD (attention deficit hyperactivity disorder), combined type    dx as child and then received through psychiatry - reviewed records from psych: no recent w/u.  was on vyvanse during nursing school   Adjustment disorder    Anxiety    Chronic back pain    thoracic and lumbar - s/p unrevealing NSG eval    Connective tissue disease ([place])    ocular lens dislocation, flat feet, high palate, no cardiac involvement   Esophageal reflux    Eye problems 08/2011   subluxated lenses OU, rec test for homocyetinuria to r/o as cause (see scanned form)   Focal nodular hyperplasia of liver 11/10/2018   By MRI at [facility] 07/2018 - see scanned report   Hx of migraines    Irritable bowel syndrome    MDD (major depressive disorder), recurrent episode ([place])    Pneumonia    Screening examination for pulmonary tuberculosis  Swelling, mass, or lump in head and neck    Uvulitis 10/02/2018   Patient Active Problem List   Diagnosis Date Noted   Obesity, Class I, BMI 30-34.9 09/28/2021   Low grade squamous intraepithelial lesion (LGSIL) of vulva 09/19/2021   Syncope  and collapse 07/13/2020   Marfanoid habitus 07/13/2020   Breast cancer screening, high risk patient 01/29/2019   Focal nodular hyperplasia of liver 11/10/2018   Adult acne 11/01/2018   Allergic rhinitis 11/01/2018   Dyslipidemia 64/68/0321   Monoallelic mutation of PALB2 gene 05/14/2018   Generalized hypermobility of joints 05/06/2016   Myopia with astigmatism 09/13/2011   Subluxation of both lenses 09/13/2011   Connective tissue disorder ([place]) 03/08/2011   Encounter for general adult medical examination with abnormal findings 03/08/2011   GERD 07/08/2010   Abdominal pain, epigastric 07/08/2010   MDD (major depressive disorder), recurrent episode, moderate ([place]) 05/12/2010   IRRITABLE BOWEL SYNDROME 05/08/2009   DIZZINESS 05/08/2009   SWELLING MASS OR LUMP IN HEAD AND NECK 09/05/2008   MIGRAINES, HX OF 10/03/2007   Attention deficit hyperactivity disorder (ADHD) 10/03/2007    Past Surgical History:  Past Surgical History:  Procedure Laterality Date   BREAST LUMPECTOMY Right 03/2018   CHOLECYSTECTOMY N/A 03/16/2021   Procedure: LAPAROSCOPIC CHOLECYSTECTOMY WITH ICG DYE;  Surgeon: Rolm Bookbinder, MD;  Location: [facility];  Service: General;  Laterality: N/A;   ESOPHAGOGASTRODUODENOSCOPY  07/2018   gastric polyps biopsied, mild acute ?alcoholic gastritis (Dr Jabier Mutton in [place])   Staples removed in [place], [place]     Left wrist   MYRINGOTOMY  multiple   had tissue from behind ears implanted into bilateral TMs to close (1990s)   [place] Left 2018   2 tension rings placed, lens replaced Isaiah Blakes) [place] [facility]    Social History: Social History   Social History Narrative   Single      No children      Caffeine: 1 coke occasionally      Works at [employer] alone, 1 dog  Parents available for testing if needed  Medications: Current Outpatient Medications on File Prior to  Visit  Medication Sig Dispense Refill   amphetamine-dextroamphetamine (ADDERALL XR) 20 MG 24 hr capsule Take 1 capsule (20 mg total) by mouth every morning. 30 capsule 0   [START ON 10/19/2021] amphetamine-dextroamphetamine (ADDERALL XR) 20 MG 24 hr capsule Take 1 capsule (20 mg total) by mouth every morning. 30 capsule 0   DULoxetine (CYMBALTA) 60 MG capsule Take 1 capsule (60 mg total) by mouth daily. 90 capsule 1   ondansetron (ZOFRAN) 4 MG tablet Take 1 tablet (4 mg total) by mouth as needed. 20 tablet 0   pantoprazole (PROTONIX) 40 MG tablet Take 1 tablet (40 mg total) by mouth daily. 90 tablet 2   QUEtiapine (SEROQUEL) 50 MG tablet Take 1-2 tablets (50-100 mg total) by mouth at bedtime. 180 tablet 1   Semaglutide-Weight Management (WEGOVY) 2.4 MG/0.75ML SOAJ Inject 2.4 mg into the skin once a week. 3 mL 1   azithromycin (ZITHROMAX Z-PAK) 250 MG tablet As directed (Patient not taking: Reported on 10/14/2021) 6 tablet 0   Semaglutide-Weight Management (WEGOVY) 1.7 MG/0.75ML SOAJ Inject 1.7 mg into the skin once a week. (Patient not taking: Reported on 10/14/2021) 3 mL 0   spironolactone (ALDACTONE) 50 MG tablet Take 1 tablet (50 mg  total) by mouth 2 (two) times daily. (Patient not taking: Reported on 10/14/2021) 180 tablet 1   [DISCONTINUED] ARIPiprazole (ABILIFY) 5 MG tablet Take 1 tablet (5 mg total) by mouth daily. (Patient not taking: No sig reported) 30 tablet 3   No current facility-administered medications on file prior to visit.    Allergies:  Allergies  Allergen Reactions   Amoxicillin-Pot Clavulanate Other (See Comments)    Severe GI upset Diarrhea; per MD's office, tolerates amoxicillin (clavulanic acid is the issue)   Neosporin [Neomycin-Bacitracin Zn-Polymyx] Rash   Abilify [Aripiprazole]     akathisia   Bupropion     Akathisia   Doxycycline Other (See Comments)    abd pain   Vyvanse [Lisdexamfetamine Dimesylate] Other (See Comments)    headache    Immunizations: up to  date  Review of Systems: General: Appropriate growth Eyes/vision: bilateral lens dislocation as a teenager; astigmatism; needs contacts for corrective vision Ears/hearing: lots of ear infections as a child and some as adult. Narrow eustachian tubes- tubes placed in 2012. Dental: "soft teeth as a child"- multiple fillings, root canal. Dental crowding- had teeth pulled and then braces.  Respiratory: sleep apnea- has CPAP but doesn't fully use.  Cardiovascular: follows with cardiology- normal ECHO and EKGs in the past. Gastrointestinal: IBS, GERD. Two EGDs with moderate esophageal erosion. Hard time losing weight- currently on medication. Gallbladder surgery in October 2022 for cholecystitis. Genitourinary: no concerns. Endocrine: no concerns. Hematologic: easy bruising. Immunologic: no concerns. Neurological: no concerns. No learning/intellectual disability. Psychiatric: ADHD. Depression. Anxiety. Musculoskeletal: Chronic pain. Joint subluxations. Tremors occasionally in hands. Occasionally tingling sensation in feet and shoulder. Skin, Hair, Nails: acne, stretch marks, poor wound healing and scarring. A few birth marks. Hair and nails are brittle.  Family History: See pedigree below obtained during today's visit:    Notable family history: Heather Stewart is the only child between her parents. She has a paternal half brother who had a "dead" kidney at birth that was removed and has a "hunchback." Information regarding the brother is otherwise limited. There is a maternal half sister who has a chiari malformation and back issues. She has not undergone genetic testing for the PALB2 variant identified in [place]. A maternal half brother was diagnosed at 72 yo with desmoplastic small round cell tumor and subsequently died at 35 yo. Heather Stewart's father is 5'7". He has joint pain, glaucoma, and "retina problems." Heather Stewart's mother is 49'7" and has kidney disease and experienced bowel and bladder prolapse after giving  birth. She has been identified to have the PALB2 pathogenic variant. Heather Stewart's maternal uncle has degenerative disk disease. There is a history of cancer in other maternal relatives.   Mother's ethnicity: Caucasian Father's ethnicity: Caucasian Consanguinity: Denies  Physical Examination: Weight: 97.8 kg Height: 5'9" or 175.3 cm; mid-parental predicated is 5'4.4" Head circumference: 54.6 cm  Ht $R'5\' 9"'Qi$  (1.753 m)   Wt 215 lb 9.6 oz (97.8 kg)   HC 54.6 cm (21.5")   BMI 31.84 kg/m   Armspan: 182 cm Armspan to body height ratio: 1.04   Upper segment: did not obtain Lower segment: did not obtain Upper to lower segment ratio: n/a  Marfan exam: -- Wrist and thumb sign: no -- Pectus deformity (carinatum or excavatum): no -- Hindfoot deformity: no -- Flat foot: yes -- Dural ectasia: no -- Spontaneous pneumothorax: no -- Protucio acetabulae: no -- Scoliosis or thoracolumbar kyphosis: yes -- Reduced elbow extension: no -- 3/5 facial features: no -- Skin striae: yes -- Severe myopia: no --  Mitral valve prolapse: no Total score: 3 (score >/= 7 is considered a positive systemic score)   Beighton score: -- Passive dorsiflexion and hyperextension of fifth MCP joint beyond 90 degrees: left: yes/ right: yes -- Passive apposition of thumb to flexor aspect of forearm: left: no, not without bending elbow/right: no, not without bending elbow -- Passive hyperextension of elbow beyond 10 degrees: left: yes/ right: yes -- Passive hyperextension of the knee beyond 10 degrees: left: yes/ right: yes -- Active forward flexion of the trunk with knees fully extended so that palms of hands rest flat on the floor: yes Total score: 7/9 ("major" criteria for EDS is score >/=5)  General: Alert, interactive Head: Normocephalic; no malar flattening Eyes: Normoset, Normal lids, lashes, brows Nose: Normal appearance Lips/Mouth/Teeth: Normal appearance Ears: Normoset and normally formed, no pits, tags or  creases Neck: Normal appearance Chest: No pectus deformities Heart: Warm and well perfused Lungs: No increased work of breathing Skin: Striae on lower abdomen; no unusual scars; skin has normal texture and turgor, no hyperextensibility Hair: Normal anterior and posterior hairline Neurologic: Normal gross motor by observation, no abnormal movements Psych: Appropriate interactions Extremities: Symmetric and proportionate Hands/Feet: Moderately long fingers and toes otherwise normal appearance, 2 palmar creases bilaterally, flat feet, No clinodactyly, syndactyly or polydactyly  Prior Genetic testing: None  Pertinent Labs: None  Pertinent Imaging/Studies: Normal EKG, ECHO in 07/2020  Assessment: DALINA SAMARA is a 36 y.o. female with history of bilateral ocular lens dislocation as a teenager, scoliosis, flat feet, hypermobility, frequent subluxations of various joints, and chronic pain. She reports poor/slow wound healing, easy bruising and scarring, and stretch marks as a teenager. She has had difficulty healing from previous surgeries. She has had normal EKG and ECHO. While her arm span is longer than height, it does not exceed a ratio of 1.05 (it is very close, at 1.04). I do not think there is clear evidence of a specific connective tissue disorder based on her history and exam so I do think it is certainly reasonable to perform broader genetic testing to help rule in/out these conditions since phenotype can vary.  There are a range of connective tissue disorders caused by genetic variants. These conditions can be associated with a range of symptoms affecting the skeleton, joints, skin, eyes, heart, and more. Identification of a connective tissue disorder is important for assessing risk of health complications and providing recommendations for management and surveillance. Given Tiyona's symptoms, we recommend that she undergo testing through the [facility] Connective Tissue Disorders panel (92  genes). This panel includes the gene associated with Marfan syndrome, FBN1 (note, Hara does not meet clinical criteria for Marfan syndrome and she had a systemic score of 3) as well as several other genes with similar features including the types of Ehlers Danlos syndrome with a known gene. Ocular lens dislocations can be seen in Marfan and homocystinuria, both of which are on this gene panel.  Possible outcomes of any gene sequencing study include: positive, negative, and variant of uncertain significance. Positive means a pathogenic variant (mutation) was identified that causes a particular disorder or symptom. If positive, additional information regarding prognosis, management, and inheritance can be provided at that time. Negative means the gene is normal and no mutations were identified. Variant of uncertain significance (VUS) means a change in a gene was identified but it is unclear at this time if that particular change causes symptoms or if it is a harmless variation unique to that individual. In these  scenarios, parental testing can be helpful to potentially clarify significance of any VUS.  It is important to note that hypermobile type EDS currently has no known gene and therefore no genetic test can rule in/out this diagnosis specifically. Diagnostic criteria for hEDS can be found here (https://www.ehlers-danlos.com/wp-content/uploads/[email or web address]/09/hEDS-Dx-Criteria-checklist-1-Fillable-form.pdf) and does not require a geneticist to diagnose. We can assist with ruling out other genetic conditions as part of Criterion 3 today. Ruling out rheumatologic conditions (lupus, rheumatoid arthritis) is also important.   Recommendations: Connective tissue disorder gene panel ([facility]) Agree with Rheumatology evaluation  A buccal sample was obtained during today's visit for the above genetic testing and sent to [facility]. Results are anticipated in 2-4 weeks. We will contact the patient to discuss results once  available and arrange follow-up as needed. Of note -- patient will be of out town, returning 5/29.   Heidi Dach, MS, [facility] Certified Genetic Counselor  Artist Pais, D.O. Attending Physician, [facility] Date: 10/15/2021 Time: 2:23pm   Total time spent: 100 minutes Time spent includes face to face and non-face to face care for the patient on the date of this encounter (history and physical, genetic counseling, coordination of care, data gathering and/or documentation as outlined)

## 2021-10-14 ENCOUNTER — Ambulatory Visit (INDEPENDENT_AMBULATORY_CARE_PROVIDER_SITE_OTHER): Payer: 59 | Admitting: Pediatric Genetics

## 2021-10-14 ENCOUNTER — Encounter (INDEPENDENT_AMBULATORY_CARE_PROVIDER_SITE_OTHER): Payer: Self-pay | Admitting: Pediatric Genetics

## 2021-10-14 ENCOUNTER — Encounter: Payer: Self-pay | Admitting: *Deleted

## 2021-10-14 VITALS — Ht 69.0 in | Wt 215.6 lb

## 2021-10-14 DIAGNOSIS — H27113 Subluxation of lens, bilateral: Secondary | ICD-10-CM

## 2021-10-14 DIAGNOSIS — M2480 Other specific joint derangements of unspecified joint, not elsewhere classified: Secondary | ICD-10-CM

## 2021-10-15 NOTE — Patient Instructions (Signed)
At [facility], we are committed to providing exceptional care. You will receive a patient satisfaction survey through text or email regarding your visit today. Your opinion is important to me. Comments are appreciated.  Test ordered: Connective tissue disorders panel to [facility] Result expected in 2-4 weeks

## 2021-10-26 ENCOUNTER — Ambulatory Visit: Payer: 59 | Admitting: Podiatry

## 2021-10-26 DIAGNOSIS — M5441 Lumbago with sciatica, right side: Secondary | ICD-10-CM | POA: Diagnosis not present

## 2021-10-26 DIAGNOSIS — Z6831 Body mass index (BMI) 31.0-31.9, adult: Secondary | ICD-10-CM | POA: Diagnosis not present

## 2021-10-27 ENCOUNTER — Other Ambulatory Visit: Payer: Self-pay | Admitting: Neurosurgery

## 2021-10-27 ENCOUNTER — Other Ambulatory Visit (HOSPITAL_COMMUNITY): Payer: Self-pay | Admitting: Neurosurgery

## 2021-10-27 DIAGNOSIS — G8929 Other chronic pain: Secondary | ICD-10-CM

## 2021-10-28 ENCOUNTER — Other Ambulatory Visit (HOSPITAL_COMMUNITY): Payer: Self-pay

## 2021-11-05 ENCOUNTER — Telehealth (INDEPENDENT_AMBULATORY_CARE_PROVIDER_SITE_OTHER): Payer: Self-pay | Admitting: Pediatric Genetics

## 2021-11-05 NOTE — Telephone Encounter (Signed)
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 This causes a spectrum of conditions including marfan syndrome. I do believe this is the explanation for her lens dislocations as a teenager.  There were also 2 VUS (DCHS1, FOXE3).  We will set up a follow-up appointment for 7/11 at 4:15pm to discuss this.   Artist Pais, [place]

## 2021-11-12 ENCOUNTER — Ambulatory Visit (HOSPITAL_COMMUNITY)
Admission: RE | Admit: 2021-11-12 | Discharge: 2021-11-12 | Disposition: A | Discharge status: Home / Self Care | Payer: 59 | Source: Ambulatory Visit | Attending: Neurosurgery | Admitting: Neurosurgery

## 2021-11-12 DIAGNOSIS — M4316 Spondylolisthesis, lumbar region: Secondary | ICD-10-CM | POA: Diagnosis not present

## 2021-11-12 DIAGNOSIS — M5126 Other intervertebral disc displacement, lumbar region: Secondary | ICD-10-CM | POA: Diagnosis not present

## 2021-11-12 DIAGNOSIS — M5441 Lumbago with sciatica, right side: Secondary | ICD-10-CM | POA: Diagnosis not present

## 2021-11-12 DIAGNOSIS — G8929 Other chronic pain: Secondary | ICD-10-CM | POA: Diagnosis not present

## 2021-11-12 DIAGNOSIS — M5136 Other intervertebral disc degeneration, lumbar region: Secondary | ICD-10-CM | POA: Diagnosis not present

## 2021-11-12 DIAGNOSIS — M419 Scoliosis, unspecified: Secondary | ICD-10-CM | POA: Diagnosis not present

## 2021-11-23 ENCOUNTER — Other Ambulatory Visit (HOSPITAL_COMMUNITY): Payer: Self-pay

## 2021-11-23 MED ORDER — WEGOVY 2.4 MG/0.75ML ~~LOC~~ SOAJ
2.4000 mg | SUBCUTANEOUS | 1 refills | Status: DC
  Filled 2021-11-23: qty 3, 28d supply, fill #0
  Filled 2021-12-21: qty 3, 28d supply, fill #1

## 2021-11-24 ENCOUNTER — Other Ambulatory Visit (HOSPITAL_COMMUNITY): Payer: Self-pay

## 2021-11-24 DIAGNOSIS — E282 Polycystic ovarian syndrome: Secondary | ICD-10-CM | POA: Diagnosis not present

## 2021-11-24 DIAGNOSIS — Z3162 Encounter for fertility preservation counseling: Secondary | ICD-10-CM | POA: Diagnosis not present

## 2021-11-24 DIAGNOSIS — D251 Intramural leiomyoma of uterus: Secondary | ICD-10-CM | POA: Diagnosis not present

## 2021-11-24 DIAGNOSIS — E288 Other ovarian dysfunction: Secondary | ICD-10-CM | POA: Diagnosis not present

## 2021-11-25 DIAGNOSIS — M545 Low back pain, unspecified: Secondary | ICD-10-CM | POA: Diagnosis not present

## 2021-11-25 DIAGNOSIS — M2550 Pain in unspecified joint: Secondary | ICD-10-CM | POA: Diagnosis not present

## 2021-11-25 DIAGNOSIS — M546 Pain in thoracic spine: Secondary | ICD-10-CM | POA: Diagnosis not present

## 2021-11-25 DIAGNOSIS — G8929 Other chronic pain: Secondary | ICD-10-CM | POA: Diagnosis not present

## 2021-11-25 DIAGNOSIS — M2480 Other specific joint derangements of unspecified joint, not elsewhere classified: Secondary | ICD-10-CM | POA: Diagnosis not present

## 2021-11-25 DIAGNOSIS — M3500 Sicca syndrome, unspecified: Secondary | ICD-10-CM | POA: Diagnosis not present

## 2021-11-25 DIAGNOSIS — M542 Cervicalgia: Secondary | ICD-10-CM | POA: Diagnosis not present

## 2021-11-25 DIAGNOSIS — Z6831 Body mass index (BMI) 31.0-31.9, adult: Secondary | ICD-10-CM | POA: Diagnosis not present

## 2021-11-26 ENCOUNTER — Other Ambulatory Visit (HOSPITAL_COMMUNITY): Payer: Self-pay

## 2021-12-03 ENCOUNTER — Other Ambulatory Visit (HOSPITAL_COMMUNITY): Payer: Self-pay

## 2021-12-07 ENCOUNTER — Ambulatory Visit (INDEPENDENT_AMBULATORY_CARE_PROVIDER_SITE_OTHER): Payer: 59 | Admitting: Pediatric Genetics

## 2021-12-07 ENCOUNTER — Encounter (INDEPENDENT_AMBULATORY_CARE_PROVIDER_SITE_OTHER): Payer: Self-pay | Admitting: Pediatric Genetics

## 2021-12-07 VITALS — Ht 69.02 in | Wt 215.4 lb

## 2021-12-07 DIAGNOSIS — Q8740 Marfan's syndrome, unspecified: Secondary | ICD-10-CM

## 2021-12-07 DIAGNOSIS — Z1589 Genetic susceptibility to other disease: Secondary | ICD-10-CM

## 2021-12-08 NOTE — Progress Notes (Signed)
MEDICAL GENETICS FOLLOW-UP VISIT  Patient name: Heather Stewart DOB: [birth year] Age: 36 y.o. MRN: [id number]  Initial Referring Provider/Specialty: Jeral Pinch, MD / Oncology Date of Evaluation: 12/07/2021 Chief Complaint/Reason for Referral: Review genetic testing results  HPI: Heather Stewart is a 37 y.o. female who presents today for follow-up with Genetics to review results of genetic testing. She is unaccompanied at today's visit.  To review, their initial visit was on 10/14/2021 due to concern for a possible connective tissue disorder. 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 They return today to discuss these results.  Since that visit, she was evaluated by Rheumatology for joint pain, hypermobility, dry eye and dry mouth. She reports this work-up was negative, including for Sjogren's. She also saw neurosurgery due to pain. An MRI of the lumbar spine was done. Injections were recommended as an option for pain, but she is hesitant to proceed with this. She is interested in finding providers familiar with FBN1/Marfan syndrome. She also recently contracted COVID from work and is recovering. She has some residual shortness of breath today. She is planning to have egg retrieval this fall for reproductive options. Otherwise, she reports being overdue for Cardiology and Ophthalmology follow-up.   Past Medical History: Past Medical History:  Diagnosis Date   Acne    ADHD (attention deficit hyperactivity disorder), combined type    dx as child and then received through psychiatry - reviewed records from psych: no recent w/u.  was on vyvanse during nursing school   Adjustment disorder    Anxiety    Chronic back pain    thoracic and lumbar - s/p unrevealing NSG  eval    Connective tissue disease ([place])    ocular lens dislocation, flat feet, high palate, no cardiac involvement   Esophageal reflux    Eye problems 08/2011   subluxated lenses OU, rec test for homocyetinuria to r/o as cause (see scanned form)   Focal nodular hyperplasia of liver 11/10/2018   [facility] 07/2018 - see scanned report   Hx of migraines    Irritable bowel syndrome    MDD (major depressive disorder), recurrent episode ([place])    Pneumonia    Screening examination for pulmonary tuberculosis    Swelling, mass, or lump in head and neck    Uvulitis 10/02/2018   Patient Active Problem List   Diagnosis Date Noted   Obesity, Class I, BMI 30-34.9 09/28/2021   Low grade squamous intraepithelial lesion (LGSIL) of vulva 09/19/2021   Syncope and collapse 07/13/2020   Marfanoid habitus 07/13/2020   Breast cancer screening, high risk patient 01/29/2019   Focal nodular hyperplasia of liver 11/10/2018   Adult acne 11/01/2018   Allergic rhinitis 11/01/2018   Dyslipidemia 78/67/5449   Monoallelic mutation of PALB2 gene 05/14/2018   Generalized hypermobility of joints 05/06/2016   Myopia with astigmatism 09/13/2011   Subluxation of both lenses 09/13/2011   Connective tissue disorder ([place]) 03/08/2011   Encounter for general adult medical examination with abnormal findings 03/08/2011   GERD 07/08/2010   Abdominal pain, epigastric 07/08/2010   MDD (major depressive disorder), recurrent episode, moderate ([place]) 05/12/2010   IRRITABLE BOWEL SYNDROME 05/08/2009   DIZZINESS 05/08/2009   SWELLING MASS OR LUMP IN HEAD AND NECK 09/05/2008   MIGRAINES, HX OF 10/03/2007   Attention deficit  hyperactivity disorder (ADHD) 10/03/2007    Past Surgical History:  Past Surgical History:  Procedure Laterality Date   BREAST LUMPECTOMY Right 03/2018   CHOLECYSTECTOMY N/A 03/16/2021   Procedure: LAPAROSCOPIC CHOLECYSTECTOMY WITH ICG DYE;  Surgeon: Rolm Bookbinder, MD;   Location: [facility];  Service: General;  Laterality: N/A;   ESOPHAGOGASTRODUODENOSCOPY  07/2018   gastric polyps biopsied, mild acute ?alcoholic gastritis (Dr Jabier Mutton in [place])   [place] removed in [place], [place]     Left wrist   MYRINGOTOMY  multiple   had tissue from behind ears implanted into bilateral TMs to close (1990s)   [place] Left 2018   2 tension rings placed, lens replaced Isaiah Blakes) [place] [facility]   Social History: Social History   Social History Narrative   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, 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 30 capsule 0   amphetamine-dextroamphetamine (ADDERALL XR) 20 MG 24 hr capsule Take 1 capsule (20 mg total) by mouth every morning. 30 capsule 0   Coenzyme Q10 (COQ10 PO) Take 1 capsule by mouth daily.     DULoxetine (CYMBALTA) 60 MG capsule Take 1 capsule (60 mg total) by mouth daily. 90 capsule 1   ondansetron (ZOFRAN) 4 MG tablet Take 1 tablet (4 mg total) by mouth as needed. 20 tablet 0   pantoprazole (PROTONIX) 40 MG tablet Take 1 tablet (40 mg total) by mouth daily. 90 tablet 2   Prenatal Multivit-Min-Fe-FA (PRENATAL, W/IRON & FA,) 27-0.8 MG TABS Take 1 tablet by mouth daily.     QUEtiapine (SEROQUEL) 50 MG tablet Take 1-2 tablets (50-100 mg total) by mouth at bedtime. 180 tablet 1   Semaglutide-Weight Management (WEGOVY) 2.4 MG/0.75ML SOAJ Inject 2.4 mg into the skin once a week. 3 mL 1   azithromycin (ZITHROMAX Z-PAK) 250 MG tablet As directed (Patient not taking: Reported on 10/14/2021) 6 tablet 0   Semaglutide-Weight Management (WEGOVY) 1.7 MG/0.75ML SOAJ Inject 1.7 mg into the skin once a week. (Patient not  taking: Reported on 10/14/2021) 3 mL 0   spironolactone (ALDACTONE) 50 MG tablet Take 1 tablet (50 mg total) by mouth 2 (two) times daily. (Patient not taking: Reported on 10/14/2021) 180 tablet 1   [DISCONTINUED] ARIPiprazole (ABILIFY) 5 MG tablet Take 1 tablet (5 mg total) by mouth daily. (Patient not taking: No sig reported) 30 tablet 3   No current facility-administered medications on file prior to visit.    Allergies:  Allergies  Allergen Reactions   Amoxicillin-Pot Clavulanate Other (See Comments)    Severe GI upset Diarrhea; per MD's office, tolerates amoxicillin (clavulanic acid is the issue)   Neosporin [Neomycin-Bacitracin Zn-Polymyx] Rash   Abilify [Aripiprazole]     akathisia   Bupropion     Akathisia   Doxycycline Other (See Comments)    abd pain   Vyvanse [Lisdexamfetamine Dimesylate] Other (See Comments)    headache    Immunizations: Up to date  Review of Systems (updates in bold): General: Appropriate growth Eyes/vision: bilateral lens dislocation as a teenager; astigmatism; needs contacts for corrective vision Ears/hearing: lots of ear infections as a child and some as adult. Narrow eustachian tubes- tubes placed in 2012.  Dental: "soft teeth as a child"- multiple fillings, root canal. Dental crowding- had teeth pulled and then braces.  Respiratory: sleep apnea- has CPAP but doesn't fully use.  Cardiovascular: follows with cardiology- normal ECHO and EKGs in the past. Gastrointestinal: IBS, GERD. Two EGDs with moderate esophageal erosion. Hard time losing weight- currently on medication. Gallbladder surgery in October 2022 for cholecystitis. Genitourinary: no concerns. Endocrine: no concerns. Egg retrieval this fall. Hematologic: easy bruising. Immunologic: no concerns. Neurological: no concerns. No learning/intellectual disability. Psychiatric: ADHD. Depression. Anxiety. Musculoskeletal: Chronic pain. Joint subluxations. Tremors occasionally in hands.  Occasionally tingling sensation in feet and shoulder. Recent negative rheum evaluation. Skin, Hair, Nails: acne, stretch marks, poor wound healing and scarring. A few birth marks. Hair and nails are brittle.  Family History: No updates to family history since last visit  Physical Examination: Weight: 97.7 kg Height: 175.3 cm   Ht 5' 9.02" (1.753 m)   Wt 215 lb 6.4 oz (97.7 kg)   BMI 31.79 kg/m   General: Alert, no acute distress Heart: well-perfused Lungs: No increased work of breathing Neuro: No gross motor deficits by observation Psych: Appropriately conversational, normal mood and affect  Updated Genetic testing: Connective tissue disorders panel ([facility]):   Pertinent New Labs: Rheum work-up  Pertinent New Imaging/Studies: MR Lumbar spine 11/12/2021: IMPRESSION: 1. Right-sided Tarlov cyst at the sacrum. While these are usually incidental in nature and of no clinical significance, these can sometimes be symptomatic. No other significant right-sided findings seen on this exam to explain the patient's right-sided sciatica, raising the possibility that this may be significant. 2. Small disc protrusions at L1-2 through L3-4 as above, but with no significant stenosis or impingement. 3. Mild to moderate bilateral facet hypertrophy at L1-2 through L5-S1, most pronounced at L3-4 on the left and L4-5 on the right. 4. Underlying levoscoliosis.  Assessment: Heather Stewart is a 36 y.o. 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 FBN1 is associated with Marfan syndrome and a spectrum of other conditions. Some individuals have most or all of the features of "classic" Marfan syndrome, while others only have some. Given Heather Stewart's history of ectopia lentis and the FBN1 pathogenic variant, she meets technically meets diagnostic criteria for Marfan syndrome. This likely explains her ectopia lentis, hypermobility, scoliosis, myopia, relatively long arm span,  arachnodactyly, narrow palate and tall stature (relative to parental heights). She lacks some features such as cardiac involvement, pneumothoraces, pectus, facial dysmorphisms. Therefore, rather than classic Marfan syndrome, her phenotype is more of a Marfan-like syndrome or FBN1-related disorder, technically. However, we recommend she follow "classic" Marfan syndrome surveillance and management guidelines out of precaution.  Her specific variant is a deletion in exon 30 creating a premature translational stop signal in the FBN1 gene. While this is not anticipated to result in nonsense mediated decay, it is expected to disrupt the last 41 amino acids of the FBN1 protein.  Features and Management Marfan syndrome is a disorder of the connective tissues that affect the skeletal system, heart, eyes, and occasionally lungs.  Skeletal manifestations: tall and thin body build, scoliosis, pectus excavatum or carinatum; long arms, legs, fingers and toes; and a high, narrow palate (roof of the mouth).   Cardiac: weakness of the valves and/or aortic dilatation and aneurysm. If proper care is not taken, sometimes this can end up with dissection of the aneurysm of the aorta. Without treatment, these cardiac problems can be fatal to patients with Marfan syndrome, although it is varies from  one patient to another.  Management: Follow with cardiology annually with echocardiogram. Consider use of beta blockers in those with dilation. Surgery may be needed.  High impact and competitive sports may pose a cardiac risk. Low impact exercise is encouraged. Activity level should be in accordance with cardiologist's recommendations. Ophthalmologic (Eyes): myopia, lens dislocation, or retinal detachment.  Management: Follow with ophthalmology annually. Pulmonary: spontaneous pneumothorax. It causes chest pain or difficulty breathing. With the correct treatment, the lung can be reinflated.  Management: avoid certain brass  instruments, like trumpet, and avoid sudden changes in air pressure like scuba diving. Neurologic:  Marfan syndrome does not cause learning difficulties or intellectual disability. There are some reports of increased incidence of hyperactivity in individuals with Marfan syndrome. Headaches and low back pain can happen frequently.    Inheritance  Marfan syndrome is inherited as an autosomal dominant condition. This means a pathogenic variant in one copy of the FBN1 gene is sufficient to cause symptoms. The variant can either be inherited from a parent (who would likely have some features of Marfan syndrome) or occur as a new change (de novo) in the individual. Approximately 75% of cases are inherited and 25% are de novo. Genetic testing of Atara's parents can help to determine if other family members may have increased risk of XQJ1-HERDEYC complications. She will discuss testing with her parents and encourage them to reach out to our clinic if interested.  Heather Stewart has a 50% chance of passing the variant on to future biological children. The severity cannot be predicted in an affected child due to the variability of Marfan syndrome symptoms between affected individuals.Testing prior to pregnancy (preimplantation genetic testing), during pregnancy (chorionic villus sampling or amniocentesis), or at birth may be considered if desired. Heather Stewart is planning to undergo egg retrieval this fall for potential future pregnancies.   Pregnancy Pregnancy is not an absolute contraindication in individuals with Marfan syndrome, however there can be significant cardiac complications that may occur during pregnancy. If Heather Stewart were to become pregnant, she should be followed closely and be considered a high-risk pregnancy because of the risk of more rapid dilation of the aorta or aortic dissection during pregnancy, delivery, or in the immediate postpartum period.   Cardiovascular imaging with echocardiography should be performed  every two to three months during pregnancy to monitor aortic root size and growth. Monitoring should continue in the immediate postpartum period because of the increased risk for aortic dissection.  Resources National Marfan Foundation:  The [employer] is a good source of information about Marfan syndrome. Their web address is:  JokeRule.co.uk. A booklet on Marfan syndrome has just been published and is available free of charge.   The FBN1 GeneReviews is an excellent resource for her care providers and can be found online. We also provided Heather Stewart with a printed copy today. RainTreatment.es  Other: We also discussed with Heather Stewart that general surgery is not a definitive contraindication in those with Marfan syndrome. For example, if she elected to have a mastectomy given her PALB2 variant, her having an FBN1 variant would not be a contraindication to this as long as her cardiac status is monitored before, during and after surgery. There is no literature to suggest her skin/connective tissues would not heal from that type of surgery.  Heather Stewart was also found to have variants of uncertain significance in 2 genes. DCHS1 and FOXE3. A variant of uncertain significance means that the clinical significance of that particular gene sequence alteration is not currently known  to either cause symptoms/disease OR be benign/normal variation. This is a common test result. The autosomal dominant conditions associated with these genes manifest as cardiac issues (mitral valve prolapse in DCHS1 and thoracic aortic aneurysm and/or dissection in FOXE3). Heather Stewart will be closely following with Cardiology regardless, so even if these variants were reclassified in the future as pathogenic, she should already be monitored for these complications. Autosomal dominant changes in DCHS1 may also cause anterior segment mesenchymal dysgenesis. There are also autosomal recessive conditions  associated with these genes. At most, Heather Stewart would be a carrier for these conditions and not impacted herself. Carrier screening may be available for any sperm donors in the future if desired.  Recommendations: Cardiology (due) Ophthalmology (due) Parental testing for FBN1 variant  We will inquire about any Adult Cardiology and Ophthalmology providers who specialize in Marfan syndrome as well.   Heidi Dach, MS, [facility] Certified Genetic Counselor  Artist Pais, D.O. Attending Physician Medical Genetics Date: 12/10/2021 Time: 4:09pm  Total time spent: 60 minutes Time spent includes face to face and non-face to face care for the patient on the date of this encounter (history and physical, genetic counseling, coordination of care, data gathering and/or documentation as outlined)

## 2021-12-09 DIAGNOSIS — Q8740 Marfan's syndrome, unspecified: Secondary | ICD-10-CM | POA: Insufficient documentation

## 2021-12-10 ENCOUNTER — Telehealth (INDEPENDENT_AMBULATORY_CARE_PROVIDER_SITE_OTHER): Payer: Self-pay | Admitting: Pediatric Genetics

## 2021-12-10 NOTE — Patient Instructions (Signed)
At [facility], we are committed to providing exceptional care. You will receive a patient satisfaction survey through text or email regarding your visit today. Your opinion is important to me. Comments are appreciated.  

## 2021-12-10 NOTE — Telephone Encounter (Signed)
Returned father's call. Please see his chart in Epic for documentation of our discussion.

## 2021-12-10 NOTE — Telephone Encounter (Signed)
  Name of who is calling:Roger   Caller's Relationship to Patient: father   Best contact number:[phone]  Provider they see: Retta Mac  Reason for call: was told by daughter that father need to be tested - asking for CMA to call back to discuss next steps      PRESCRIPTION REFILL ONLY  Name of prescription:  Pharmacy:

## 2021-12-15 ENCOUNTER — Encounter (INDEPENDENT_AMBULATORY_CARE_PROVIDER_SITE_OTHER): Payer: Self-pay | Admitting: Pediatric Genetics

## 2021-12-17 ENCOUNTER — Other Ambulatory Visit (HOSPITAL_COMMUNITY): Payer: Self-pay

## 2021-12-17 MED ORDER — FLUCONAZOLE 150 MG PO TABS
150.0000 mg | ORAL_TABLET | Freq: Every day | ORAL | 1 refills | Status: DC
  Filled 2021-12-17: qty 2, 2d supply, fill #0

## 2021-12-20 ENCOUNTER — Other Ambulatory Visit (HOSPITAL_COMMUNITY): Payer: Self-pay

## 2021-12-20 ENCOUNTER — Telehealth: Payer: Self-pay | Admitting: Internal Medicine

## 2021-12-20 DIAGNOSIS — M5441 Lumbago with sciatica, right side: Secondary | ICD-10-CM | POA: Diagnosis not present

## 2021-12-20 DIAGNOSIS — M47816 Spondylosis without myelopathy or radiculopathy, lumbar region: Secondary | ICD-10-CM | POA: Diagnosis not present

## 2021-12-20 DIAGNOSIS — M542 Cervicalgia: Secondary | ICD-10-CM | POA: Diagnosis not present

## 2021-12-20 DIAGNOSIS — Z6831 Body mass index (BMI) 31.0-31.9, adult: Secondary | ICD-10-CM | POA: Diagnosis not present

## 2021-12-20 MED ORDER — METHOCARBAMOL 500 MG PO TABS
500.0000 mg | ORAL_TABLET | Freq: Two times a day (BID) | ORAL | 1 refills | Status: DC | PRN
  Filled 2021-12-20: qty 60, 30d supply, fill #0
  Filled 2022-02-20: qty 60, 30d supply, fill #1

## 2021-12-20 NOTE — Telephone Encounter (Signed)
Pt is requesting a provider switch from Dr. Dorris Carnes and Dr. Gasper Sells to Dr. Johney Frame.  Pt states that she has been diagnosed with Marfan Syndrome and was told that Dr. Johney Frame specializes in this and would like to make the switch. She also states she has seen both Dr. Harrington Challenger and Dr. Gasper Sells.

## 2021-12-21 ENCOUNTER — Other Ambulatory Visit (HOSPITAL_COMMUNITY): Payer: Self-pay

## 2021-12-23 NOTE — Telephone Encounter (Signed)
I let the patient know that Dr. Johney Frame stated that she is not an expert in the field, and the other details Dr. Johney Frame stated, and the patient did state she did need to try and find an expert for Marfan, but would call us back if she did not have any luck locating someone more knowledgeable in this area.

## 2022-01-18 DIAGNOSIS — M5416 Radiculopathy, lumbar region: Secondary | ICD-10-CM | POA: Diagnosis not present

## 2022-01-20 ENCOUNTER — Other Ambulatory Visit (HOSPITAL_COMMUNITY): Payer: Self-pay

## 2022-01-20 MED ORDER — WEGOVY 2.4 MG/0.75ML ~~LOC~~ SOAJ
2.4000 mg | SUBCUTANEOUS | 1 refills | Status: DC
  Filled 2022-01-20: qty 3, 28d supply, fill #0
  Filled 2022-02-20: qty 3, 28d supply, fill #1

## 2022-01-24 ENCOUNTER — Encounter (INDEPENDENT_AMBULATORY_CARE_PROVIDER_SITE_OTHER): Payer: Self-pay | Admitting: Pediatric Genetics

## 2022-01-31 ENCOUNTER — Telehealth: Payer: 59 | Admitting: Physician Assistant

## 2022-01-31 DIAGNOSIS — J208 Acute bronchitis due to other specified organisms: Secondary | ICD-10-CM

## 2022-01-31 DIAGNOSIS — B9689 Other specified bacterial agents as the cause of diseases classified elsewhere: Secondary | ICD-10-CM

## 2022-01-31 MED ORDER — AZITHROMYCIN 250 MG PO TABS: ORAL_TABLET | ORAL | 0 refills | Status: AC

## 2022-01-31 MED ORDER — BENZONATATE 100 MG PO CAPS: 100.0000 mg | ORAL_CAPSULE | Freq: Three times a day (TID) | ORAL | 0 refills | Status: DC | PRN

## 2022-01-31 NOTE — Progress Notes (Signed)
We are sorry that you are not feeling well.  Here is how we plan to help!  Based on your presentation I believe you most likely have A cough due to bacteria.  When patients have a fever and a productive cough with a change in color or increased sputum production, we are concerned about bacterial bronchitis.  If left untreated it can progress to pneumonia.  If your symptoms do not improve with your treatment plan it is important that you contact your provider.   I have prescribed Azithromyin 250 mg: two tablets now and then one tablet daily for 4 additonal days    In addition you may use A non-prescription cough medication called Mucinex DM: take 2 tablets every 12 hours. and A prescription cough medication called Tessalon Perles 100mg. You may take 1-2 capsules every 8 hours as needed for your cough.   From your responses in the eVisit questionnaire you describe inflammation in the upper respiratory tract which is causing a significant cough.  This is commonly called Bronchitis and has four common causes:   Allergies Viral Infections Acid Reflux Bacterial Infection Allergies, viruses and acid reflux are treated by controlling symptoms or eliminating the cause. An example might be a cough caused by taking certain blood pressure medications. You stop the cough by changing the medication. Another example might be a cough caused by acid reflux. Controlling the reflux helps control the cough.  USE OF BRONCHODILATOR ("RESCUE") INHALERS: There is a risk from using your bronchodilator too frequently.  The risk is that over-reliance on a medication which only relaxes the muscles surrounding the breathing tubes can reduce the effectiveness of medications prescribed to reduce swelling and congestion of the tubes themselves.  Although you feel brief relief from the bronchodilator inhaler, your asthma may actually be worsening with the tubes becoming more swollen and filled with mucus.  This can delay other  crucial treatments, such as oral steroid medications. If you need to use a bronchodilator inhaler daily, several times per day, you should discuss this with your provider.  There are probably better treatments that could be used to keep your asthma under control.     HOME CARE Only take medications as instructed by your medical team. Complete the entire course of an antibiotic. Drink plenty of fluids and get plenty of rest. Avoid close contacts especially the very young and the elderly Cover your mouth if you cough or cough into your sleeve. Always remember to wash your hands A steam or ultrasonic humidifier can help congestion.   GET HELP RIGHT AWAY IF: You develop worsening fever. You become short of breath You cough up blood. Your symptoms persist after you have completed your treatment plan MAKE SURE YOU  Understand these instructions. Will watch your condition. Will get help right away if you are not doing well or get worse.    Thank you for choosing an e-visit.  Your e-visit answers were reviewed by a board certified advanced clinical practitioner to complete your personal care plan. Depending upon the condition, your plan could have included both over the counter or prescription medications.  Please review your pharmacy choice. Make sure the pharmacy is open so you can pick up prescription now. If there is a problem, you may contact your provider through MyChart messaging and have the prescription routed to another pharmacy.  Your safety is important to us. If you have drug allergies check your prescription carefully.   For the next 24 hours you can use   to ask questions about today's visit, request a non-urgent call back, or ask for a work or school excuse. You will get an email in the next two days asking about your experience. I hope that your e-visit has been valuable and will speed your recovery.  I provided 5 minutes of non face-to-face time during this encounter for chart  review and documentation.

## 2022-02-15 ENCOUNTER — Other Ambulatory Visit (HOSPITAL_COMMUNITY): Payer: Self-pay

## 2022-02-17 ENCOUNTER — Other Ambulatory Visit (HOSPITAL_COMMUNITY): Payer: Self-pay

## 2022-02-20 ENCOUNTER — Other Ambulatory Visit (HOSPITAL_COMMUNITY): Payer: Self-pay

## 2022-02-21 ENCOUNTER — Other Ambulatory Visit (HOSPITAL_COMMUNITY): Payer: Self-pay

## 2022-02-22 ENCOUNTER — Other Ambulatory Visit (HOSPITAL_COMMUNITY): Payer: Self-pay

## 2022-02-22 DIAGNOSIS — N760 Acute vaginitis: Secondary | ICD-10-CM | POA: Diagnosis not present

## 2022-02-22 DIAGNOSIS — N939 Abnormal uterine and vaginal bleeding, unspecified: Secondary | ICD-10-CM | POA: Diagnosis not present

## 2022-02-22 DIAGNOSIS — Z124 Encounter for screening for malignant neoplasm of cervix: Secondary | ICD-10-CM | POA: Diagnosis not present

## 2022-02-22 DIAGNOSIS — N83209 Unspecified ovarian cyst, unspecified side: Secondary | ICD-10-CM | POA: Diagnosis not present

## 2022-02-22 DIAGNOSIS — Z01419 Encounter for gynecological examination (general) (routine) without abnormal findings: Secondary | ICD-10-CM | POA: Diagnosis not present

## 2022-02-22 DIAGNOSIS — Z6831 Body mass index (BMI) 31.0-31.9, adult: Secondary | ICD-10-CM | POA: Diagnosis not present

## 2022-02-22 DIAGNOSIS — Z1151 Encounter for screening for human papillomavirus (HPV): Secondary | ICD-10-CM | POA: Diagnosis not present

## 2022-02-22 LAB — RESULTS CONSOLE HPV: CHL HPV: NEGATIVE

## 2022-02-22 LAB — HM PAP SMEAR: HM Pap smear: NEGATIVE

## 2022-02-22 MED ORDER — AMPHETAMINE-DEXTROAMPHET ER 20 MG PO CP24
20.0000 mg | ORAL_CAPSULE | Freq: Every morning | ORAL | 0 refills | Status: DC
  Filled 2022-02-22: qty 30, 30d supply, fill #0

## 2022-02-22 MED ORDER — PODOFILOX 0.5 % EX SOLN
CUTANEOUS | 1 refills | Status: DC
  Filled 2022-02-22: qty 7, 30d supply, fill #0

## 2022-02-22 MED ORDER — QUETIAPINE FUMARATE 50 MG PO TABS
50.0000 mg | ORAL_TABLET | Freq: Every day | ORAL | 0 refills | Status: DC
  Filled 2022-02-22: qty 180, 90d supply, fill #0

## 2022-02-23 NOTE — Progress Notes (Deleted)
Cardiology Office Note:    Date:  02/23/2022   ID:  Heather Stewart, DOB 10/31/1985, MRN [id number]  PCP:  Ria Bush, [place] Providers Cardiologist:  None {  Referring MD: Ria Bush, MD    History of Present Illness:    Heather Stewart is a 36 y.o. female with a hx of marfans disease and hypertriglyceridemia who was previously followed by Dr. Gasper Sells who now presents to clinic for follow-up.  Patient was last seen by Dr. Gasper Sells on 07/13/20 where she had been having episodes of syncope and palpitations. Zio monitor 07/2020 with one run of nonsustained VT lasting 4 beats, 2 runs of SVT longest lasting 5 beats, rare isolated PACs and PVCs. ETT 07/2020 normal. TTE 07/2020 with LVEF 60-65%, normal RV, no significant valve disease.  Today, ***  Past Medical History:  Diagnosis Date   Acne    ADHD (attention deficit hyperactivity disorder), combined type    dx as child and then received through psychiatry - reviewed records from psych: no recent w/u.  was on vyvanse during nursing school   Adjustment disorder    Anxiety    Chronic back pain    thoracic and lumbar - s/p unrevealing NSG eval    Connective tissue disease ([place])    ocular lens dislocation, flat feet, high palate, no cardiac involvement   Esophageal reflux    Eye problems 08/2011   subluxated lenses OU, rec test for homocyetinuria to r/o as cause (see scanned form)   Focal nodular hyperplasia of liver 11/10/2018   By MRI at [facility] 07/2018 - see scanned report   Hx of migraines    Irritable bowel syndrome    MDD (major depressive disorder), recurrent episode ([place])    Pneumonia    Screening examination for pulmonary tuberculosis    Swelling, mass, or lump in head and neck    Uvulitis 10/02/2018    Past Surgical History:  Procedure Laterality Date   BREAST LUMPECTOMY Right 03/2018   CHOLECYSTECTOMY N/A 03/16/2021   Procedure: LAPAROSCOPIC  CHOLECYSTECTOMY WITH ICG DYE;  Surgeon: Rolm Bookbinder, MD;  Location: [facility];  Service: General;  Laterality: N/A;   ESOPHAGOGASTRODUODENOSCOPY  07/2018   gastric polyps biopsied, mild acute ?alcoholic gastritis (Dr Jabier Mutton in [place])   [place] removed in [place], [place]     Left wrist   MYRINGOTOMY  multiple   had tissue from behind ears implanted into bilateral TMs to close (1990s)   [place] Left 2018   2 tension rings placed, lens replaced Isaiah Blakes) [place] [facility]    Current Medications: No outpatient medications have been marked as taking for the 02/25/22 encounter (Appointment) with Freada Bergeron, MD.     Allergies:   Amoxicillin-pot clavulanate, Neosporin [neomycin-bacitracin zn-polymyx], Abilify [aripiprazole], Bupropion, Doxycycline, and Vyvanse [lisdexamfetamine dimesylate]   Social History   Socioeconomic History   Marital status: Single    Spouse name: Not on file   Number of children: 0   Years of education: Not on file   Highest education level: Not on file  Occupational History   Occupation: [employer]    Employer: [employer]   Occupation: [place] [employer]  Tobacco Use   Smoking status: Every Day    Types: Cigarettes, E-cigarettes   Smokeless tobacco: Never  Vaping Use   Vaping  Use: Every day  Substance and Sexual Activity   Alcohol use: Yes    Comment: occ wine   Drug use: No   Sexual activity: Yes    Birth control/protection: None  Other Topics Concern   Not on file  Social History Narrative   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, 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, 1 dog   Social Determinants of Health   Financial Resource Strain: Not on file  Food Insecurity: Not on file  Transportation Needs: Not on file  Physical Activity: Not on file  Stress: Not on file  Social Connections: Not on file      Family History: The patient's ***family history includes Alcohol abuse in her father; Breast cancer in her maternal aunt, maternal grandmother, and another family member; COPD in her paternal grandfather; Cancer in her brother; Coronary artery disease in an other family member; Heart disease in an other family member; Kidney disease in an other family member; Liver cancer in her brother; Migraines in her mother; Other in her father.  ROS:   Please see the history of present illness.    *** All other systems reviewed and are negative.  EKGs/Labs/Other Studies Reviewed:    The following studies were reviewed today: Zio Monitor 07/2020: Patient had a minimum heart rate of 49 bpm, maximum heart rate of 175 bpm, and average heart rate of 87 bpm. Predominant underlying rhythm was sinus rhythm. One run of nonsustained ventricular tachycardia occurred lasting 4 beats at longest with a max rate of 162 bpm at fastest. Two runs of supreventricular tachycardia occurred lasting 5 beats at longest with a max rate of 169 bpm at fastest. Isolated PACs were rare (<1.0%), with rare couplets. Isolated PVCs were rare (<1.0%), with rare bigeminy. No evidence of complete heart block. Triggered and diary events associated with sinus rhythm, sinus bradycardia,and sinus tachycardia.  ETT 07/2020: Blood pressure demonstrated a normal response to exercise. There was no ST segment deviation noted during stress. No T wave inversion was noted during stress. Negative, adequate stress test.   TTE 07/2020: IMPRESSIONS     1. Left ventricular ejection fraction, by estimation, is 60 to 65%. Left  ventricular ejection fraction by 3D volume is 62 %. The left ventricle has  normal function. The left ventricle has no regional wall motion  abnormalities. Left ventricular diastolic   parameters were normal.   2. Right ventricular systolic function is normal. The right ventricular  size is normal.   3. The mitral  valve is normal in structure. No evidence of mitral valve  regurgitation.   4. The aortic valve is normal in structure. Aortic valve regurgitation is  trivial.  EKG:  EKG is *** ordered today.  The ekg ordered today demonstrates ***  Recent Labs: 09/27/2021: ALT 10; BUN 13; Creatinine, Ser 0.77; Hemoglobin 14.8; Platelets 275.0; Potassium 4.5; Sodium 139; TSH 2.31  Recent Lipid Panel    Component Value Date/Time   CHOL 202 (H) 09/27/2021 0942   TRIG 179.0 (H) 09/27/2021 0942   HDL 37.00 (L) 09/27/2021 0942   CHOLHDL 5 09/27/2021 0942   VLDL 35.8 09/27/2021 0942   LDLCALC 129 (H) 09/27/2021 0942   LDLDIRECT 129.0 10/31/2018 1019     Risk Assessment/Calculations:   {Does this patient have ATRIAL FIBRILLATION?:[id number]}  No BP recorded.  {Refresh Note OR Click here to enter BP  :1}***         Physical Exam:    VS:  There were no vitals taken  for this visit.    Wt Readings from Last 3 Encounters:  12/07/21 215 lb 6.4 oz (97.7 kg)  10/14/21 215 lb 9.6 oz (97.8 kg)  09/27/21 219 lb 2 oz (99.4 kg)     GEN: *** Well nourished, well developed in no acute distress HEENT: Normal NECK: No JVD; No carotid bruits LYMPHATICS: No lymphadenopathy CARDIAC: ***RRR, no murmurs, rubs, gallops RESPIRATORY:  Clear to auscultation without rales, wheezing or rhonchi  ABDOMEN: Soft, non-tender, non-distended MUSCULOSKELETAL:  No edema; No deformity  SKIN: Warm and dry NEUROLOGIC:  Alert and oriented x 3 PSYCHIATRIC:  Normal affect   ASSESSMENT:    No diagnosis found. PLAN:    In order of problems listed above:  #Marfan Syndrome: TTE 07/2020 with normal BiV function and no significant valve disease. No aortic root dilation noted. ETT with no significant ectopy and cardiac monitor without significant arrhythmias.  -Check MRA/CTA aorta -???BB therapy      {Are you ordering a CV Procedure (e.g. stress test, cath, DCCV, TEE, etc)?   Press F2        :[id number]}    Medication  Adjustments/Labs and Tests Ordered: Current medicines are reviewed at length with the patient today.  Concerns regarding medicines are outlined above.  No orders of the defined types were placed in this encounter.  No orders of the defined types were placed in this encounter.   There are no Patient Instructions on file for this visit.   Signed, Freada Bergeron, MD  02/23/2022 3:15 PM    [place]

## 2022-02-25 ENCOUNTER — Ambulatory Visit: Payer: 59 | Admitting: Cardiology

## 2022-02-28 ENCOUNTER — Other Ambulatory Visit (HOSPITAL_COMMUNITY): Payer: Self-pay

## 2022-02-28 DIAGNOSIS — M542 Cervicalgia: Secondary | ICD-10-CM | POA: Diagnosis not present

## 2022-02-28 DIAGNOSIS — M5416 Radiculopathy, lumbar region: Secondary | ICD-10-CM | POA: Diagnosis not present

## 2022-02-28 DIAGNOSIS — M546 Pain in thoracic spine: Secondary | ICD-10-CM | POA: Diagnosis not present

## 2022-02-28 MED ORDER — MELOXICAM 15 MG PO TABS
15.0000 mg | ORAL_TABLET | Freq: Every day | ORAL | 2 refills | Status: DC
  Filled 2022-02-28 – 2022-04-07 (×3): qty 30, 30d supply, fill #0

## 2022-03-07 ENCOUNTER — Other Ambulatory Visit (HOSPITAL_COMMUNITY): Payer: Self-pay

## 2022-03-07 DIAGNOSIS — F900 Attention-deficit hyperactivity disorder, predominantly inattentive type: Secondary | ICD-10-CM | POA: Diagnosis not present

## 2022-03-07 DIAGNOSIS — F32A Depression, unspecified: Secondary | ICD-10-CM | POA: Diagnosis not present

## 2022-03-07 DIAGNOSIS — F411 Generalized anxiety disorder: Secondary | ICD-10-CM | POA: Diagnosis not present

## 2022-03-07 MED ORDER — AMPHETAMINE-DEXTROAMPHET ER 20 MG PO CP24: 20.0000 mg | ORAL_CAPSULE | Freq: Every morning | ORAL | 0 refills | Status: DC

## 2022-03-07 MED ORDER — AMPHETAMINE-DEXTROAMPHET ER 20 MG PO CP24
20.0000 mg | ORAL_CAPSULE | Freq: Every morning | ORAL | 0 refills | Status: DC
  Filled 2022-03-21 – 2022-03-22 (×2): qty 30, 30d supply, fill #0

## 2022-03-07 MED ORDER — AMPHETAMINE-DEXTROAMPHET ER 20 MG PO CP24
20.0000 mg | ORAL_CAPSULE | Freq: Every morning | ORAL | 0 refills | Status: DC
  Filled 2022-04-18 – 2022-04-19 (×2): qty 30, 30d supply, fill #0

## 2022-03-07 MED ORDER — DULOXETINE HCL 60 MG PO CPEP
60.0000 mg | ORAL_CAPSULE | Freq: Every day | ORAL | 1 refills | Status: DC
  Filled 2022-03-07 – 2022-05-11 (×3): qty 90, 90d supply, fill #0
  Filled 2022-07-25: qty 90, 90d supply, fill #1

## 2022-03-07 MED ORDER — QUETIAPINE FUMARATE 50 MG PO TABS
50.0000 mg | ORAL_TABLET | Freq: Every day | ORAL | 1 refills | Status: DC
  Filled 2022-03-07 – 2022-05-11 (×4): qty 180, 90d supply, fill #0
  Filled 2022-07-25 – 2022-08-17 (×2): qty 180, 90d supply, fill #1

## 2022-03-08 ENCOUNTER — Other Ambulatory Visit (HOSPITAL_COMMUNITY): Payer: Self-pay

## 2022-03-09 ENCOUNTER — Ambulatory Visit: Payer: 59 | Admitting: Cardiology

## 2022-03-21 ENCOUNTER — Other Ambulatory Visit (HOSPITAL_COMMUNITY): Payer: Self-pay

## 2022-03-21 MED ORDER — WEGOVY 2.4 MG/0.75ML ~~LOC~~ SOAJ
2.4000 mg | SUBCUTANEOUS | 1 refills | Status: DC
  Filled 2022-03-21: qty 3, 28d supply, fill #0
  Filled 2022-04-18: qty 3, 28d supply, fill #1

## 2022-03-22 ENCOUNTER — Other Ambulatory Visit (HOSPITAL_COMMUNITY): Payer: Self-pay

## 2022-03-25 NOTE — Progress Notes (Unsigned)
Cardiology Office Note:    Date:  03/25/2022   ID:  Heather Stewart, DOB April 02, 1986, MRN [id number]  PCP:  Ria Bush, [place] Providers Cardiologist:  None {  Referring MD: Ria Bush, MD    History of Present Illness:    Heather Stewart is a 36 y.o. female with a hx of marfans disease and hypertriglyceridemia who was previously followed by Dr. Gasper Sells who now presents to clinic for follow-up.  Patient was last seen by Dr. Gasper Sells on 07/13/20 where she had been having episodes of syncope and palpitations. Zio monitor 07/2020 with one run of nonsustained VT lasting 4 beats, 2 runs of SVT longest lasting 5 beats, rare isolated PACs and PVCs. ETT 07/2020 normal. TTE 07/2020 with LVEF 60-65%, normal RV, no significant valve disease.  Today, ***  Past Medical History:  Diagnosis Date   Acne    ADHD (attention deficit hyperactivity disorder), combined type    dx as child and then received through psychiatry - reviewed records from psych: no recent w/u.  was on vyvanse during nursing school   Adjustment disorder    Anxiety    Chronic back pain    thoracic and lumbar - s/p unrevealing NSG eval    Connective tissue disease ([place])    ocular lens dislocation, flat feet, high palate, no cardiac involvement   Esophageal reflux    Eye problems 08/2011   subluxated lenses OU, rec test for homocyetinuria to r/o as cause (see scanned form)   Focal nodular hyperplasia of liver 11/10/2018   [facility] at [facility] 07/2018 - see scanned report   Hx of migraines    Irritable bowel syndrome    MDD (major depressive disorder), recurrent episode ([place])    Pneumonia    Screening examination for pulmonary tuberculosis    Swelling, mass, or lump in head and neck    Uvulitis 10/02/2018    Past Surgical History:  Procedure Laterality Date   BREAST LUMPECTOMY Right 03/2018   CHOLECYSTECTOMY N/A 03/16/2021   Procedure: LAPAROSCOPIC  CHOLECYSTECTOMY WITH ICG DYE;  Surgeon: Rolm Bookbinder, MD;  Location: [facility];  Service: General;  Laterality: N/A;   ESOPHAGOGASTRODUODENOSCOPY  07/2018   gastric polyps biopsied, mild acute ?alcoholic gastritis (Dr Jabier Mutton in [place])   [place] removed in [place], [place]     Left wrist   MYRINGOTOMY  multiple   had tissue from behind ears implanted into bilateral TMs to close (1990s)   [place] Left 2018   2 tension rings placed, lens replaced Isaiah Blakes) [facility]    Current Medications: No outpatient medications have been marked as taking for the 03/29/22 encounter (Appointment) with Freada Bergeron, MD.     Allergies:   Amoxicillin-pot clavulanate, Neosporin [neomycin-bacitracin zn-polymyx], Abilify [aripiprazole], Bupropion, Doxycycline, and Vyvanse [lisdexamfetamine dimesylate]   Social History   Socioeconomic History   Marital status: Single    Spouse name: Not on file   Number of children: 0   Years of education: Not on file   Highest education level: Not on file  Occupational History   Occupation: [employer] [employer]    Employer: [employer]   Occupation: [place] [employer]  Tobacco Use   Smoking status: Every Day    Types: Cigarettes, E-cigarettes   Smokeless tobacco: Never  Vaping Use   Vaping  Use: Every day  Substance and Sexual Activity   Alcohol use: Yes    Comment: occ wine   Drug use: No   Sexual activity: Yes    Birth control/protection: None  Other Topics Concern   Not on file  Social History Narrative   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, 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, 1 dog   Social Determinants of Health   Financial Resource Strain: Not on file  Food Insecurity: Not on file  Transportation Needs: Not on file  Physical Activity: Not on file  Stress: Not on file  Social Connections: Not on file      Family History: The patient's ***family history includes Alcohol abuse in her father; Breast cancer in her maternal aunt, maternal grandmother, and another family member; COPD in her paternal grandfather; Cancer in her brother; Coronary artery disease in an other family member; Heart disease in an other family member; Kidney disease in an other family member; Liver cancer in her brother; Migraines in her mother; Other in her father.  ROS:   Please see the history of present illness.    *** All other systems reviewed and are negative.  EKGs/Labs/Other Studies Reviewed:    The following studies were reviewed today: Zio Monitor 07/2020: Patient had a minimum heart rate of 49 bpm, maximum heart rate of 175 bpm, and average heart rate of 87 bpm. Predominant underlying rhythm was sinus rhythm. One run of nonsustained ventricular tachycardia occurred lasting 4 beats at longest with a max rate of 162 bpm at fastest. Two runs of supreventricular tachycardia occurred lasting 5 beats at longest with a max rate of 169 bpm at fastest. Isolated PACs were rare (<1.0%), with rare couplets. Isolated PVCs were rare (<1.0%), with rare bigeminy. No evidence of complete heart block. Triggered and diary events associated with sinus rhythm, sinus bradycardia,and sinus tachycardia.  ETT 07/2020: Blood pressure demonstrated a normal response to exercise. There was no ST segment deviation noted during stress. No T wave inversion was noted during stress. Negative, adequate stress test.   TTE 07/2020: IMPRESSIONS     1. Left ventricular ejection fraction, by estimation, is 60 to 65%. Left  ventricular ejection fraction by 3D volume is 62 %. The left ventricle has  normal function. The left ventricle has no regional wall motion  abnormalities. Left ventricular diastolic   parameters were normal.   2. Right ventricular systolic function is normal. The right ventricular  size is normal.   3. The mitral  valve is normal in structure. No evidence of mitral valve  regurgitation.   4. The aortic valve is normal in structure. Aortic valve regurgitation is  trivial.  EKG:  EKG is *** ordered today.  The ekg ordered today demonstrates ***  Recent Labs: 09/27/2021: ALT 10; BUN 13; Creatinine, Ser 0.77; Hemoglobin 14.8; Platelets 275.0; Potassium 4.5; Sodium 139; TSH 2.31  Recent Lipid Panel    Component Value Date/Time   CHOL 202 (H) 09/27/2021 0942   TRIG 179.0 (H) 09/27/2021 0942   HDL 37.00 (L) 09/27/2021 0942   CHOLHDL 5 09/27/2021 0942   VLDL 35.8 09/27/2021 0942   LDLCALC 129 (H) 09/27/2021 0942   LDLDIRECT 129.0 10/31/2018 1019     Risk Assessment/Calculations:   {Does this patient have ATRIAL FIBRILLATION?:[id number]}  No BP recorded.  {Refresh Note OR Click here to enter BP  :1}***         Physical Exam:    VS:  There were no vitals taken  for this visit.    Wt Readings from Last 3 Encounters:  12/07/21 215 lb 6.4 oz (97.7 kg)  10/14/21 215 lb 9.6 oz (97.8 kg)  09/27/21 219 lb 2 oz (99.4 kg)     GEN: *** Well nourished, well developed in no acute distress HEENT: Normal NECK: No JVD; No carotid bruits LYMPHATICS: No lymphadenopathy CARDIAC: ***RRR, no murmurs, rubs, gallops RESPIRATORY:  Clear to auscultation without rales, wheezing or rhonchi  ABDOMEN: Soft, non-tender, non-distended MUSCULOSKELETAL:  No edema; No deformity  SKIN: Warm and dry NEUROLOGIC:  Alert and oriented x 3 PSYCHIATRIC:  Normal affect   ASSESSMENT:    No diagnosis found. PLAN:    In order of problems listed above:  #Marfan Syndrome: TTE 07/2020 with normal BiV function and no significant valve disease. No aortic root dilation noted. ETT with no significant ectopy and cardiac monitor without significant arrhythmias. Will check MRA aorta. Blood pressure is currently *** -Check MRA/CTA aorta -???BB therapy      {Are you ordering a CV Procedure (e.g. stress test, cath, DCCV, TEE, etc)?    Press F2        :[id number]}    Medication Adjustments/Labs and Tests Ordered: Current medicines are reviewed at length with the patient today.  Concerns regarding medicines are outlined above.  No orders of the defined types were placed in this encounter.  No orders of the defined types were placed in this encounter.   There are no Patient Instructions on file for this visit.   Signed, Freada Bergeron, MD  03/25/2022 8:42 PM    [place]

## 2022-03-29 ENCOUNTER — Encounter: Payer: Self-pay | Admitting: Cardiology

## 2022-03-29 ENCOUNTER — Ambulatory Visit: Payer: 59 | Attending: Cardiology | Admitting: Cardiology

## 2022-03-29 ENCOUNTER — Other Ambulatory Visit: Payer: 59

## 2022-03-29 VITALS — BP 102/70 | HR 81 | Ht 69.0 in | Wt 212.8 lb

## 2022-03-29 DIAGNOSIS — R2991 Unspecified symptoms and signs involving the musculoskeletal system: Secondary | ICD-10-CM | POA: Diagnosis not present

## 2022-03-29 DIAGNOSIS — Q8740 Marfan's syndrome, unspecified: Secondary | ICD-10-CM | POA: Diagnosis not present

## 2022-03-29 LAB — BASIC METABOLIC PANEL
BUN/Creatinine Ratio: 20 (ref 9–23)
BUN: 15 mg/dL (ref 6–20)
CO2: 26 mmol/L (ref 20–29)
Calcium: 9.7 mg/dL (ref 8.7–10.2)
Chloride: 102 mmol/L (ref 96–106)
Creatinine, Ser: 0.75 mg/dL (ref 0.57–1.00)
Glucose: 95 mg/dL (ref 70–99)
Potassium: 4.6 mmol/L (ref 3.5–5.2)
Sodium: 136 mmol/L (ref 134–144)
eGFR: 106 mL/min/{1.73_m2} (ref >59)

## 2022-03-29 NOTE — Patient Instructions (Signed)
Medication Instructions:   Your physician recommends that you continue on your current medications as directed. Please refer to the Current Medication list given to you today.  *If you need a refill on your cardiac medications before your next appointment, please call your pharmacy*   Testing/Procedures:  1.)  Your physician has requested that you have an echocardiogram. Echocardiography is a painless test that uses sound waves to create images of your heart. It provides your doctor with information about the size and shape of your heart and how well your heart's chambers and valves are working. This procedure takes approximately one hour. There are no restrictions for this procedure. Please do NOT wear cologne, perfume, aftershave, or lotions (deodorant is allowed). Please arrive 15 minutes prior to your appointment time.   2.)  CT ANGIO CHEST AORTA WITH/WITHOUT CONTRAST   3.) Follow-Up: At [facility], you and your health needs are our priority.  As part of our continuing mission to provide you with exceptional heart care, we have created designated Provider Care Teams.  These Care Teams include your primary Cardiologist (physician) and Advanced Practice Providers (APPs -  Physician Assistants and Nurse Practitioners) who all work together to provide you with the care you need, when you need it.  We recommend signing up for the patient portal called "MyChart".  Sign up information is provided on this After Visit Summary.  MyChart is used to connect with patients for Virtual Visits (Telemedicine).  Patients are able to view lab/test results, encounter notes, upcoming appointments, etc.  Non-urgent messages can be sent to your provider as well.   To learn more about what you can do with MyChart, go to [email or web address].    Your next appointment:   6 month(s)  The format for your next appointment:   In Person  Provider:   DR. Johney Frame   Important Information About  [place]

## 2022-03-29 NOTE — Progress Notes (Signed)
Cardiology Office Note:    Date:  03/29/2022   ID:  Heather Stewart, DOB 01/06/1986, MRN [id number]  PCP:  Ria Bush, [place] Providers Cardiologist:  None {  Referring MD: Ria Bush, MD    History of Present Illness:    Heather Stewart is a 36 y.o. female with a hx of marfans disease and hypertriglyceridemia who was previously followed by Dr. Gasper Stewart who now presents to clinic for follow-up.  Patient was last seen by Dr. Gasper Stewart on 07/13/20 where she had been having episodes of syncope and palpitations. Zio monitor 07/2020 with one run of nonsustained VT lasting 4 beats, 2 runs of SVT longest lasting 5 beats, rare isolated PACs and PVCs. ETT 07/2020 normal. TTE 07/2020 with LVEF 60-65%, normal RV, no significant valve disease.  Today, the patient states that she is overall feeling okay. Had a recent viral illness and has had a cough for 3 weeks. No fevers or chills. Flu and COVID negative.   Her history of her marfan's disease diagnosis started in freshman year of highschool where she had gone to the optometrist who discovered that she had ectopic lentis. She was followed by a pediatric Cardiologist initially and then transferred care to Dr. Harrington Stewart. Ultimately had genetic testing and was positive for FBN1 (c.8491del (p.Ile2831Serfs*15)) as well as variants of uncertain significance in two genes: DCHS1 (c.8624G>A (p.Arg2875Gln)) and FOXE3 (c.880G>A (p.Ala294Thr). She is followed by Dr. Retta Stewart.  From a CV perspective, she states she has been having chest pain that radiates into the back or shoulder, she doesn't know if this is due to anxiety at work or musculoskeletal problems. She hasn't had routine aortic imaging such as CT scans since 2009. No known history of aortic dilation or dissection. No family history of Marfan syndrome (she had a mutation in utero as both parents are negative) or aortic pathology.  She states that she has episodes of  tachycardia from 120 to 130. She is able to tell that her heart rate is high. When her heart rate gets closer to 140 during these episodes she starts getting symptoms such as shortness of breath and lightheadedness. This has been ongoing for years.  She is hoping to potentially become pregnant in the future and wondering if this is safe with her Marfan syndrome.  She has been vaping for about 15 months after quitting drinking. She is working on quitting.  She denies any peripheral edema, headaches, syncope, orthopnea, or PND.  Past Medical History:  Diagnosis Date   Acne    ADHD (attention deficit hyperactivity disorder), combined type    dx as child and then received through psychiatry - reviewed records from psych: no recent w/u.  was on vyvanse during nursing school   Adjustment disorder    Anxiety    Chronic back pain    thoracic and lumbar - s/p unrevealing NSG eval    Connective tissue disease ([place])    ocular lens dislocation, flat feet, high palate, no cardiac involvement   Esophageal reflux    Eye problems 08/2011   subluxated lenses OU, rec test for homocyetinuria to r/o as cause (see scanned form)   Focal nodular hyperplasia of liver 11/10/2018   By MRI at [facility] 07/2018 - see scanned report   Hx of migraines    Irritable bowel syndrome    MDD (major depressive disorder), recurrent episode ([place])    Pneumonia    Screening examination for pulmonary tuberculosis  Swelling, mass, or lump in head and neck    Uvulitis 10/02/2018    Past Surgical History:  Procedure Laterality Date   BREAST LUMPECTOMY Right 03/2018   CHOLECYSTECTOMY N/A 03/16/2021   Procedure: LAPAROSCOPIC CHOLECYSTECTOMY WITH ICG DYE;  Surgeon: Rolm Bookbinder, MD;  Location: [facility];  Service: General;  Laterality: N/A;   ESOPHAGOGASTRODUODENOSCOPY  07/2018   gastric polyps biopsied, mild acute ?alcoholic gastritis (Dr Heather Stewart in [place])   [place] removed  in [place], [place]     Left wrist   MYRINGOTOMY  multiple   had tissue from behind ears implanted into bilateral TMs to close (1990s)   REMOVE AND REPLACE LENS Left 2018   2 tension rings placed, lens replaced Heather Stewart) [place] [facility]    Current Medications: Current Meds  Medication Sig   amphetamine-dextroamphetamine (ADDERALL XR) 20 MG 24 hr capsule Take 1 capsule (20 mg total) by mouth every morning.   amphetamine-dextroamphetamine (ADDERALL XR) 20 MG 24 hr capsule Take 1 capsule (20 mg total) by mouth every morning.   [START ON 04/05/2022] amphetamine-dextroamphetamine (ADDERALL XR) 20 MG 24 hr capsule Take 1 capsule (20 mg total) by mouth in the morning.   [START ON 05/03/2022] amphetamine-dextroamphetamine (ADDERALL XR) 20 MG 24 hr capsule Take 1 capsule (20 mg total) by mouth in the morning.   amphetamine-dextroamphetamine (ADDERALL XR) 20 MG 24 hr capsule Take 1 capsule (20 mg total) by mouth in the morning.   DULoxetine (CYMBALTA) 60 MG capsule Take 1 capsule (60 mg total) by mouth daily.   meloxicam (MOBIC) 15 MG tablet Take 1 tablet (15 mg total) by mouth daily.   methocarbamol (ROBAXIN) 500 MG tablet Take 1 tablet (500 mg total) by mouth 2 (two) times daily as needed for spasm.   ondansetron (ZOFRAN) 4 MG tablet Take 1 tablet (4 mg total) by mouth as needed.   pantoprazole (PROTONIX) 40 MG tablet Take 1 tablet (40 mg total) by mouth daily.   podofilox (CONDYLOX) 0.5 % external solution Apply topically 2 times a day for 3 days then stop for 4 days, repeat 7 day cycle until no visible wart tissue/max 4 cycles   QUEtiapine (SEROQUEL) 50 MG tablet Take 1-2 tablets (50-100 mg total) by mouth at bedtime.   Semaglutide-Weight Management (WEGOVY) 2.4 MG/0.75ML SOAJ Inject 2.4 mg into the skin once a week.   spironolactone (ALDACTONE) 50 MG tablet Take 1 tablet (50 mg total) by mouth 2 (two) times daily.     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  Tobacco Use   Smoking status: Every Day    Types: Cigarettes, E-cigarettes   Smokeless tobacco: Never  Vaping Use   Vaping Use: Every day  Substance and Sexual Activity   Alcohol use: Yes    Comment: occ wine   Drug use: No   Sexual activity: Yes    Birth control/protection: None  Other Topics Concern   Not on file  Social History  Narrative   Single No childrenCaffeine: 1 coke occasionallyWorks at [employer] alone, 1 dog   Social Determinants of Health   Financial Resource Strain: Not on file  Food Insecurity: Not on file  Transportation Needs: Not on file  Physical Activity: Not on file  Stress: Not on file  Social Connections: Not on file     Family History: The patient's family history includes Alcohol abuse in her father; Breast cancer in her maternal aunt, maternal grandmother, and another family member; COPD in her paternal grandfather; Cancer in her brother; Coronary artery disease in an other family member; Heart disease in an other family member; Kidney disease in an other family member; Liver cancer in her brother; Migraines in her mother; Other in her father.  ROS:   Please see the history of present illness.    Review of Systems  Constitutional:  Negative for chills and fever.  HENT:  Negative for nosebleeds and tinnitus.   Eyes:  Negative for blurred vision and pain.  Respiratory:  Positive for cough and shortness of breath. Negative for hemoptysis and stridor.    Cardiovascular:  Positive for chest pain and palpitations. Negative for orthopnea, claudication, leg swelling and PND.  Gastrointestinal:  Negative for blood in stool, diarrhea, nausea and vomiting.  Genitourinary:  Negative for dysuria and hematuria.  Musculoskeletal:  Positive for back pain and joint pain. Negative for falls.  Neurological:  Positive for dizziness (lightheadedness during tachycardic events). Negative for loss of consciousness and headaches.  Psychiatric/Behavioral:  Negative for depression, hallucinations and substance abuse. The patient does not have insomnia.      All other systems reviewed and are negative.  EKGs/Labs/Other Studies Reviewed:    The following studies were reviewed today: Zio Monitor 07/2020: Patient had a minimum heart rate of 49 bpm, maximum heart rate of 175 bpm, and average heart rate of 87 bpm. Predominant underlying rhythm was sinus rhythm. One run of nonsustained ventricular tachycardia occurred lasting 4 beats at longest with a max rate of 162 bpm at fastest. Two runs of supreventricular tachycardia occurred lasting 5 beats at longest with a max rate of 169 bpm at fastest. Isolated PACs were rare (<1.0%), with rare couplets. Isolated PVCs were rare (<1.0%), with rare bigeminy. No evidence of complete heart block. Triggered and diary events associated with sinus rhythm, sinus bradycardia,and sinus tachycardia.  ETT 07/2020: Blood pressure demonstrated a normal response to exercise. There was no ST segment deviation noted during stress. No T wave inversion was noted during stress. Negative, adequate stress test.   TTE 07/2020: IMPRESSIONS     1. Left ventricular ejection fraction, by estimation, is 60 to 65%. Left  ventricular ejection fraction by 3D volume is 62 %. The left ventricle has  normal function. The left ventricle has no regional wall motion  abnormalities. Left ventricular diastolic   parameters were normal.   2. Right  ventricular systolic function is normal. The right ventricular  size is normal.   3. The mitral valve is normal in structure. No evidence of mitral valve  regurgitation.   4. The aortic valve is normal in structure. Aortic valve regurgitation is  trivial.  EKG:  The EKG is personally reviewed 03/29/2022: NSR 81 bpm    Recent Labs: 09/27/2021: ALT 10; BUN 13; Creatinine, Ser 0.77; Hemoglobin 14.8; Platelets 275.0; Potassium 4.5; Sodium 139; TSH 2.31  Recent Lipid Panel    Component Value Date/Time   CHOL 202 (H) 09/27/2021 0942   TRIG 179.0 (  H) 09/27/2021 0942   HDL 37.00 (L) 09/27/2021 0942   CHOLHDL 5 09/27/2021 0942   VLDL 35.8 09/27/2021 0942   LDLCALC 129 (H) 09/27/2021 0942   LDLDIRECT 129.0 10/31/2018 1019     Risk Assessment/Calculations:                Physical Exam:    VS:  BP 102/70   Pulse 81   Ht '5\' 9"'$  (1.753 m)   Wt 212 lb 12.8 oz (96.5 kg)   SpO2 99%   BMI 31.43 kg/m     Wt Readings from Last 3 Encounters:  03/29/22 212 lb 12.8 oz (96.5 kg)  12/07/21 215 lb 6.4 oz (97.7 kg)  10/14/21 215 lb 9.6 oz (97.8 kg)     GEN:  Well nourished, well developed in no acute distress HEENT: Normal NECK: No JVD; No carotid bruits CARDIAC: RRR, no murmurs, rubs, gallops RESPIRATORY:  Clear to auscultation without rales, wheezing or rhonchi  ABDOMEN: Soft, non-tender, non-distended MUSCULOSKELETAL:  No edema; No deformity  SKIN: Warm and dry NEUROLOGIC:  Alert and oriented x 3 PSYCHIATRIC:  Normal affect   ASSESSMENT:    1. Marfanoid habitus   2. Marfan syndrome    PLAN:    In order of problems listed above:  #Marfan Syndrome: Genetics showed a pathologic gene variant FBN1 (c.8491del (p.Ile2831Serfs*15)) as well as variants of uncertain significance in two genes: DCHS1 (c.8624G>A (p.Arg2875Gln)) and FOXE3 (c.880G>A (p.Ala294Thr)). TTE 07/2020 with normal BiV function and no significant valve disease. No aortic root dilation noted. ETT with no significant  ectopy and cardiac monitor without significant arrhythmias. Will check CTA of aorta (patient would like to start with this and then continue with MRA scans) as well as update her TTE. Discussed that results of this testing will help risk stratify her further for potential future pregnancies. Currently, blood pressure is well controlled. Palpations are not overly bothersome and did not tolerate atenolol in the past and would like to continue with conservative measures -Check CTA aorta; will plan for MRAs in the future -Check TTE -Followed by Dr. Retta Stewart with genetics -Continue hydration -Compression socks        Follow Up: 6 months  Medication Adjustments/Labs and Tests Ordered: Current medicines are reviewed at length with the patient today.  Concerns regarding medicines are outlined above.  Orders Placed This Encounter  Procedures   CT ANGIO CHEST AORTA W/CM & OR WO/CM   Basic metabolic panel   EKG [id number]   ECHOCARDIOGRAM COMPLETE   No orders of the defined types were placed in this encounter.  Patient Instructions  Medication Instructions:   Your physician recommends that you continue on your current medications as directed. Please refer to the Current Medication list given to you today.  *If you need a refill on your cardiac medications before your next appointment, please call your pharmacy*   Testing/Procedures:  1.)  Your physician has requested that you have an echocardiogram. Echocardiography is a painless test that uses sound waves to create images of your heart. It provides your doctor with information about the size and shape of your heart and how well your heart's chambers and valves are working. This procedure takes approximately one hour. There are no restrictions for this procedure. Please do NOT wear cologne, perfume, aftershave, or lotions (deodorant is allowed). Please arrive 15 minutes prior to your appointment time.   2.)  CT ANGIO CHEST AORTA WITH/WITHOUT  CONTRAST   3.) Follow-Up: At [facility], you  and your health needs are our priority.  As part of our continuing mission to provide you with exceptional heart care, we have created designated Provider Care Teams.  These Care Teams include your primary Cardiologist (physician) and Advanced Practice Providers (APPs -  Physician Assistants and Nurse Practitioners) who all work together to provide you with the care you need, when you need it.  We recommend signing up for the patient portal called "MyChart".  Sign up information is provided on this After Visit Summary.  MyChart is used to connect with patients for Virtual Visits (Telemedicine).  Patients are able to view lab/test results, encounter notes, upcoming appointments, etc.  Non-urgent messages can be sent to your provider as well.   To learn more about what you can do with MyChart, go to [email or web address].    Your next appointment:   6 month(s)  The format for your next appointment:   In Person  Provider:   DR. Johney Frame   Important Information About Sugar         I,Coren O'Brien,acting as a scribe for Freada Bergeron, MD.,have documented all relevant documentation on the behalf of Freada Bergeron, MD,as directed by  Freada Bergeron, MD while in the presence of Freada Bergeron, MD.  I, Freada Bergeron, MD, have reviewed all documentation for this visit. The documentation on 03/29/22 for the exam, diagnosis, procedures, and orders are all accurate and complete.   Signed, Freada Bergeron, MD  03/29/2022 8:41 AM    [place]

## 2022-03-30 ENCOUNTER — Other Ambulatory Visit (HOSPITAL_COMMUNITY): Payer: Self-pay

## 2022-04-01 ENCOUNTER — Other Ambulatory Visit (HOSPITAL_COMMUNITY): Payer: Self-pay

## 2022-04-02 ENCOUNTER — Encounter: Payer: Self-pay | Admitting: Cardiology

## 2022-04-02 ENCOUNTER — Encounter (HOSPITAL_BASED_OUTPATIENT_CLINIC_OR_DEPARTMENT_OTHER): Payer: Self-pay

## 2022-04-02 ENCOUNTER — Ambulatory Visit (HOSPITAL_BASED_OUTPATIENT_CLINIC_OR_DEPARTMENT_OTHER)
Admission: RE | Admit: 2022-04-02 | Discharge: 2022-04-02 | Disposition: A | Discharge status: Home / Self Care | Payer: 59 | Source: Ambulatory Visit | Attending: Cardiology | Admitting: Cardiology

## 2022-04-02 DIAGNOSIS — Q8740 Marfan's syndrome, unspecified: Secondary | ICD-10-CM | POA: Insufficient documentation

## 2022-04-02 DIAGNOSIS — R2991 Unspecified symptoms and signs involving the musculoskeletal system: Secondary | ICD-10-CM | POA: Diagnosis not present

## 2022-04-02 DIAGNOSIS — R079 Chest pain, unspecified: Secondary | ICD-10-CM | POA: Diagnosis not present

## 2022-04-02 DIAGNOSIS — Z9049 Acquired absence of other specified parts of digestive tract: Secondary | ICD-10-CM | POA: Diagnosis not present

## 2022-04-02 MED ORDER — IOHEXOL 350 MG/ML SOLN
80.0000 mL | Freq: Once | INTRAVENOUS | Status: AC | PRN
  Administered 2022-04-02: 80 mL via INTRAVENOUS

## 2022-04-04 ENCOUNTER — Telehealth: Payer: Self-pay | Admitting: *Deleted

## 2022-04-04 ENCOUNTER — Other Ambulatory Visit (HOSPITAL_COMMUNITY): Payer: Self-pay

## 2022-04-04 DIAGNOSIS — Q8740 Marfan's syndrome, unspecified: Secondary | ICD-10-CM

## 2022-04-04 DIAGNOSIS — N9069 Other specified hypertrophy of vulva: Secondary | ICD-10-CM | POA: Diagnosis not present

## 2022-04-04 DIAGNOSIS — R2991 Unspecified symptoms and signs involving the musculoskeletal system: Secondary | ICD-10-CM

## 2022-04-04 DIAGNOSIS — R635 Abnormal weight gain: Secondary | ICD-10-CM | POA: Diagnosis not present

## 2022-04-04 DIAGNOSIS — N9089 Other specified noninflammatory disorders of vulva and perineum: Secondary | ICD-10-CM | POA: Diagnosis not present

## 2022-04-04 DIAGNOSIS — G4730 Sleep apnea, unspecified: Secondary | ICD-10-CM | POA: Diagnosis not present

## 2022-04-04 MED ORDER — MOUNJARO 2.5 MG/0.5ML ~~LOC~~ SOAJ
2.5000 mg | SUBCUTANEOUS | 0 refills | Status: DC
  Filled 2022-04-04 – 2022-07-27 (×5): qty 2, 28d supply, fill #0

## 2022-04-04 NOTE — Telephone Encounter (Signed)
Pt made aware of results via mychart encounter.  She is aware via mychart that we will order for her to get a repeat MRA OF THE CHEST to be done in one year, for survelliance of her aorta.   Will place the order in the system and send a message to our [facility] Schedulers to call her back and arrange this appt for that time. Will place future BMET in the system as well.  Advised the pt to call the office back with any additional questions or concerns regarding this result and future plan.

## 2022-04-04 NOTE — Telephone Encounter (Signed)
-----   Message from Freada Bergeron, MD sent at 04/03/2022  8:22 PM EST ----- Her CT scan shows that her aorta is normal in size with no evidence of plaque or coronary calcium. This is great news. We can monitor with yearly MRAs of her aorta going forward.

## 2022-04-05 ENCOUNTER — Other Ambulatory Visit (HOSPITAL_COMMUNITY): Payer: Self-pay

## 2022-04-07 ENCOUNTER — Other Ambulatory Visit (HOSPITAL_COMMUNITY): Payer: Self-pay

## 2022-04-08 ENCOUNTER — Encounter: Payer: Self-pay | Admitting: Cardiology

## 2022-04-12 ENCOUNTER — Other Ambulatory Visit (HOSPITAL_COMMUNITY): Payer: 59

## 2022-04-12 ENCOUNTER — Other Ambulatory Visit (HOSPITAL_COMMUNITY): Payer: Self-pay

## 2022-04-12 MED ORDER — AMOXICILLIN 500 MG PO CAPS
500.0000 mg | ORAL_CAPSULE | Freq: Four times a day (QID) | ORAL | 0 refills | Status: DC
  Filled 2022-04-12: qty 30, 8d supply, fill #0

## 2022-04-13 ENCOUNTER — Encounter: Payer: Self-pay | Admitting: Family Medicine

## 2022-04-15 ENCOUNTER — Other Ambulatory Visit (HOSPITAL_COMMUNITY): Payer: Self-pay

## 2022-04-18 ENCOUNTER — Other Ambulatory Visit (HOSPITAL_COMMUNITY): Payer: Self-pay

## 2022-04-19 ENCOUNTER — Other Ambulatory Visit (HOSPITAL_COMMUNITY): Payer: Self-pay

## 2022-04-19 MED ORDER — PANTOPRAZOLE SODIUM 40 MG PO TBEC
40.0000 mg | DELAYED_RELEASE_TABLET | Freq: Every day | ORAL | 3 refills | Status: DC
  Filled 2022-04-19 – 2022-05-05 (×2): qty 90, 90d supply, fill #0
  Filled 2022-07-25 – 2022-08-17 (×2): qty 90, 90d supply, fill #1
  Filled 2022-11-08: qty 90, 90d supply, fill #2
  Filled 2023-02-02 – 2023-02-06 (×2): qty 90, 90d supply, fill #3

## 2022-04-26 DIAGNOSIS — N83202 Unspecified ovarian cyst, left side: Secondary | ICD-10-CM | POA: Diagnosis not present

## 2022-04-26 DIAGNOSIS — N83201 Unspecified ovarian cyst, right side: Secondary | ICD-10-CM | POA: Diagnosis not present

## 2022-05-05 ENCOUNTER — Other Ambulatory Visit (HOSPITAL_COMMUNITY): Payer: Self-pay

## 2022-05-05 ENCOUNTER — Ambulatory Visit (HOSPITAL_COMMUNITY): Payer: 59 | Attending: Cardiology

## 2022-05-05 ENCOUNTER — Telehealth: Payer: Self-pay | Admitting: *Deleted

## 2022-05-05 DIAGNOSIS — Q8740 Marfan's syndrome, unspecified: Secondary | ICD-10-CM

## 2022-05-05 DIAGNOSIS — R2991 Unspecified symptoms and signs involving the musculoskeletal system: Secondary | ICD-10-CM | POA: Insufficient documentation

## 2022-05-05 DIAGNOSIS — R079 Chest pain, unspecified: Secondary | ICD-10-CM | POA: Diagnosis not present

## 2022-05-05 DIAGNOSIS — K08499 Partial loss of teeth due to other specified cause, unspecified class: Secondary | ICD-10-CM | POA: Diagnosis not present

## 2022-05-05 LAB — ECHOCARDIOGRAM COMPLETE
Area-P 1/2: 4.39 cm2
S' Lateral: 2.1 cm

## 2022-05-05 MED ORDER — ACETAMINOPHEN-CODEINE 300-30 MG PO TABS
1.0000 | ORAL_TABLET | Freq: Four times a day (QID) | ORAL | 0 refills | Status: DC | PRN
  Filled 2022-05-05: qty 12, 3d supply, fill #0

## 2022-05-05 NOTE — Telephone Encounter (Signed)
The patient has been notified of the result and verbalized understanding.  All questions (if any) were answered.  Pt aware she will need a repeat echo in one year for monitoring of marfan's.  She is aware that I will place the order for repeat echo in one year in the system and send a message to our Echo Scheduler to call her back closer to that time to arrange that appt.   Pt verbalized understanding and agrees with this plan.

## 2022-05-05 NOTE — Telephone Encounter (Signed)
-----   Message from Freada Bergeron, MD sent at 05/05/2022  1:28 PM EST ----- Her echo looks great with normal pumping function and no significant valve disease. This is great news.   Given diagnosis of Marfan, will continue with annual echoes for monitoring.

## 2022-05-11 ENCOUNTER — Other Ambulatory Visit: Payer: Self-pay

## 2022-05-11 ENCOUNTER — Other Ambulatory Visit (HOSPITAL_COMMUNITY): Payer: Self-pay

## 2022-05-11 MED ORDER — WEGOVY 2.4 MG/0.75ML ~~LOC~~ SOAJ
2.4000 mg | SUBCUTANEOUS | 1 refills | Status: DC
  Filled 2022-05-11: qty 3, 28d supply, fill #0
  Filled 2022-06-09 (×2): qty 3, 28d supply, fill #1

## 2022-05-12 ENCOUNTER — Other Ambulatory Visit (HOSPITAL_COMMUNITY): Payer: Self-pay

## 2022-05-12 DIAGNOSIS — Z6831 Body mass index (BMI) 31.0-31.9, adult: Secondary | ICD-10-CM | POA: Diagnosis not present

## 2022-05-12 DIAGNOSIS — J320 Chronic maxillary sinusitis: Secondary | ICD-10-CM | POA: Diagnosis not present

## 2022-05-12 DIAGNOSIS — G4733 Obstructive sleep apnea (adult) (pediatric): Secondary | ICD-10-CM | POA: Diagnosis not present

## 2022-05-12 MED ORDER — CHLORHEXIDINE GLUCONATE 0.12 % MT SOLN
15.0000 mL | Freq: Two times a day (BID) | OROMUCOSAL | 0 refills | Status: DC
  Filled 2022-05-12: qty 473, 16d supply, fill #0

## 2022-05-13 ENCOUNTER — Other Ambulatory Visit (HOSPITAL_COMMUNITY): Payer: Self-pay

## 2022-05-18 ENCOUNTER — Other Ambulatory Visit (HOSPITAL_COMMUNITY): Payer: Self-pay

## 2022-05-19 ENCOUNTER — Other Ambulatory Visit (HOSPITAL_COMMUNITY): Payer: Self-pay

## 2022-05-19 MED ORDER — SPIRONOLACTONE 50 MG PO TABS
50.0000 mg | ORAL_TABLET | Freq: Two times a day (BID) | ORAL | 0 refills | Status: DC
  Filled 2022-05-19 – 2022-06-09 (×2): qty 180, 90d supply, fill #0

## 2022-06-03 ENCOUNTER — Other Ambulatory Visit (HOSPITAL_COMMUNITY): Payer: Self-pay

## 2022-06-09 ENCOUNTER — Other Ambulatory Visit: Payer: Self-pay

## 2022-06-09 ENCOUNTER — Other Ambulatory Visit (HOSPITAL_COMMUNITY): Payer: Self-pay

## 2022-06-13 ENCOUNTER — Other Ambulatory Visit (HOSPITAL_COMMUNITY): Payer: Self-pay

## 2022-06-20 ENCOUNTER — Other Ambulatory Visit (HOSPITAL_COMMUNITY): Payer: Self-pay

## 2022-06-20 ENCOUNTER — Other Ambulatory Visit: Payer: Self-pay | Admitting: Obstetrics & Gynecology

## 2022-06-20 DIAGNOSIS — Z1231 Encounter for screening mammogram for malignant neoplasm of breast: Secondary | ICD-10-CM

## 2022-06-20 MED ORDER — AMPHETAMINE-DEXTROAMPHET ER 20 MG PO CP24
20.0000 mg | ORAL_CAPSULE | Freq: Every morning | ORAL | 0 refills | Status: DC
  Filled 2022-06-20: qty 30, 30d supply, fill #0

## 2022-06-23 ENCOUNTER — Other Ambulatory Visit (HOSPITAL_COMMUNITY): Payer: Self-pay

## 2022-06-23 MED ORDER — AMOXICILLIN 500 MG PO CAPS
500.0000 mg | ORAL_CAPSULE | Freq: Three times a day (TID) | ORAL | 0 refills | Status: DC
  Filled 2022-06-23: qty 21, 7d supply, fill #0

## 2022-06-24 ENCOUNTER — Other Ambulatory Visit (HOSPITAL_COMMUNITY): Payer: Self-pay

## 2022-06-29 ENCOUNTER — Other Ambulatory Visit (HOSPITAL_COMMUNITY): Payer: Self-pay

## 2022-06-29 DIAGNOSIS — F411 Generalized anxiety disorder: Secondary | ICD-10-CM | POA: Diagnosis not present

## 2022-06-29 DIAGNOSIS — F900 Attention-deficit hyperactivity disorder, predominantly inattentive type: Secondary | ICD-10-CM | POA: Diagnosis not present

## 2022-06-29 DIAGNOSIS — F32A Depression, unspecified: Secondary | ICD-10-CM | POA: Diagnosis not present

## 2022-06-29 MED ORDER — LAMOTRIGINE 25 MG PO TABS
ORAL_TABLET | ORAL | 0 refills | Status: DC
  Filled 2022-06-29: qty 60, 37d supply, fill #0

## 2022-06-29 MED ORDER — CLONAZEPAM 0.5 MG PO TABS
0.5000 mg | ORAL_TABLET | Freq: Every day | ORAL | 0 refills | Status: DC | PRN
  Filled 2022-06-29: qty 30, 30d supply, fill #0

## 2022-06-29 MED ORDER — AMPHETAMINE-DEXTROAMPHET ER 20 MG PO CP24
20.0000 mg | ORAL_CAPSULE | Freq: Every morning | ORAL | 0 refills | Status: DC
  Filled 2022-07-25: qty 30, 30d supply, fill #0

## 2022-07-07 ENCOUNTER — Encounter (HOSPITAL_BASED_OUTPATIENT_CLINIC_OR_DEPARTMENT_OTHER): Payer: Self-pay | Admitting: Pulmonary Disease

## 2022-07-07 ENCOUNTER — Other Ambulatory Visit (HOSPITAL_COMMUNITY): Payer: Self-pay

## 2022-07-07 ENCOUNTER — Ambulatory Visit: Payer: Commercial Managed Care - PPO | Admitting: Family Medicine

## 2022-07-07 ENCOUNTER — Encounter: Payer: Self-pay | Admitting: Family Medicine

## 2022-07-07 ENCOUNTER — Ambulatory Visit (INDEPENDENT_AMBULATORY_CARE_PROVIDER_SITE_OTHER): Payer: Commercial Managed Care - PPO | Admitting: Pulmonary Disease

## 2022-07-07 VITALS — BP 92/62 | Temp 91.4°F | Ht 69.0 in | Wt 210.0 lb

## 2022-07-07 VITALS — BP 90/60 | HR 99 | Temp 97.7°F | Ht 69.0 in | Wt 209.0 lb

## 2022-07-07 DIAGNOSIS — R051 Acute cough: Secondary | ICD-10-CM | POA: Diagnosis not present

## 2022-07-07 DIAGNOSIS — J309 Allergic rhinitis, unspecified: Secondary | ICD-10-CM | POA: Diagnosis not present

## 2022-07-07 DIAGNOSIS — G4733 Obstructive sleep apnea (adult) (pediatric): Secondary | ICD-10-CM | POA: Diagnosis not present

## 2022-07-07 MED ORDER — AZITHROMYCIN 250 MG PO TABS
ORAL_TABLET | ORAL | 0 refills | Status: AC
  Filled 2022-07-07: qty 6, 5d supply, fill #0

## 2022-07-07 MED ORDER — AZELASTINE HCL 0.15 % NA SOLN: 1.0000 | Freq: Every morning | NASAL | Status: DC

## 2022-07-07 MED ORDER — FLUTICASONE PROPIONATE 50 MCG/ACT NA SUSP: 1.0000 | Freq: Every evening | NASAL | 2 refills | Status: DC

## 2022-07-07 NOTE — Assessment & Plan Note (Signed)
Acute, now ongoing approximately 2 weeks.  Possible initial viral pharyngitis versus strep throat (treated fairly adequately with amoxicillin 3 times daily x 5 days) now followed with bacterial superinfection concerning for bacterial bronchitis. Recommend continued symptomatic care and will have her complete a course of azithromycin x 5 days. Recommended rest and fluids.  Return precautions and ER precautions provided.

## 2022-07-07 NOTE — Progress Notes (Signed)
[facility]  Chief Complaint  Patient presents with   Follow-up    Discuss scaring from having inspire device    Past Surgical History:  She  has a past surgical history that includes Myringotomy (multiple); Ganglion cyst excision; Frenulectomy, lingual (2001); Foot surgery; Esophagogastroduodenoscopy (07/2018); Remove and replace lens (Left, 2018); Cholecystectomy (N/A, 03/16/2021); and Breast lumpectomy (Right, 03/2018).  Past Medical History:  ADHD, Chronic back pain, Marfan syndrome, GERD, Migraine HA, Depression, IBS   Constitutional:  BP 92/62 (BP Location: Left Arm, Cuff Size: Normal)   Temp (!) 91.4 F (33 C)   Ht '5\' 9"'$  (1.753 m)   Wt 210 lb (95.3 kg)   LMP 06/30/2022   SpO2 99%   BMI 31.01 kg/m   Brief Summary:  Heather Stewart is a 37 y.o. female smoker with obstructive sleep apnea. She works as a Marine scientist.       Subjective:   I last saw her in June 2021.  She was tried on CPAP.  She was not able to adjust to the mask fit or pressure.  She also tried an oral appliance, but wasn't able to tolerate this.  She was seen by Dr. Redmond Baseman with ENT and advised to have further sleep follow up to determine if she might be a candidate for an oral appliance.  She was started on wegovy and lost about 25 lbs.  Her sleep and snoring improved.  Over the past few months her sleep has gotten worse and her snoring is worse also.  She is a mouth breather and her throat gets snore at night.  She tries to sleep on her side.  Her sleep is disrupted.  She feels tired during the day and has to drink [facility].  She uses adderall for ADHD.  She uses seroquel at night and this helps her fall asleep.  She has noticed more trouble with sinus congestion and drainage.   Physical Exam:   Appearance - well kempt   ENMT - no sinus tenderness, no oral exudate, no LAN, Mallampati 3 airway, no stridor  Respiratory - equal breath sounds bilaterally, no  wheezing or rales  CV - s1s2 regular rate and rhythm, no murmurs  Ext - no clubbing, no edema  Skin - no rashes  Psych - normal mood and affect   Sleep Tests:  HST 01/18/18 >> AHI 7, SpO2 low 66%   Cardiac Tests:  Echo 05/05/22 >> EF 60 to 65%  Social History:  She  reports that she has been smoking cigarettes and e-cigarettes. She has never used smokeless tobacco. She reports current alcohol use. She reports that she does not use drugs.  Family History:  Her family history includes Alcohol abuse in her father; Breast cancer in her maternal aunt, maternal grandmother, and another family member; COPD in her paternal grandfather; Cancer in her brother; Coronary artery disease in an other family member; Heart disease in an other family member; Kidney disease in an other family member; Liver cancer in her brother; Migraines in her mother; Other in her father.     Assessment/Plan:   Obstructive sleep apnea. - she was not able to tolerate previous trial of CPAP - she was not able to tolerate an oral appliance - discussed pro's/con's of Inspire device - will arrange for home sleep study to assess current status of obstructive sleep apnea - discussed positional therapy  Allergic rhinitis. - will have her try astepro in the morning and  flonase in the evening - continue nasal irrigation  Time Spent Involved in Patient Care on Day of Examination:  36 minutes  Follow up:   Patient Instructions  Use astepro 1 spray in each nostril in the morning, and flonase 1 spray in each nostril in the evening  Will arrange for a home sleep study  Will call to arrange for follow up after sleep study reviewed   Medication List:   Allergies as of 07/07/2022       Reactions   Amoxicillin-pot Clavulanate Other (See Comments)   Severe GI upset Diarrhea; per MD's office, tolerates amoxicillin (clavulanic acid is the issue)   Neosporin [neomycin-bacitracin Zn-polymyx] Rash   Abilify  [aripiprazole]    akathisia   Bupropion    Akathisia   Doxycycline Other (See Comments)   abd pain   Vyvanse [lisdexamfetamine Dimesylate] Other (See Comments)   headache        Medication List        Accurate as of July 07, 2022  1:52 PM. If you have any questions, ask your nurse or doctor.          STOP taking these medications    acetaminophen-codeine 300-30 MG tablet Commonly known as: TYLENOL #3 Stopped by: Eliezer Lofts, MD   amoxicillin 500 MG capsule Commonly known as: AMOXIL Stopped by: Eliezer Lofts, MD   benzonatate 100 MG capsule Commonly known as: TESSALON Stopped by: Eliezer Lofts, MD   COQ10 PO Stopped by: Eliezer Lofts, MD   meloxicam 15 MG tablet Commonly known as: MOBIC Stopped by: Eliezer Lofts, MD   podofilox 0.5 % external solution Commonly known as: CONDYLOX Stopped by: Eliezer Lofts, MD   Prenatal (w/Iron & FA) 27-0.8 MG Tabs Stopped by: Eliezer Lofts, MD       TAKE these medications    amphetamine-dextroamphetamine 20 MG 24 hr capsule Commonly known as: Adderall XR Take 1 capsule (20 mg total) by mouth in the morning.   Azelastine HCl 0.15 % Soln Commonly known as: Astepro Place 1 spray into the nose in the morning. Started by: Chesley Mires, MD   azithromycin 250 MG tablet Commonly known as: ZITHROMAX Take 2 tablets (500 mg total) by mouth daily for 1 day, THEN 1 tablet (250 mg total) daily for 4 days. Start taking on: July 07, 2022 Started by: Eliezer Lofts, MD   chlorhexidine 0.12 % solution Commonly known as: PERIDEX Swish and spit 15 mLs 2 (two) times daily for 2 full minutes   clonazePAM 0.5 MG tablet Commonly known as: KlonoPIN Take 1 tablet (0.5 mg total) by mouth daily as needed.   DULoxetine 60 MG capsule Commonly known as: Cymbalta Take 1 capsule (60 mg total) by mouth daily.   fluticasone 50 MCG/ACT nasal spray Commonly known as: FLONASE Place 1 spray into both nostrils at bedtime. Started by: Chesley Mires,  MD   lamoTRIgine 25 MG tablet Commonly known as: LaMICtal Take 1 tablet (25 mg total) every evening for 14 days, THEN 2 tablets (50 mg total) every evening. Start taking on: June 29, 2022   methocarbamol 500 MG tablet Commonly known as: ROBAXIN Take 1 tablet (500 mg total) by mouth 2 (two) times daily as needed for spasm.   Mounjaro 2.5 MG/0.5ML Pen Generic drug: tirzepatide Inject 2.5 mg into the skin once a week.   ondansetron 4 MG tablet Commonly known as: Zofran Take 1 tablet (4 mg total) by mouth as needed.   pantoprazole 40 MG tablet Commonly known as:  PROTONIX Take 1 tablet (40 mg total) by mouth daily.   QUEtiapine 50 MG tablet Commonly known as: SEROquel Take 1-2 tablets (50-100 mg total) by mouth at bedtime.   spironolactone 50 MG tablet Commonly known as: ALDACTONE Take 1 tablet (50 mg total) by mouth 2 (two) times daily.   Wegovy 2.4 MG/0.75ML Soaj Generic drug: Semaglutide-Weight Management Inject 2.4 mg into the skin once a week. What changed: Another medication with the same name was removed. Continue taking this medication, and follow the directions you see here. Changed by: Eliezer Lofts, MD        Signature:  Chesley Mires, MD [place] Pager - [phone] 07/07/2022, 1:52 PM

## 2022-07-07 NOTE — Patient Instructions (Signed)
Use astepro 1 spray in each nostril in the morning, and flonase 1 spray in each nostril in the evening  Will arrange for a home sleep study  Will call to arrange for follow up after sleep study reviewed

## 2022-07-07 NOTE — Progress Notes (Signed)
Patient ID: Heather Stewart, female    DOB: [birth year], 37 y.o.   MRN: [id number]  This visit was conducted in person.  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 moved to chest.  Wakes her self coughing at night.  Productive of green mucus.  No SOB, no wheeze  No fever  Had initial body ache, some joint aches   Sick contacts:  strep at work... works at [employer] as Therapist, sports.  COVID testing:    home COVID test 1 week agos     She has tried to treat with  amoxicillin as above, Claritin, nasal sprays   No history of chronic lung disease such as asthma or COPD. Non-smoker.      Relevant past medical, surgical, family and social history reviewed and updated as indicated. Interim medical history since our last visit reviewed. Allergies and medications reviewed and updated. Outpatient Medications Prior to Visit  Medication Sig Dispense Refill   amphetamine-dextroamphetamine (ADDERALL XR) 20 MG 24 hr capsule Take 1 capsule (20 mg total) by mouth in the morning. 30 capsule 0   chlorhexidine  (PERIDEX) 0.12 % solution Swish and spit 15 mLs 2 (two) times daily for 2 full minutes 473 mL 0   clonazePAM (KLONOPIN) 0.5 MG tablet Take 1 tablet (0.5 mg total) by mouth daily as needed. 30 tablet 0   DULoxetine (CYMBALTA) 60 MG capsule Take 1 capsule (60 mg total) by mouth daily. 90 capsule 1   lamoTRIgine (LAMICTAL) 25 MG tablet Take 1 tablet (25 mg total) every evening for 14 days, THEN 2 tablets (50 mg total) every evening. 60 tablet 0   methocarbamol (ROBAXIN) 500 MG tablet Take 1 tablet (500 mg total) by mouth 2 (two) times daily as needed for spasm. 60 tablet 1   ondansetron (ZOFRAN) 4 MG tablet Take 1 tablet (4 mg total) by mouth as needed. 20 tablet 0   pantoprazole (PROTONIX) 40 MG tablet Take 1 tablet (40 mg total) by mouth daily. 90 tablet 3   QUEtiapine (SEROQUEL) 50 MG tablet Take 1-2 tablets (50-100 mg total) by mouth at bedtime. 180 tablet 1   Semaglutide-Weight Management (WEGOVY) 2.4 MG/0.75ML SOAJ Inject 2.4 mg into the skin once a week. 3 mL 1   spironolactone (ALDACTONE) 50 MG tablet Take 1 tablet (50 mg total) by mouth 2 (two)  times daily. 180 tablet 0   tirzepatide (MOUNJARO) 2.5 MG/0.5ML Pen Inject 2.5 mg into the skin once a week. 2 mL 0   acetaminophen-codeine (TYLENOL #3) 300-30 MG tablet Take 1 tablet by mouth every 6 (six) hours as needed for pain 12 tablet 0   amoxicillin (AMOXIL) 500 MG capsule Take 1 capsule (500 mg total) by mouth 3 times a day until gone 21 capsule 0   amphetamine-dextroamphetamine (ADDERALL XR) 20 MG 24 hr capsule Take 1 capsule (20 mg total) by mouth every morning. 30 capsule 0   amphetamine-dextroamphetamine (ADDERALL XR) 20 MG 24 hr capsule Take 1 capsule (20 mg total) by mouth every morning. 30 capsule 0   amphetamine-dextroamphetamine (ADDERALL XR) 20 MG 24 hr capsule Take 1 capsule (20 mg total) by mouth in the morning. 30 capsule 0   amphetamine-dextroamphetamine (ADDERALL XR) 20 MG 24 hr capsule Take 1 capsule (20 mg total) by mouth in the  morning. 30 capsule 0   amphetamine-dextroamphetamine (ADDERALL XR) 20 MG 24 hr capsule Take 1 capsule (20 mg total) by mouth in the morning. 30 capsule 0   amphetamine-dextroamphetamine (ADDERALL XR) 20 MG 24 hr capsule Take 1 capsule (20 mg total) by mouth in the morning. 30 capsule 0   benzonatate (TESSALON) 100 MG capsule Take 1 capsule (100 mg total) by mouth 3 (three) times daily as needed. 30 capsule 0   Coenzyme Q10 (COQ10 PO) Take 1 capsule by mouth daily.     meloxicam (MOBIC) 15 MG tablet Take 1 tablet (15 mg total) by mouth daily. 30 tablet 2   podofilox (CONDYLOX) 0.5 % external solution Apply topically 2 times a day for 3 days then stop for 4 days, repeat 7 day cycle until no visible wart tissue/max 4 cycles 7 mL 1   Prenatal Multivit-Min-Fe-FA (PRENATAL, W/IRON & FA,) 27-0.8 MG TABS Take 1 tablet by mouth daily.     Semaglutide-Weight Management (WEGOVY) 1.7 MG/0.75ML SOAJ Inject 1.7 mg into the skin once a week. (Patient not taking: Reported on 10/14/2021) 3 mL 0   No facility-administered medications prior to visit.     Per HPI unless specifically indicated in ROS section below Review of Systems  Constitutional:  Positive for fatigue. Negative for fever.  HENT:  Positive for congestion, postnasal drip and sore throat.   Eyes:  Negative for pain.  Respiratory:  Positive for cough. Negative for shortness of breath.   Cardiovascular:  Negative for chest pain, palpitations and leg swelling.  Gastrointestinal:  Negative for abdominal pain.  Genitourinary:  Negative for dysuria and vaginal bleeding.  Musculoskeletal:  Negative for back pain.  Neurological:  Positive for headaches. Negative for syncope and light-headedness.  Psychiatric/Behavioral:  Negative for dysphoric mood.    Objective:  BP 90/60   Pulse 99   Temp 97.7 F (36.5 C) (Temporal)   Ht '5\' 9"'$  (1.753 m)   Wt 209 lb (94.8 kg)   LMP 06/30/2022   SpO2 98%   BMI 30.86 kg/m   Wt Readings from Last 3 Encounters:   07/07/22 209 lb (94.8 kg)  03/29/22 212 lb 12.8 oz (96.5 kg)  12/07/21 215 lb 6.4 oz (97.7 kg)      Physical Exam Constitutional:      General: She is not in acute distress.    Appearance: Normal appearance. She is well-developed. She is not ill-appearing or toxic-appearing.  HENT:     Head: Normocephalic.     Comments: Hoarse voice     Right  Ear: Hearing, tympanic membrane, ear canal and external ear normal. Tympanic membrane is not erythematous, retracted or bulging.     Left Ear: Hearing, tympanic membrane, ear canal and external ear normal. Tympanic membrane is not erythematous, retracted or bulging.     Nose: Congestion present. No mucosal edema or rhinorrhea.     Right Sinus: No maxillary sinus tenderness or frontal sinus tenderness.     Left Sinus: No maxillary sinus tenderness or frontal sinus tenderness.     Mouth/Throat:     Pharynx: Uvula midline.  Eyes:     General: Lids are normal. Lids are everted, no foreign bodies appreciated.     Conjunctiva/sclera: Conjunctivae normal.     Pupils: Pupils are equal, round, and reactive to light.  Neck:     Thyroid: No thyroid mass or thyromegaly.     Vascular: No carotid bruit.     Trachea: Trachea normal.  Cardiovascular:     Rate and Rhythm: Normal rate and regular rhythm.     Pulses: Normal pulses.     Heart sounds: Normal heart sounds, S1 normal and S2 normal. No murmur heard.    No friction rub. No gallop.  Pulmonary:     Effort: Pulmonary effort is normal. No tachypnea or respiratory distress.     Breath sounds: Normal breath sounds. No decreased breath sounds, wheezing, rhonchi or rales.  Abdominal:     General: Bowel sounds are normal.     Palpations: Abdomen is soft.     Tenderness: There is no abdominal tenderness.  Musculoskeletal:     Cervical back: Normal range of motion and neck supple.  Skin:    General: Skin is warm and dry.     Findings: No rash.  Neurological:     Mental Status: She is alert.   Psychiatric:        Mood and Affect: Mood is not anxious or depressed.        Speech: Speech normal.        Behavior: Behavior normal. Behavior is cooperative.        Thought Content: Thought content normal.        Judgment: Judgment normal.       Results for orders placed or performed in visit on 05/05/22  ECHOCARDIOGRAM COMPLETE  Result Value Ref Range   Area-P 1/2 4.39 cm2   S' Lateral 2.10 cm    Assessment and Plan  Acute cough Assessment & Plan: Acute, now ongoing approximately 2 weeks.  Possible initial viral pharyngitis versus strep throat (treated fairly adequately with amoxicillin 3 times daily x 5 days) now followed with bacterial superinfection concerning for bacterial bronchitis. Recommend continued symptomatic care and will have her complete a course of azithromycin x 5 days. Recommended rest and fluids.  Return precautions and ER precautions provided.   Other orders -     Azithromycin; Take 2 tablets (500 mg total) by mouth daily for 1 day, THEN 1 tablet (250 mg total) daily for 4 days.  Dispense: 6 tablet; Refill: 0    Return if symptoms worsen or fail to improve.   Eliezer Lofts, MD

## 2022-07-08 ENCOUNTER — Other Ambulatory Visit (HOSPITAL_COMMUNITY): Payer: Self-pay

## 2022-07-08 MED ORDER — WEGOVY 2.4 MG/0.75ML ~~LOC~~ SOAJ
2.4000 mg | SUBCUTANEOUS | 1 refills | Status: DC
  Filled 2022-07-08: qty 3, 28d supply, fill #0
  Filled 2022-07-25 – 2022-09-19 (×3): qty 3, 28d supply, fill #1

## 2022-07-11 ENCOUNTER — Other Ambulatory Visit (HOSPITAL_COMMUNITY): Payer: Self-pay

## 2022-07-11 MED ORDER — ZEPBOUND 2.5 MG/0.5ML ~~LOC~~ SOAJ
2.5000 mg | SUBCUTANEOUS | 0 refills | Status: DC
  Filled 2022-07-11 – 2022-07-28 (×3): qty 2, 28d supply, fill #0

## 2022-07-12 ENCOUNTER — Other Ambulatory Visit (HOSPITAL_COMMUNITY): Payer: Self-pay

## 2022-07-13 ENCOUNTER — Other Ambulatory Visit (HOSPITAL_COMMUNITY): Payer: Self-pay

## 2022-07-14 ENCOUNTER — Other Ambulatory Visit (HOSPITAL_COMMUNITY): Payer: Self-pay

## 2022-07-15 ENCOUNTER — Encounter: Payer: Self-pay | Admitting: Family Medicine

## 2022-07-20 ENCOUNTER — Encounter (HOSPITAL_BASED_OUTPATIENT_CLINIC_OR_DEPARTMENT_OTHER): Payer: Commercial Managed Care - PPO

## 2022-07-25 ENCOUNTER — Other Ambulatory Visit (HOSPITAL_COMMUNITY): Payer: Self-pay

## 2022-07-26 ENCOUNTER — Other Ambulatory Visit: Payer: Self-pay

## 2022-07-26 DIAGNOSIS — F32A Depression, unspecified: Secondary | ICD-10-CM | POA: Diagnosis not present

## 2022-07-26 DIAGNOSIS — F411 Generalized anxiety disorder: Secondary | ICD-10-CM | POA: Diagnosis not present

## 2022-07-26 DIAGNOSIS — F900 Attention-deficit hyperactivity disorder, predominantly inattentive type: Secondary | ICD-10-CM | POA: Diagnosis not present

## 2022-07-27 ENCOUNTER — Other Ambulatory Visit (HOSPITAL_COMMUNITY): Payer: Self-pay

## 2022-07-27 MED ORDER — AMPHETAMINE-DEXTROAMPHET ER 20 MG PO CP24
20.0000 mg | ORAL_CAPSULE | Freq: Every morning | ORAL | 0 refills | Status: DC
  Filled 2022-07-27 – 2022-08-29 (×4): qty 30, 30d supply, fill #0

## 2022-07-27 MED ORDER — ESZOPICLONE 2 MG PO TABS
2.0000 mg | ORAL_TABLET | Freq: Every day | ORAL | 2 refills | Status: DC
  Filled 2022-07-27: qty 30, 30d supply, fill #0
  Filled 2022-08-22 (×2): qty 30, 30d supply, fill #1
  Filled 2022-09-26: qty 30, 30d supply, fill #2

## 2022-07-28 ENCOUNTER — Other Ambulatory Visit (HOSPITAL_COMMUNITY): Payer: Self-pay

## 2022-07-28 ENCOUNTER — Other Ambulatory Visit: Payer: Self-pay

## 2022-07-28 MED ORDER — LAMOTRIGINE 25 MG PO TABS
ORAL_TABLET | ORAL | 2 refills | Status: DC
  Filled 2022-07-28: qty 60, 30d supply, fill #0
  Filled 2022-08-29: qty 60, 7d supply, fill #1
  Filled 2022-09-26: qty 60, 30d supply, fill #2

## 2022-08-02 ENCOUNTER — Other Ambulatory Visit (HOSPITAL_COMMUNITY): Payer: Self-pay

## 2022-08-11 ENCOUNTER — Ambulatory Visit: Payer: Commercial Managed Care - PPO

## 2022-08-12 ENCOUNTER — Other Ambulatory Visit (HOSPITAL_COMMUNITY): Payer: Self-pay

## 2022-08-12 DIAGNOSIS — L564 Polymorphous light eruption: Secondary | ICD-10-CM | POA: Diagnosis not present

## 2022-08-12 MED ORDER — TRIAMCINOLONE ACETONIDE 0.1 % EX CREA
1.0000 | TOPICAL_CREAM | Freq: Two times a day (BID) | CUTANEOUS | 0 refills | Status: DC
  Filled 2022-08-12: qty 80, 30d supply, fill #0

## 2022-08-15 ENCOUNTER — Ambulatory Visit
Admission: RE | Admit: 2022-08-15 | Discharge: 2022-08-15 | Disposition: A | Discharge status: Home / Self Care | Payer: Commercial Managed Care - PPO | Source: Ambulatory Visit | Attending: Obstetrics & Gynecology | Admitting: Obstetrics & Gynecology

## 2022-08-15 DIAGNOSIS — Z1231 Encounter for screening mammogram for malignant neoplasm of breast: Secondary | ICD-10-CM

## 2022-08-17 ENCOUNTER — Other Ambulatory Visit (HOSPITAL_COMMUNITY): Payer: Self-pay

## 2022-08-17 ENCOUNTER — Other Ambulatory Visit: Payer: Self-pay | Admitting: Obstetrics & Gynecology

## 2022-08-17 ENCOUNTER — Telehealth: Payer: Self-pay | Admitting: Cardiology

## 2022-08-17 DIAGNOSIS — R928 Other abnormal and inconclusive findings on diagnostic imaging of breast: Secondary | ICD-10-CM

## 2022-08-17 DIAGNOSIS — L700 Acne vulgaris: Secondary | ICD-10-CM | POA: Diagnosis not present

## 2022-08-17 MED ORDER — AMPICILLIN 500 MG PO CAPS
500.0000 mg | ORAL_CAPSULE | Freq: Two times a day (BID) | ORAL | 4 refills | Status: DC
  Filled 2022-08-17: qty 60, 30d supply, fill #0
  Filled 2022-09-26: qty 60, 30d supply, fill #1
  Filled 2022-10-25: qty 60, 30d supply, fill #2
  Filled 2022-11-22: qty 60, 30d supply, fill #3
  Filled 2023-01-12: qty 60, 30d supply, fill #4

## 2022-08-17 NOTE — Telephone Encounter (Signed)
Caller states Dr. Redmond Baseman wants patient to get an updated sleep study to be pre-certified for Inspire surgery.

## 2022-08-17 NOTE — Telephone Encounter (Signed)
Per Dr. Johney Frame, this message should be routed to Dr. Radford Pax and sleep team for further management and assistance with pre-certification for inspire procedure.

## 2022-08-17 NOTE — Telephone Encounter (Signed)
Erlene, Kaszynski E - 08/17/2022  1:30 PM RE: New sleep study?  Sueanne Margarita, MD  Sent: Wed August 17, 2022  2:47 PM  To: Joni Reining, RN; Nuala Alpha, LPN; Freada Bergeron, MD         Message  Please make sure this is the right patient - I do not see that she has ever had a sleep study and I have never seen her    Left a message for Devita to call the office back to endorse to her that this pt has never had a sleep study done by our office and Dr. Redmond Baseman will need to order the sleep study or refer the pt to Dr. Radford Pax for sleep management.

## 2022-08-18 ENCOUNTER — Other Ambulatory Visit (HOSPITAL_COMMUNITY): Payer: Self-pay

## 2022-08-18 MED ORDER — ZEPBOUND 5 MG/0.5ML ~~LOC~~ SOAJ
5.0000 mg | SUBCUTANEOUS | 0 refills | Status: DC
  Filled 2022-08-18 – 2022-08-22 (×2): qty 2, 28d supply, fill #0

## 2022-08-22 ENCOUNTER — Other Ambulatory Visit: Payer: Self-pay

## 2022-08-22 ENCOUNTER — Other Ambulatory Visit (HOSPITAL_COMMUNITY): Payer: Self-pay

## 2022-08-22 NOTE — Telephone Encounter (Signed)
Anjail, Sanden E - 08/17/2022  1:30 PM RE: New sleep study?  Joni Reining, RN  Sent: Mon August 22, 2022  1:51 PM  To: Sueanne Margarita, MD; Nuala Alpha, LPN; Freada Bergeron, MD         Message  Talked to Dr. Redmond Baseman' nurse [phone], patient was referred by Dr. Governor Rooks. Delight Ovens will get in contact with Dr. Halford Chessman regarding this patient's care.

## 2022-08-22 NOTE — Telephone Encounter (Signed)
Called and left message for Devita to see if she needed any additional help to clarify which patient Dr. Redmond Baseman wanted assistance with.

## 2022-08-23 ENCOUNTER — Other Ambulatory Visit: Payer: Self-pay

## 2022-08-29 ENCOUNTER — Other Ambulatory Visit (HOSPITAL_COMMUNITY): Payer: Self-pay

## 2022-09-07 ENCOUNTER — Ambulatory Visit
Admission: RE | Admit: 2022-09-07 | Discharge: 2022-09-07 | Disposition: A | Discharge status: Home / Self Care | Payer: Commercial Managed Care - PPO | Source: Ambulatory Visit | Attending: Obstetrics & Gynecology | Admitting: Obstetrics & Gynecology

## 2022-09-07 ENCOUNTER — Ambulatory Visit: Payer: Commercial Managed Care - PPO

## 2022-09-07 DIAGNOSIS — R922 Inconclusive mammogram: Secondary | ICD-10-CM | POA: Diagnosis not present

## 2022-09-07 DIAGNOSIS — R928 Other abnormal and inconclusive findings on diagnostic imaging of breast: Secondary | ICD-10-CM

## 2022-09-14 ENCOUNTER — Telehealth: Payer: Self-pay | Admitting: Family Medicine

## 2022-09-14 NOTE — Telephone Encounter (Signed)
Called to request that Dr. Sood approve a sleep study in order for patient to be considereCraige Stewart for [facility].  Please advise and call to discuss further.  CB# [phone]

## 2022-09-15 NOTE — Telephone Encounter (Signed)
At pt's last appt with [facility] in February, order was placed for a sleep study to take place. Routing this to Raven, [facility] as well for her to review.

## 2022-09-16 ENCOUNTER — Other Ambulatory Visit (HOSPITAL_COMMUNITY): Payer: Self-pay

## 2022-09-16 ENCOUNTER — Other Ambulatory Visit: Payer: Self-pay

## 2022-09-16 MED ORDER — SPIRONOLACTONE 50 MG PO TABS
50.0000 mg | ORAL_TABLET | Freq: Two times a day (BID) | ORAL | 1 refills | Status: DC
  Filled 2022-09-16: qty 180, 90d supply, fill #0

## 2022-09-16 MED ORDER — ZEPBOUND 7.5 MG/0.5ML ~~LOC~~ SOAJ
7.5000 mg | SUBCUTANEOUS | 0 refills | Status: DC
  Filled 2022-09-16 – 2022-09-21 (×3): qty 2, 28d supply, fill #0

## 2022-09-19 ENCOUNTER — Other Ambulatory Visit (HOSPITAL_COMMUNITY): Payer: Self-pay

## 2022-09-19 MED ORDER — ZEPBOUND 5 MG/0.5ML ~~LOC~~ SOAJ
5.0000 mg | SUBCUTANEOUS | 0 refills | Status: DC
  Filled 2022-09-19: qty 2, 28d supply, fill #0

## 2022-09-20 ENCOUNTER — Other Ambulatory Visit (HOSPITAL_COMMUNITY): Payer: Self-pay

## 2022-09-20 ENCOUNTER — Other Ambulatory Visit: Payer: Self-pay

## 2022-09-21 ENCOUNTER — Other Ambulatory Visit (HOSPITAL_COMMUNITY): Payer: Self-pay

## 2022-09-26 ENCOUNTER — Other Ambulatory Visit (HOSPITAL_COMMUNITY): Payer: Self-pay

## 2022-09-27 ENCOUNTER — Other Ambulatory Visit: Payer: Self-pay

## 2022-09-27 ENCOUNTER — Other Ambulatory Visit (HOSPITAL_COMMUNITY): Payer: Self-pay

## 2022-09-27 DIAGNOSIS — F411 Generalized anxiety disorder: Secondary | ICD-10-CM | POA: Diagnosis not present

## 2022-09-27 DIAGNOSIS — F900 Attention-deficit hyperactivity disorder, predominantly inattentive type: Secondary | ICD-10-CM | POA: Diagnosis not present

## 2022-09-27 DIAGNOSIS — F32A Depression, unspecified: Secondary | ICD-10-CM | POA: Diagnosis not present

## 2022-09-27 MED ORDER — CLONAZEPAM 0.5 MG PO TABS
0.5000 mg | ORAL_TABLET | Freq: Two times a day (BID) | ORAL | 2 refills | Status: DC
  Filled 2022-09-27: qty 60, 30d supply, fill #0
  Filled 2022-10-25: qty 60, 30d supply, fill #1

## 2022-09-27 MED ORDER — LAMOTRIGINE 100 MG PO TABS
100.0000 mg | ORAL_TABLET | Freq: Every day | ORAL | 3 refills | Status: DC
  Filled 2022-09-27: qty 30, 30d supply, fill #0
  Filled 2022-10-25: qty 30, 30d supply, fill #1
  Filled 2022-11-08 – 2023-02-06 (×4): qty 30, 30d supply, fill #2
  Filled 2023-05-08 – 2023-07-07 (×3): qty 30, 30d supply, fill #3

## 2022-10-03 ENCOUNTER — Encounter: Payer: Self-pay | Admitting: Gynecologic Oncology

## 2022-10-04 ENCOUNTER — Other Ambulatory Visit (HOSPITAL_COMMUNITY): Payer: Self-pay

## 2022-10-11 ENCOUNTER — Other Ambulatory Visit (HOSPITAL_COMMUNITY): Payer: Self-pay

## 2022-10-20 ENCOUNTER — Other Ambulatory Visit (HOSPITAL_COMMUNITY): Payer: Self-pay

## 2022-10-20 DIAGNOSIS — F32A Depression, unspecified: Secondary | ICD-10-CM | POA: Diagnosis not present

## 2022-10-20 DIAGNOSIS — F411 Generalized anxiety disorder: Secondary | ICD-10-CM | POA: Diagnosis not present

## 2022-10-20 DIAGNOSIS — F900 Attention-deficit hyperactivity disorder, predominantly inattentive type: Secondary | ICD-10-CM | POA: Diagnosis not present

## 2022-10-20 MED ORDER — AMPHETAMINE-DEXTROAMPHET ER 20 MG PO CP24
20.0000 mg | ORAL_CAPSULE | Freq: Every morning | ORAL | 0 refills | Status: DC
  Filled 2022-10-20: qty 30, 30d supply, fill #0

## 2022-10-20 MED ORDER — AMPHETAMINE-DEXTROAMPHET ER 20 MG PO CP24: 20.0000 mg | ORAL_CAPSULE | Freq: Every morning | ORAL | 0 refills | Status: DC

## 2022-10-25 ENCOUNTER — Other Ambulatory Visit: Payer: Self-pay

## 2022-10-25 ENCOUNTER — Other Ambulatory Visit (HOSPITAL_COMMUNITY): Payer: Self-pay

## 2022-11-08 ENCOUNTER — Other Ambulatory Visit (HOSPITAL_COMMUNITY): Payer: Self-pay

## 2022-11-09 ENCOUNTER — Other Ambulatory Visit: Payer: Self-pay

## 2022-11-10 ENCOUNTER — Other Ambulatory Visit (HOSPITAL_COMMUNITY): Payer: Self-pay

## 2022-11-10 ENCOUNTER — Other Ambulatory Visit: Payer: Self-pay

## 2022-11-10 MED ORDER — TRIAMCINOLONE ACETONIDE 0.1 % EX CREA
1.0000 | TOPICAL_CREAM | Freq: Two times a day (BID) | CUTANEOUS | 0 refills | Status: DC
  Filled 2022-11-10: qty 80, 30d supply, fill #0

## 2022-11-10 MED ORDER — ESZOPICLONE 2 MG PO TABS
2.0000 mg | ORAL_TABLET | Freq: Every evening | ORAL | 2 refills | Status: DC
  Filled 2022-11-10: qty 30, 30d supply, fill #0

## 2022-11-10 MED ORDER — QUETIAPINE FUMARATE 50 MG PO TABS
50.0000 mg | ORAL_TABLET | Freq: Every evening | ORAL | 1 refills | Status: DC
  Filled 2022-11-10: qty 180, 90d supply, fill #0
  Filled 2023-02-02 – 2023-02-06 (×2): qty 180, 90d supply, fill #1

## 2022-11-10 MED ORDER — DULOXETINE HCL 60 MG PO CPEP
60.0000 mg | ORAL_CAPSULE | Freq: Every day | ORAL | 1 refills | Status: DC
  Filled 2022-11-10: qty 90, 90d supply, fill #0
  Filled 2023-02-02 – 2023-02-06 (×2): qty 90, 90d supply, fill #1

## 2022-11-21 ENCOUNTER — Other Ambulatory Visit (HOSPITAL_COMMUNITY): Payer: Self-pay

## 2022-11-21 DIAGNOSIS — F411 Generalized anxiety disorder: Secondary | ICD-10-CM | POA: Diagnosis not present

## 2022-11-21 DIAGNOSIS — Z6830 Body mass index (BMI) 30.0-30.9, adult: Secondary | ICD-10-CM | POA: Diagnosis not present

## 2022-11-21 DIAGNOSIS — F32A Depression, unspecified: Secondary | ICD-10-CM | POA: Diagnosis not present

## 2022-11-21 DIAGNOSIS — I10 Essential (primary) hypertension: Secondary | ICD-10-CM | POA: Diagnosis not present

## 2022-11-21 DIAGNOSIS — F900 Attention-deficit hyperactivity disorder, predominantly inattentive type: Secondary | ICD-10-CM | POA: Diagnosis not present

## 2022-11-21 DIAGNOSIS — Z713 Dietary counseling and surveillance: Secondary | ICD-10-CM | POA: Diagnosis not present

## 2022-11-21 MED ORDER — ZEPBOUND 10 MG/0.5ML ~~LOC~~ SOAJ
10.0000 mg | SUBCUTANEOUS | 0 refills | Status: DC
  Filled 2022-11-21: qty 2, 28d supply, fill #0

## 2022-11-21 MED ORDER — CLONAZEPAM 0.5 MG PO TABS
0.5000 mg | ORAL_TABLET | Freq: Two times a day (BID) | ORAL | 2 refills | Status: DC
  Filled 2022-11-21 – 2022-11-22 (×2): qty 60, 30d supply, fill #0

## 2022-11-21 MED ORDER — AMPHETAMINE-DEXTROAMPHET ER 20 MG PO CP24: 20.0000 mg | ORAL_CAPSULE | Freq: Every morning | ORAL | 0 refills | Status: DC

## 2022-11-21 MED ORDER — LAMOTRIGINE 100 MG PO TABS
100.0000 mg | ORAL_TABLET | Freq: Every day | ORAL | 5 refills | Status: DC
  Filled 2022-11-21: qty 30, 30d supply, fill #0
  Filled 2023-01-12: qty 30, 30d supply, fill #1
  Filled 2023-03-08: qty 30, 30d supply, fill #2
  Filled 2023-10-31: qty 30, 30d supply, fill #3

## 2022-11-21 MED ORDER — AMPHETAMINE-DEXTROAMPHET ER 20 MG PO CP24
20.0000 mg | ORAL_CAPSULE | Freq: Every morning | ORAL | 0 refills | Status: DC
  Filled 2022-11-21: qty 30, 30d supply, fill #0

## 2022-11-22 ENCOUNTER — Other Ambulatory Visit (HOSPITAL_COMMUNITY): Payer: Self-pay

## 2022-11-22 ENCOUNTER — Other Ambulatory Visit: Payer: Self-pay

## 2022-11-24 ENCOUNTER — Other Ambulatory Visit (HOSPITAL_COMMUNITY): Payer: Self-pay

## 2022-12-19 ENCOUNTER — Other Ambulatory Visit (HOSPITAL_COMMUNITY): Payer: Self-pay

## 2022-12-19 DIAGNOSIS — F411 Generalized anxiety disorder: Secondary | ICD-10-CM | POA: Diagnosis not present

## 2022-12-19 DIAGNOSIS — F32A Depression, unspecified: Secondary | ICD-10-CM | POA: Diagnosis not present

## 2022-12-19 DIAGNOSIS — F900 Attention-deficit hyperactivity disorder, predominantly inattentive type: Secondary | ICD-10-CM | POA: Diagnosis not present

## 2022-12-19 MED ORDER — ESZOPICLONE 2 MG PO TABS
2.0000 mg | ORAL_TABLET | Freq: Every day | ORAL | 2 refills | Status: DC
Start: 2022-12-19 — End: 2023-11-23
  Filled 2022-12-19: qty 30, 30d supply, fill #0
  Filled 2023-01-27: qty 30, 30d supply, fill #1
  Filled 2023-03-08: qty 30, 30d supply, fill #2

## 2022-12-19 MED ORDER — AMPHETAMINE-DEXTROAMPHET ER 20 MG PO CP24
20.0000 mg | ORAL_CAPSULE | Freq: Every morning | ORAL | 0 refills | Status: DC
Start: 2022-12-19 — End: 2023-02-15
  Filled 2022-12-19: qty 30, 30d supply, fill #0

## 2022-12-19 MED ORDER — CLONAZEPAM 0.5 MG PO TABS
0.5000 mg | ORAL_TABLET | Freq: Two times a day (BID) | ORAL | 2 refills | Status: DC
Start: 2022-12-19 — End: 2023-02-15
  Filled 2022-12-19: qty 60, 30d supply, fill #0
  Filled 2023-02-02 – 2023-02-06 (×2): qty 60, 30d supply, fill #1

## 2022-12-19 MED ORDER — AMPHETAMINE-DEXTROAMPHET ER 20 MG PO CP24
20.0000 mg | ORAL_CAPSULE | Freq: Every morning | ORAL | 0 refills | Status: DC
Start: 2023-01-17 — End: 2023-02-15
  Filled 2023-01-27: qty 30, 30d supply, fill #0

## 2022-12-19 MED ORDER — AMPHETAMINE-DEXTROAMPHET ER 20 MG PO CP24
20.0000 mg | ORAL_CAPSULE | Freq: Every morning | ORAL | 0 refills | Status: DC
  Filled 2023-03-08 – 2023-04-09 (×2): qty 30, 30d supply, fill #0

## 2022-12-22 ENCOUNTER — Other Ambulatory Visit (HOSPITAL_COMMUNITY): Payer: Self-pay

## 2023-01-12 ENCOUNTER — Other Ambulatory Visit (HOSPITAL_COMMUNITY): Payer: Self-pay

## 2023-01-25 ENCOUNTER — Ambulatory Visit: Payer: Commercial Managed Care - PPO | Admitting: Cardiovascular Disease

## 2023-01-27 ENCOUNTER — Other Ambulatory Visit (HOSPITAL_COMMUNITY): Payer: Self-pay

## 2023-01-28 ENCOUNTER — Encounter (HOSPITAL_COMMUNITY): Payer: Self-pay

## 2023-01-28 ENCOUNTER — Other Ambulatory Visit (HOSPITAL_COMMUNITY): Payer: Self-pay

## 2023-01-31 ENCOUNTER — Other Ambulatory Visit: Payer: Self-pay

## 2023-01-31 ENCOUNTER — Other Ambulatory Visit (HOSPITAL_COMMUNITY): Payer: Self-pay

## 2023-02-02 ENCOUNTER — Other Ambulatory Visit (HOSPITAL_COMMUNITY): Payer: Self-pay

## 2023-02-06 ENCOUNTER — Other Ambulatory Visit: Payer: Self-pay

## 2023-02-07 ENCOUNTER — Other Ambulatory Visit (HOSPITAL_COMMUNITY): Payer: Self-pay

## 2023-02-07 MED ORDER — AMPHETAMINE-DEXTROAMPHET ER 25 MG PO CP24
25.0000 mg | ORAL_CAPSULE | Freq: Every morning | ORAL | 0 refills | Status: DC
Start: 2023-02-07 — End: 2024-01-08
  Filled 2023-02-07: qty 30, 30d supply, fill #0

## 2023-02-08 ENCOUNTER — Other Ambulatory Visit (HOSPITAL_COMMUNITY): Payer: Self-pay

## 2023-02-13 ENCOUNTER — Encounter: Payer: Self-pay | Admitting: Cardiology

## 2023-02-13 ENCOUNTER — Ambulatory Visit: Payer: Commercial Managed Care - PPO | Attending: Cardiology | Admitting: Cardiology

## 2023-02-13 VITALS — BP 102/64 | HR 70 | Ht 69.0 in | Wt 209.2 lb

## 2023-02-13 DIAGNOSIS — Q8740 Marfan's syndrome, unspecified: Secondary | ICD-10-CM

## 2023-02-13 DIAGNOSIS — R55 Syncope and collapse: Secondary | ICD-10-CM | POA: Diagnosis not present

## 2023-02-13 DIAGNOSIS — E7849 Other hyperlipidemia: Secondary | ICD-10-CM | POA: Diagnosis not present

## 2023-02-13 NOTE — Progress Notes (Unsigned)
Cardiology Office Note:  .   Date:  02/15/2023  ID:  Heather Stewart, DOB 1986/01/29, MRN [id number] PCP: Heather Boyden, MD  [facility] Providers Cardiologist:  Heather Lemma, MD     Chief Complaint  Patient presents with   Follow-up    Reestablish primary cardiologist Dr. Dr. Shari Stewart left.  This is a ~1 year follow-up.  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  She presents here today at the request of Heather Boyden, MD.  Heather Stewart was last seen on October 23 by Heather Stewart.  She actually was originally seen by Dr. Raynelle Stewart in February 2022 for palpitations and syncope.  Zio patch monitor showed 4 beat run of "NSVT and 2 PAT runs with rare isolated PACs and PVCs.  Echo essentially normal.  In her follow-up visit with Heather Stewart she noted history of Marfan's.  (Ultimately had genetic testing and was positive for FBN1 (c.8491del (p.Ile2831Serfs*15)) as well as variants of uncertain significance in two genes: DCHS1 (c.8624G>A (p.Arg2875Gln)) and FOXE3 (c.880G>A (p.Ala294Thr). She is followed by Dr. Roetta Stewart. ) => had some chest pain intermittently that radiated back and shoulder.  Mostly musculoskeletal.  Had not had a CT scan since 2009.  Was noting a couple episodes of tachycardia with heart rates up to the 120s to 140s. Checked a CTA AORTA  Plan will be to continue with MRA scans.  Echo updated. MRA chest ordered - ? For november     Subjective  INTERVAL HISTORY Heather Stewart presents here today for establishing new cardiology care mostly because her father is a patient of mine.  She has had a CTA chest and an echo done having  echoes done since she was roughly 19.  She says that other than having her heart race intermittently for shorter periods she really does not have that much of any cardiac symptoms.  No chest pain or pressure at rest exertion.  She is somewhat deconditioned and gets worse exertional dyspnea.  She is pretty frustrated that she done really good job having lost 36 pounds while on a different variation of a GLP-1 agonist but ever since our coverage plan stop carrying these medications, she has gained back 7 pounds.  She is somewhat frustrated by this.  As a result she does get somewhat fatigued.  Even though she feels her heart rate going up fast, she denies any syncope or near-syncope.  No chest pain or pressure at rest exertion.  No PND, orthopnea or edema.  Other than some vision issues from her lens detachments, she does not necessarily notice any dizziness or wooziness.  No unsteady gait.   ROS:  Review of Systems - Negative except somewhat adversely affected by her meds attachment with some defervescent issues.  Mild musculoskeletal symptoms.  No recurrent UTI issues no IBS issues.     Objective   Studies Reviewed: Heather Stewart   EKG Interpretation Date/Time:  Monday February 13 2023 15:46:24 EDT Ventricular Rate:  79 PR Interval:  156 QRS Duration:  86 QT Interval:  392 QTC Calculation: 449 R Axis:   118  Text Interpretation: Normal sinus rhythm Possible Left atrial enlargement Left  posterior fascicular block When compared with ECG of 02-Mar-2021 19:33, No significant change since last tracing Confirmed by Heather Stewart ([id number]) on 02/13/2023 4:01:43 PM   CTA Aorta 04/02/2022: No evidence of aortic aneurysm, dissection or other abnormality.  No acute CP process. ECHO: 05/05/22 1. Left ventricular ejection fraction, by estimation, is 60 to 65% . The left ventricle has normal function. The left ventricle has no regional wall motion abnormalities. Left ventricular diastolic parameters were normal. The  average left ventricular global longitudinal strain is - 22. 1 % . 2. Right ventricular systolic function is normal. The right ventricular size is normal. There is normal pulmonary artery systolic pressure. The estimated right ventricular systolic pressure is 16. 8 mmHg. 3. The mitral valve is normal in structure. Trivial mitral valve regurgitation. No evidence of mitral stenosis. 4. The aortic valve was not well visualized. Aortic valve regurgitation is trivial. No aortic stenosis is present. 5. The inferior vena cava is normal in size with greater than 50% respiratory variability, suggesting right atrial pressure of 3 mmHg.[name] Monitor 07/2020: Patient had a minimum heart rate of 49 bpm, maximum heart rate of 175 bpm, and average heart rate of 87 bpm. Predominant underlying rhythm was sinus rhythm. One run of nonsustained ventricular tachycardia occurred lasting 4 beats at longest with a max rate of 162 bpm at fastest. Two runs of supreventricular tachycardia occurred lasting 5 beats at longest with a max rate of 169 bpm at fastest. Isolated PACs were rare (<1.0%), with rare couplets. Isolated PVCs were rare (<1.0%), with rare bigeminy. No evidence of complete heart block. Triggered and diary events associated with sinus rhythm, sinus bradycardia,and sinus tachycardia.   Risk Assessment/Calculations:       Physical Exam:   VS:  BP 102/64   Pulse 70   Ht 5\' 9"  (1.753 m)   Wt 209 lb 3.2 oz (94.9 kg)   LMP 02/01/2023   SpO2 100%   BMI 30.89 kg/m    Wt Readings from Last 3 Encounters:  02/13/23 209 lb 3.2 oz (94.9 kg)  07/07/22 210 lb (95.3 kg)  07/07/22 209 lb (94.8 kg)    GEN: mildly obese-mostly because she is somewhat tall.  Seems well-nourished well-groomed.in no acute distress NECK: No JVD; No carotid bruits CARDIAC: Normal S1, S2; RRR, no murmurs, rubs, gallops RESPIRATORY:  Clear to auscultation without rales, wheezing or rhonchi ; nonlabored, good air movement. ABDOMEN:  Soft, non-tender, non-distended EXTREMITIES:  No edema; No deformity      ASSESSMENT AND PLAN: .    Problem List Items Addressed This Visit       Cardiology Problems   Hyperlipidemia Stewart to dietary fat intake (Chronic)    Has not had labs checked in a while.  Over 1 year.  Will reassess.  LDL is 129 last year in May.  May very well benefit from lipid management.  If elevated, would probably consider low-dose simvastatin.      Relevant Orders   Lipid panel   Comprehensive metabolic panel   Hemoglobin A1c   ECHOCARDIOGRAM COMPLETE     Other   Marfan syndrome - Primary (Chronic)    Stewart for follow-up surveillance screening with MRA chest and 2D echo this fall.  So far everything has been relatively reassuring.      Relevant Orders   EKG 12-Lead (Completed)   Lipid panel   Comprehensive metabolic panel   Hemoglobin A1c   ECHOCARDIOGRAM COMPLETE   Syncope and collapse    As  far as he can tell, no further recurrent episodes.               Dispo: No follow-ups on file.  Total time spent: 24 min spent with patient + 22 min spent charting = 46 min    Signed, Marykay Lex, MD, MS Heather Stewart, M.D., M.S. Interventional Cardiologist  [facility]  Pager # [phone] Phone # [phone] [address] [place], [place] [address]

## 2023-02-13 NOTE — Patient Instructions (Addendum)
Medication Instructions:  No changes   *If you need a refill on your cardiac medications before your next appointment, please call your pharmacy*   Lab Work: Lipid CMP HgbA1c If you have labs (blood work) drawn today and your tests are completely normal, you will receive your results only by: MyChart Message (if you have MyChart) OR A paper copy in the mail If you have any lab test that is abnormal or we need to change your treatment, we will call you to review the results.   Testing/Procedures:  Will be schedule in Nov/Dec 2024  at [facility] [address] Your physician has requested that you have an echocardiogram. Echocardiography is a painless test that uses sound waves to create images of your heart. It provides your doctor with information about the size and shape of your heart and how well your heart's chambers and valves are working. This procedure takes approximately one hour. There are no restrictions for this procedure. Please do NOT wear cologne, perfume, aftershave, or lotions (deodorant is allowed). Please arrive 15 minutes prior to your appointment time.  And  MRA should be schedule In Nov 2024  at Radiology dept.  Follow-Up: At [facility], you and your health needs are our priority.  As part of our continuing mission to provide you with exceptional heart care, we have created designated Provider Care Teams.  These Care Teams include your primary Cardiologist (physician) and Advanced Practice Providers (APPs -  Physician Assistants and Nurse Practitioners) who all work together to provide you with the care you need, when you need it.     Your next appointment:   12 month(s)  The format for your next appointment:   In Person  Provider:   Bryan Lemma, MD    Other Instructions

## 2023-02-15 ENCOUNTER — Encounter: Payer: Self-pay | Admitting: Cardiology

## 2023-02-15 NOTE — Assessment & Plan Note (Signed)
As far as he can tell, no further recurrent episodes.

## 2023-02-15 NOTE — Assessment & Plan Note (Signed)
Due for follow-up surveillance screening with MRA chest and 2D echo this fall.  So far everything has been relatively reassuring.

## 2023-02-15 NOTE — Assessment & Plan Note (Signed)
Has not had labs checked in a while.  Over 1 year.  Will reassess.  LDL is 129 last year in May.  May very well benefit from lipid management.  If elevated, would probably consider low-dose simvastatin.

## 2023-03-01 ENCOUNTER — Other Ambulatory Visit (HOSPITAL_COMMUNITY): Payer: Self-pay

## 2023-03-01 DIAGNOSIS — N898 Other specified noninflammatory disorders of vagina: Secondary | ICD-10-CM | POA: Diagnosis not present

## 2023-03-01 DIAGNOSIS — Z113 Encounter for screening for infections with a predominantly sexual mode of transmission: Secondary | ICD-10-CM | POA: Diagnosis not present

## 2023-03-01 MED ORDER — CLOBETASOL PROPIONATE 0.05 % EX OINT
TOPICAL_OINTMENT | Freq: Two times a day (BID) | CUTANEOUS | 0 refills | Status: DC
  Filled 2023-03-01: qty 15, 5d supply, fill #0

## 2023-03-02 ENCOUNTER — Other Ambulatory Visit (HOSPITAL_COMMUNITY): Payer: Self-pay

## 2023-03-02 ENCOUNTER — Other Ambulatory Visit: Payer: Self-pay

## 2023-03-02 MED ORDER — FLUCONAZOLE 150 MG PO TABS
150.0000 mg | ORAL_TABLET | ORAL | 0 refills | Status: DC
  Filled 2023-03-02: qty 2, 6d supply, fill #0

## 2023-03-02 MED ORDER — AMPICILLIN 500 MG PO CAPS
500.0000 mg | ORAL_CAPSULE | Freq: Two times a day (BID) | ORAL | 5 refills | Status: DC
  Filled 2023-03-02 – 2023-03-14 (×2): qty 60, 30d supply, fill #0
  Filled 2023-05-08: qty 60, 30d supply, fill #1
  Filled 2023-06-09: qty 60, 30d supply, fill #2
  Filled 2023-07-07 – 2023-10-31 (×3): qty 60, 30d supply, fill #3

## 2023-03-02 MED ORDER — TERCONAZOLE 0.4 % VA CREA
1.0000 | TOPICAL_CREAM | Freq: Every day | VAGINAL | 0 refills | Status: DC
  Filled 2023-03-02: qty 45, 7d supply, fill #0

## 2023-03-08 ENCOUNTER — Other Ambulatory Visit (HOSPITAL_COMMUNITY): Payer: Self-pay

## 2023-03-10 ENCOUNTER — Other Ambulatory Visit: Payer: Self-pay | Admitting: Obstetrics & Gynecology

## 2023-03-10 ENCOUNTER — Other Ambulatory Visit (HOSPITAL_COMMUNITY): Payer: Self-pay

## 2023-03-10 DIAGNOSIS — Z803 Family history of malignant neoplasm of breast: Secondary | ICD-10-CM

## 2023-03-10 MED ORDER — ALBUTEROL SULFATE HFA 108 (90 BASE) MCG/ACT IN AERS
INHALATION_SPRAY | RESPIRATORY_TRACT | 1 refills | Status: AC
  Filled 2023-03-10: qty 6.7, 30d supply, fill #0
  Filled 2023-05-08: qty 6.7, 30d supply, fill #1

## 2023-03-10 MED ORDER — DOXYCYCLINE HYCLATE 100 MG PO TABS
100.0000 mg | ORAL_TABLET | Freq: Two times a day (BID) | ORAL | 0 refills | Status: DC
Start: 2022-08-27 — End: 2023-05-08
  Filled 2023-03-10: qty 14, 7d supply, fill #0

## 2023-03-10 MED ORDER — METHYLPREDNISOLONE 4 MG PO TBPK
ORAL_TABLET | ORAL | 0 refills | Status: DC
  Filled 2023-03-10: qty 21, 6d supply, fill #0

## 2023-03-14 ENCOUNTER — Other Ambulatory Visit (HOSPITAL_COMMUNITY): Payer: Self-pay

## 2023-03-14 MED ORDER — AMPHETAMINE-DEXTROAMPHET ER 25 MG PO CP24
25.0000 mg | ORAL_CAPSULE | Freq: Every morning | ORAL | 0 refills | Status: DC
Start: 2023-03-14 — End: 2024-01-08
  Filled 2023-03-14: qty 30, 30d supply, fill #0

## 2023-03-17 ENCOUNTER — Other Ambulatory Visit (HOSPITAL_COMMUNITY): Payer: Self-pay

## 2023-03-20 DIAGNOSIS — Z6830 Body mass index (BMI) 30.0-30.9, adult: Secondary | ICD-10-CM | POA: Diagnosis not present

## 2023-03-20 DIAGNOSIS — Z1329 Encounter for screening for other suspected endocrine disorder: Secondary | ICD-10-CM | POA: Diagnosis not present

## 2023-03-20 DIAGNOSIS — Z131 Encounter for screening for diabetes mellitus: Secondary | ICD-10-CM | POA: Diagnosis not present

## 2023-03-20 DIAGNOSIS — Z1151 Encounter for screening for human papillomavirus (HPV): Secondary | ICD-10-CM | POA: Diagnosis not present

## 2023-03-20 DIAGNOSIS — N760 Acute vaginitis: Secondary | ICD-10-CM | POA: Diagnosis not present

## 2023-03-20 DIAGNOSIS — Z01419 Encounter for gynecological examination (general) (routine) without abnormal findings: Secondary | ICD-10-CM | POA: Diagnosis not present

## 2023-03-20 DIAGNOSIS — Z13228 Encounter for screening for other metabolic disorders: Secondary | ICD-10-CM | POA: Diagnosis not present

## 2023-03-20 DIAGNOSIS — Z13220 Encounter for screening for lipoid disorders: Secondary | ICD-10-CM | POA: Diagnosis not present

## 2023-03-20 DIAGNOSIS — Z124 Encounter for screening for malignant neoplasm of cervix: Secondary | ICD-10-CM | POA: Diagnosis not present

## 2023-03-21 ENCOUNTER — Other Ambulatory Visit (HOSPITAL_COMMUNITY): Payer: Self-pay

## 2023-03-21 DIAGNOSIS — F411 Generalized anxiety disorder: Secondary | ICD-10-CM | POA: Diagnosis not present

## 2023-03-21 DIAGNOSIS — F32A Depression, unspecified: Secondary | ICD-10-CM | POA: Diagnosis not present

## 2023-03-21 DIAGNOSIS — F900 Attention-deficit hyperactivity disorder, predominantly inattentive type: Secondary | ICD-10-CM | POA: Diagnosis not present

## 2023-03-21 MED ORDER — ESZOPICLONE 2 MG PO TABS
2.0000 mg | ORAL_TABLET | Freq: Every evening | ORAL | 3 refills | Status: DC
  Filled 2023-04-09: qty 30, 30d supply, fill #0
  Filled 2023-05-08: qty 30, 30d supply, fill #1
  Filled 2023-06-09: qty 30, 30d supply, fill #2
  Filled 2023-07-07: qty 30, 30d supply, fill #3

## 2023-03-21 MED ORDER — AMPHETAMINE-DEXTROAMPHET ER 25 MG PO CP24
25.0000 mg | ORAL_CAPSULE | Freq: Every morning | ORAL | 0 refills | Status: DC
  Filled 2023-07-07: qty 30, 30d supply, fill #0

## 2023-03-21 MED ORDER — QUETIAPINE FUMARATE 50 MG PO TABS
50.0000 mg | ORAL_TABLET | Freq: Every day | ORAL | 1 refills | Status: DC
  Filled 2023-04-09 – 2023-05-01 (×2): qty 180, 90d supply, fill #0
  Filled 2023-07-07 – 2023-08-04 (×3): qty 180, 90d supply, fill #1

## 2023-03-21 MED ORDER — LAMOTRIGINE 100 MG PO TABS
100.0000 mg | ORAL_TABLET | Freq: Every day | ORAL | 5 refills | Status: DC
  Filled 2023-04-09: qty 30, 30d supply, fill #0
  Filled 2023-05-08: qty 30, 30d supply, fill #1
  Filled 2023-06-09: qty 30, 30d supply, fill #2
  Filled 2023-07-07 – 2023-08-04 (×2): qty 30, 30d supply, fill #3

## 2023-03-21 MED ORDER — AMPHETAMINE-DEXTROAMPHET ER 25 MG PO CP24
25.0000 mg | ORAL_CAPSULE | Freq: Every morning | ORAL | 0 refills | Status: DC
  Filled 2023-04-12: qty 30, 30d supply, fill #0

## 2023-03-21 MED ORDER — DULOXETINE HCL 60 MG PO CPEP
60.0000 mg | ORAL_CAPSULE | Freq: Every day | ORAL | 1 refills | Status: DC
  Filled 2023-04-09 – 2023-05-01 (×2): qty 90, 90d supply, fill #0
  Filled 2023-07-07 – 2023-08-04 (×3): qty 90, 90d supply, fill #1

## 2023-03-21 MED ORDER — CLONAZEPAM 0.5 MG PO TABS
0.5000 mg | ORAL_TABLET | Freq: Two times a day (BID) | ORAL | 2 refills | Status: DC
  Filled 2023-03-21 – 2023-05-08 (×2): qty 60, 30d supply, fill #0
  Filled 2023-08-04: qty 60, 30d supply, fill #1

## 2023-03-21 MED ORDER — AMPHETAMINE-DEXTROAMPHET ER 25 MG PO CP24
25.0000 mg | ORAL_CAPSULE | Freq: Every morning | ORAL | 0 refills | Status: DC
  Filled 2023-05-15: qty 30, 30d supply, fill #0

## 2023-04-04 ENCOUNTER — Other Ambulatory Visit (HOSPITAL_COMMUNITY): Payer: Self-pay

## 2023-04-05 ENCOUNTER — Ambulatory Visit (HOSPITAL_COMMUNITY)
Admission: RE | Admit: 2023-04-05 | Discharge: 2023-04-05 | Disposition: A | Discharge status: Home / Self Care | Payer: Commercial Managed Care - PPO | Source: Ambulatory Visit | Attending: Cardiology | Admitting: Cardiology

## 2023-04-05 DIAGNOSIS — Q8740 Marfan's syndrome, unspecified: Secondary | ICD-10-CM | POA: Diagnosis not present

## 2023-04-05 DIAGNOSIS — R2991 Unspecified symptoms and signs involving the musculoskeletal system: Secondary | ICD-10-CM | POA: Insufficient documentation

## 2023-04-05 MED ORDER — GADOBUTROL 1 MMOL/ML IV SOLN
9.0000 mL | Freq: Once | INTRAVENOUS | Status: AC | PRN
  Administered 2023-04-05: 9 mL via INTRAVENOUS

## 2023-04-10 ENCOUNTER — Other Ambulatory Visit: Payer: Self-pay

## 2023-04-10 ENCOUNTER — Other Ambulatory Visit (HOSPITAL_COMMUNITY): Payer: Self-pay

## 2023-04-11 ENCOUNTER — Other Ambulatory Visit (HOSPITAL_COMMUNITY): Payer: Self-pay

## 2023-04-12 ENCOUNTER — Other Ambulatory Visit (HOSPITAL_COMMUNITY): Payer: Self-pay

## 2023-04-18 ENCOUNTER — Encounter: Payer: Self-pay | Admitting: Obstetrics & Gynecology

## 2023-04-21 ENCOUNTER — Other Ambulatory Visit (HOSPITAL_COMMUNITY): Payer: Self-pay

## 2023-04-22 ENCOUNTER — Other Ambulatory Visit: Payer: Commercial Managed Care - PPO

## 2023-05-01 ENCOUNTER — Other Ambulatory Visit (HOSPITAL_COMMUNITY): Payer: Self-pay

## 2023-05-01 ENCOUNTER — Other Ambulatory Visit: Payer: Self-pay

## 2023-05-08 ENCOUNTER — Ambulatory Visit (HOSPITAL_COMMUNITY): Payer: Commercial Managed Care - PPO | Attending: Cardiology

## 2023-05-08 ENCOUNTER — Ambulatory Visit (INDEPENDENT_AMBULATORY_CARE_PROVIDER_SITE_OTHER): Payer: Commercial Managed Care - PPO

## 2023-05-08 ENCOUNTER — Other Ambulatory Visit (HOSPITAL_COMMUNITY): Payer: Self-pay

## 2023-05-08 ENCOUNTER — Ambulatory Visit: Payer: Commercial Managed Care - PPO | Admitting: Family Medicine

## 2023-05-08 ENCOUNTER — Other Ambulatory Visit: Payer: Self-pay

## 2023-05-08 VITALS — BP 126/84 | HR 88 | Ht 69.0 in | Wt 205.0 lb

## 2023-05-08 DIAGNOSIS — M438X6 Other specified deforming dorsopathies, lumbar region: Secondary | ICD-10-CM | POA: Diagnosis not present

## 2023-05-08 DIAGNOSIS — Q8740 Marfan's syndrome, unspecified: Secondary | ICD-10-CM

## 2023-05-08 DIAGNOSIS — M545 Low back pain, unspecified: Secondary | ICD-10-CM

## 2023-05-08 DIAGNOSIS — M47816 Spondylosis without myelopathy or radiculopathy, lumbar region: Secondary | ICD-10-CM | POA: Diagnosis not present

## 2023-05-08 DIAGNOSIS — E7849 Other hyperlipidemia: Secondary | ICD-10-CM

## 2023-05-08 DIAGNOSIS — M419 Scoliosis, unspecified: Secondary | ICD-10-CM | POA: Diagnosis not present

## 2023-05-08 LAB — ECHOCARDIOGRAM COMPLETE
Area-P 1/2: 2.93 cm2
S' Lateral: 2.4 cm

## 2023-05-08 MED ORDER — CYCLOBENZAPRINE HCL 10 MG PO TABS
10.0000 mg | ORAL_TABLET | Freq: Three times a day (TID) | ORAL | 1 refills | Status: DC | PRN
  Filled 2023-05-08: qty 30, 10d supply, fill #0

## 2023-05-08 NOTE — Progress Notes (Signed)
   Rubin Payor, PhD, LAT, ATC acting as a scribe for Clementeen Graham, MD.  Heather Stewart is a 37 y.o. female who presents to [facility] today for back pain ongoing since Saturday. She was putting on her shoes when the pain started. Pt locates pain to the L-side of her low back and into L buttock. She is a Investment banker, operational in the ED.   Radiating pain: no LE numbness/tingling: no LE weakness: no Aggravates: sitting, walking, bending Treatments tried: TENS, ice, muscle relaxer, IBU, Tylenol  Dx imaging: 11/12/21 L-spine MRI  Pertinent review of systems: No fevers or chills  Relevant historical information: Marfan's   Exam:  BP 126/84   Pulse 88   Ht 5\' 9"  (1.753 m)   Wt 205 lb (93 kg)   SpO2 97%   BMI 30.27 kg/m  General: Well Developed, well nourished, and in no acute distress.   MSK: L-spine: Normal appearing Nontender palpation spinal midline.  Tender palpation left paraspinal musculature. Decreased lumbar motion. Lower extremity strength is intact.    Lab and Radiology Results  X-ray images lumbar spine obtained today personally and independently interpreted. DDD L1-2.  Mild scoliosis.  No acute fractures. Await formal radiology review    Assessment and Plan: 37 y.o. female with acute exacerbation of chronic low back pain occurring the setting of Marfan syndrome.  Pain today due to muscle spasm and dysfunction.  She is a great candidate for physical therapy especially dry needling now and core strengthening when feeling better.  We talked about medication options will maximize ibuprofen and Tylenol and use cyclobenzaprine.  We talked about opiates and decided against it for now.  PT should be extremely helpful but she should avoid chiropractor manipulation if possible given Marfan syndrome.   PDMP not reviewed this encounter. Orders Placed This Encounter  Procedures   DG Lumbar Spine 2-3 Views    Standing Status:   Future    Number of  Occurrences:   1    Standing Expiration Date:   06/08/2023    Order Specific Question:   Reason for Exam (SYMPTOM  OR DIAGNOSIS REQUIRED)    Answer:   low back pain    Order Specific Question:   Preferred imaging location?    Answer:   [facility]    Order Specific Question:   Is patient pregnant?    Answer:   No   Ambulatory referral to Physical Therapy    Referral Priority:   Routine    Referral Type:   Physical Medicine    Referral Reason:   Specialty Services Required    Requested Specialty:   Physical Therapy    Number of Visits Requested:   1   Meds ordered this encounter  Medications   cyclobenzaprine (FLEXERIL) 10 MG tablet    Sig: Take 1 tablet (10 mg total) by mouth 3 (three) times daily as needed for muscle spasms.    Dispense:  30 tablet    Refill:  1     Discussed warning signs or symptoms. Please see discharge instructions. Patient expresses understanding.   The above documentation has been reviewed and is accurate and complete Clementeen Graham, M.D.

## 2023-05-08 NOTE — Patient Instructions (Addendum)
Thank you for coming in today.   I've referred you to Physical Therapy.  Let us know if you don't hear from them in one week.   I've sent a prescription for Flexeril to your pharmacy.   Check back in 1 month  Please get an Xray today before you leave

## 2023-05-10 ENCOUNTER — Encounter: Payer: Self-pay | Admitting: Family Medicine

## 2023-05-10 ENCOUNTER — Telehealth: Payer: Self-pay

## 2023-05-10 DIAGNOSIS — M6281 Muscle weakness (generalized): Secondary | ICD-10-CM | POA: Diagnosis not present

## 2023-05-10 DIAGNOSIS — M4126 Other idiopathic scoliosis, lumbar region: Secondary | ICD-10-CM | POA: Diagnosis not present

## 2023-05-10 DIAGNOSIS — R2689 Other abnormalities of gait and mobility: Secondary | ICD-10-CM | POA: Diagnosis not present

## 2023-05-10 DIAGNOSIS — M4125 Other idiopathic scoliosis, thoracolumbar region: Secondary | ICD-10-CM | POA: Diagnosis not present

## 2023-05-10 NOTE — Telephone Encounter (Signed)
[employer] FMLA form received Leave number [id number]

## 2023-05-10 NOTE — Telephone Encounter (Signed)
Forwarding to Dr. Corey to review and advise.  

## 2023-05-11 ENCOUNTER — Encounter: Payer: Self-pay | Admitting: Cardiology

## 2023-05-11 DIAGNOSIS — M4126 Other idiopathic scoliosis, lumbar region: Secondary | ICD-10-CM | POA: Diagnosis not present

## 2023-05-11 DIAGNOSIS — R2689 Other abnormalities of gait and mobility: Secondary | ICD-10-CM | POA: Diagnosis not present

## 2023-05-11 DIAGNOSIS — M6281 Muscle weakness (generalized): Secondary | ICD-10-CM | POA: Diagnosis not present

## 2023-05-11 DIAGNOSIS — M4125 Other idiopathic scoliosis, thoracolumbar region: Secondary | ICD-10-CM | POA: Diagnosis not present

## 2023-05-11 NOTE — Telephone Encounter (Signed)
Form completed and placed on Dr. Corey's desk to review and sign.  

## 2023-05-11 NOTE — Telephone Encounter (Signed)
Not really - still in the ~normal range.  Doesn't look like Pulmonary HTN.   DH

## 2023-05-15 ENCOUNTER — Other Ambulatory Visit: Payer: Self-pay

## 2023-05-15 ENCOUNTER — Other Ambulatory Visit (HOSPITAL_COMMUNITY): Payer: Self-pay

## 2023-05-15 DIAGNOSIS — M6281 Muscle weakness (generalized): Secondary | ICD-10-CM | POA: Diagnosis not present

## 2023-05-15 DIAGNOSIS — M4125 Other idiopathic scoliosis, thoracolumbar region: Secondary | ICD-10-CM | POA: Diagnosis not present

## 2023-05-15 DIAGNOSIS — M4126 Other idiopathic scoliosis, lumbar region: Secondary | ICD-10-CM | POA: Diagnosis not present

## 2023-05-15 DIAGNOSIS — R2689 Other abnormalities of gait and mobility: Secondary | ICD-10-CM | POA: Diagnosis not present

## 2023-05-15 MED ORDER — PANTOPRAZOLE SODIUM 40 MG PO TBEC
40.0000 mg | DELAYED_RELEASE_TABLET | Freq: Every day | ORAL | 3 refills | Status: AC
  Filled 2023-05-15: qty 90, 90d supply, fill #0
  Filled 2023-07-07 – 2023-08-19 (×2): qty 90, 90d supply, fill #1
  Filled 2023-11-10: qty 90, 90d supply, fill #2
  Filled 2024-03-07: qty 90, 90d supply, fill #3

## 2023-05-15 NOTE — Telephone Encounter (Signed)
Paperwork successfully faxed to [employer], sent to scan.

## 2023-05-16 ENCOUNTER — Encounter: Payer: Self-pay | Admitting: Family Medicine

## 2023-05-16 NOTE — Progress Notes (Signed)
Low back x-ray shows a little bit of arthritis between L1 and L2.

## 2023-05-17 ENCOUNTER — Other Ambulatory Visit (HOSPITAL_COMMUNITY): Payer: Self-pay

## 2023-05-17 DIAGNOSIS — R2689 Other abnormalities of gait and mobility: Secondary | ICD-10-CM | POA: Diagnosis not present

## 2023-05-17 DIAGNOSIS — M4125 Other idiopathic scoliosis, thoracolumbar region: Secondary | ICD-10-CM | POA: Diagnosis not present

## 2023-05-17 DIAGNOSIS — M6281 Muscle weakness (generalized): Secondary | ICD-10-CM | POA: Diagnosis not present

## 2023-05-17 DIAGNOSIS — M4126 Other idiopathic scoliosis, lumbar region: Secondary | ICD-10-CM | POA: Diagnosis not present

## 2023-05-17 MED ORDER — HYDROCODONE-ACETAMINOPHEN 5-325 MG PO TABS
1.0000 | ORAL_TABLET | Freq: Four times a day (QID) | ORAL | 0 refills | Status: DC | PRN
  Filled 2023-05-17: qty 15, 4d supply, fill #0

## 2023-05-17 NOTE — Telephone Encounter (Signed)
Norco prescribed. Heather Stewart can you check on what we put for the FMLA? She may need for leave.

## 2023-05-22 DIAGNOSIS — M4125 Other idiopathic scoliosis, thoracolumbar region: Secondary | ICD-10-CM | POA: Diagnosis not present

## 2023-05-22 DIAGNOSIS — M4126 Other idiopathic scoliosis, lumbar region: Secondary | ICD-10-CM | POA: Diagnosis not present

## 2023-05-22 DIAGNOSIS — R2689 Other abnormalities of gait and mobility: Secondary | ICD-10-CM | POA: Diagnosis not present

## 2023-05-22 DIAGNOSIS — M6281 Muscle weakness (generalized): Secondary | ICD-10-CM | POA: Diagnosis not present

## 2023-06-01 ENCOUNTER — Other Ambulatory Visit (HOSPITAL_COMMUNITY): Payer: Self-pay

## 2023-06-02 NOTE — Progress Notes (Deleted)
   LILLETTE Ileana Collet, PhD, LAT, ATC acting as a scribe for Artist Lloyd, MD.  Heather Stewart is a 38 y.o. female who presents to [facility] at [facility] today for f/u LBP. Pt was last seen by Dr. Lloyd on 05/08/23 and was prescribed cyclobenzaprine  and advised to use Tylenol /IBU. She was also referred to [facility]. She was later prescribed hydrocodone .  Today, pt reports ***  Dx imaging: 05/08/23 L-spine XR 11/12/21 L-spine MRI   Pertinent review of systems: ***  Relevant historical information: ***   Exam:  LMP 04/27/2023 (Exact Date)  General: Well Developed, well nourished, and in no acute distress.   MSK: ***    Lab and Radiology Results No results found for this or any previous visit (from the past 72 hours). No results found.     Assessment and Plan: 38 y.o. female with ***   PDMP not reviewed this encounter. No orders of the defined types were placed in this encounter.  No orders of the defined types were placed in this encounter.    Discussed warning signs or symptoms. Please see discharge instructions. Patient expresses understanding.   ***

## 2023-06-05 ENCOUNTER — Ambulatory Visit: Payer: Commercial Managed Care - PPO | Admitting: Family Medicine

## 2023-06-09 ENCOUNTER — Other Ambulatory Visit: Payer: Self-pay

## 2023-06-09 ENCOUNTER — Encounter (HOSPITAL_COMMUNITY): Payer: Self-pay

## 2023-06-09 ENCOUNTER — Emergency Department (HOSPITAL_COMMUNITY)
Admission: EM | Admit: 2023-06-09 | Discharge: 2023-06-10 | Disposition: A | Discharge status: Home / Self Care | Payer: Commercial Managed Care - PPO | Attending: Emergency Medicine | Admitting: Emergency Medicine

## 2023-06-09 ENCOUNTER — Ambulatory Visit
Admission: RE | Admit: 2023-06-09 | Discharge: 2023-06-09 | Disposition: A | Discharge status: Home / Self Care | Payer: Commercial Managed Care - PPO | Source: Ambulatory Visit | Attending: Obstetrics & Gynecology | Admitting: Obstetrics & Gynecology

## 2023-06-09 ENCOUNTER — Other Ambulatory Visit (HOSPITAL_COMMUNITY): Payer: Self-pay

## 2023-06-09 DIAGNOSIS — R197 Diarrhea, unspecified: Secondary | ICD-10-CM | POA: Diagnosis not present

## 2023-06-09 DIAGNOSIS — D72829 Elevated white blood cell count, unspecified: Secondary | ICD-10-CM | POA: Diagnosis not present

## 2023-06-09 DIAGNOSIS — R112 Nausea with vomiting, unspecified: Secondary | ICD-10-CM | POA: Diagnosis not present

## 2023-06-09 DIAGNOSIS — Z1239 Encounter for other screening for malignant neoplasm of breast: Secondary | ICD-10-CM | POA: Diagnosis not present

## 2023-06-09 DIAGNOSIS — E860 Dehydration: Secondary | ICD-10-CM | POA: Diagnosis not present

## 2023-06-09 DIAGNOSIS — Z803 Family history of malignant neoplasm of breast: Secondary | ICD-10-CM

## 2023-06-09 LAB — COMPREHENSIVE METABOLIC PANEL
ALT: 22 U/L (ref 0–44)
AST: 21 U/L (ref 15–41)
Albumin: 4 g/dL (ref 3.5–5.0)
Alkaline Phosphatase: 56 U/L (ref 38–126)
Anion gap: 7 (ref 5–15)
BUN: 15 mg/dL (ref 6–20)
CO2: 20 mmol/L — ABNORMAL LOW (ref 22–32)
Calcium: 9.3 mg/dL (ref 8.9–10.3)
Chloride: 109 mmol/L (ref 98–111)
Creatinine, Ser: 0.76 mg/dL (ref 0.44–1.00)
GFR, Estimated: >60 mL/min (ref >60)
Glucose, Bld: 134 mg/dL — ABNORMAL HIGH (ref 70–99)
Potassium: 3.9 mmol/L (ref 3.5–5.1)
Sodium: 136 mmol/L (ref 135–145)
Total Bilirubin: 0.8 mg/dL (ref 0.0–1.2)
Total Protein: 7.3 g/dL (ref 6.5–8.1)

## 2023-06-09 LAB — CBC
HCT: 46.1 % — ABNORMAL HIGH (ref 36.0–46.0)
Hemoglobin: 16 g/dL — ABNORMAL HIGH (ref 12.0–15.0)
MCH: 30 pg (ref 26.0–34.0)
MCHC: 34.7 g/dL (ref 30.0–36.0)
MCV: 86.5 fL (ref 80.0–100.0)
Platelets: 319 10*3/uL (ref 150–400)
RBC: 5.33 MIL/uL — ABNORMAL HIGH (ref 3.87–5.11)
RDW: 12.2 % (ref 11.5–15.5)
WBC: 19.4 10*3/uL — ABNORMAL HIGH (ref 4.0–10.5)
nRBC: 0 % (ref 0.0–0.2)

## 2023-06-09 LAB — LIPASE, BLOOD: Lipase: 29 U/L (ref 11–51)

## 2023-06-09 MED ORDER — ONDANSETRON HCL 4 MG/2ML IJ SOLN
4.0000 mg | Freq: Once | INTRAMUSCULAR | Status: AC
  Administered 2023-06-09: 4 mg via INTRAVENOUS
  Filled 2023-06-09: qty 2

## 2023-06-09 MED ORDER — FENTANYL CITRATE PF 50 MCG/ML IJ SOSY
100.0000 ug | PREFILLED_SYRINGE | Freq: Once | INTRAMUSCULAR | Status: AC
  Administered 2023-06-09: 100 ug via INTRAVENOUS
  Filled 2023-06-09: qty 2

## 2023-06-09 MED ORDER — KETOROLAC TROMETHAMINE 15 MG/ML IJ SOLN
15.0000 mg | Freq: Once | INTRAMUSCULAR | Status: AC
  Administered 2023-06-10: 15 mg via INTRAVENOUS
  Filled 2023-06-09: qty 1

## 2023-06-09 MED ORDER — GADOPICLENOL 0.5 MMOL/ML IV SOLN
9.0000 mL | Freq: Once | INTRAVENOUS | Status: AC | PRN
  Administered 2023-06-09: 9 mL via INTRAVENOUS

## 2023-06-09 MED ORDER — SODIUM CHLORIDE 0.9 % IV BOLUS (SEPSIS)
1000.0000 mL | Freq: Once | INTRAVENOUS | Status: AC
  Administered 2023-06-09: 1000 mL via INTRAVENOUS

## 2023-06-09 NOTE — ED Provider Notes (Signed)
 [place] EMERGENCY DEPARTMENT AT [facility] Provider Note   CSN: [id number] Arrival date & time: 06/09/23  2245     History  Chief Complaint  Patient presents with   Abdominal Pain    Heather Stewart is a 38 y.o. female.  The history is provided by the patient.   Patient w/history of ADHD, previous cholecystectomy presents with nausea vomiting diarrhea and abdominal pain.  Patient reports she underwent MRI of her breast earlier in the day that she had a reaction to the contrast.  Soon after the imaging study she had a rash in her upper body.  This resolved spontaneously.  However around 6 PM she began having multiple episodes of nonbloody emesis and diarrhea.  She also reports sharp abdominal pain. No shortness of breath, no angioedema is reported   Past Medical History:  Diagnosis Date   Acne    ADHD (attention deficit hyperactivity disorder), combined type    dx as child and then received through psychiatry - reviewed records from psych: no recent w/u.  was on vyvanse  during nursing school   Adjustment disorder    Anxiety    Chronic back pain    thoracic and lumbar - s/p unrevealing NSG eval    Connective tissue disease (HCC)    ocular lens dislocation, flat feet, high palate, no cardiac involvement   Esophageal reflux    Eye problems 08/2011   subluxated lenses OU, rec test for homocyetinuria to r/o as cause (see scanned form)   Focal nodular hyperplasia of liver 11/10/2018   By MRI at [facility] 07/2018 - see scanned report   Hx of migraines    Irritable bowel syndrome    MDD (major depressive disorder), recurrent episode (HCC)    Pneumonia    Screening examination for pulmonary tuberculosis    Swelling, mass, or lump in head and neck    Uvulitis 10/02/2018    Home Medications Prior to Admission medications   Medication Sig Start Date End Date Taking? Authorizing Provider  dicyclomine  (BENTYL ) 20 MG tablet Take 1 tablet (20 mg total)  by mouth 2 (two) times daily. 06/10/23  Yes Midge Golas, MD  ondansetron  (ZOFRAN -ODT) 8 MG disintegrating tablet Take 1 tablet (8 mg total) by mouth every 8 (eight) hours as needed. 06/10/23  Yes Midge Golas, MD  albuterol  (VENTOLIN  HFA) 108 (90 Base) MCG/ACT inhaler Inhale 1 puff into the lungs as directed. 08/27/22   Harris, Abigail, PA-C  amphetamine -dextroamphetamine  (ADDERALL  XR) 20 MG 24 hr capsule Take 1 capsule (20 mg total) by mouth in the morning. 02/15/23     amphetamine -dextroamphetamine  (ADDERALL  XR) 25 MG 24 hr capsule Take 1 capsule by mouth every morning. Patient not taking: Reported on 02/13/2023 02/07/23     amphetamine -dextroamphetamine  (ADDERALL  XR) 25 MG 24 hr capsule Take 1 capsule by mouth in the morning. 03/14/23     amphetamine -dextroamphetamine  (ADDERALL  XR) 25 MG 24 hr capsule Take 1 capsule by mouth in the morning. 05/10/23     amphetamine -dextroamphetamine  (ADDERALL  XR) 25 MG 24 hr capsule Take 1 capsule by mouth in the morning. 04/12/23     amphetamine -dextroamphetamine  (ADDERALL  XR) 25 MG 24 hr capsule Take 1 capsule by mouth in the morning. 06/08/23     ampicillin  (PRINCIPEN) 500 MG capsule Take 1 capsule (500 mg total) by mouth 2 (two) times daily. 03/02/23     clobetasol  ointment (TEMOVATE ) 0.05 % Apply a thin layer to the affected area(s) topically 2 (two) times daily. 03/01/23  clonazePAM  (KLONOPIN ) 0.5 MG tablet Take 1 tablet (0.5 mg total) by mouth daily as needed. 06/29/22     clonazePAM  (KLONOPIN ) 0.5 MG tablet Take 1 tablet (0.5 mg total) by mouth 2 (two) times daily. 03/21/23     cyclobenzaprine  (FLEXERIL ) 10 MG tablet Take 1 tablet (10 mg total) by mouth 3 (three) times daily as needed for muscle spasms. 05/08/23   Corey, Evan S, MD  DULoxetine  (CYMBALTA ) 60 MG capsule Take 1 capsule (60 mg total) by mouth daily. 03/07/22     DULoxetine  (CYMBALTA ) 60 MG capsule Take 1 capsule (60 mg total) by mouth daily. 03/21/23     eszopiclone  (LUNESTA ) 2 MG TABS tablet  Take 1 tablet (2 mg total) by mouth at bedtime. 11/10/22     eszopiclone  (LUNESTA ) 2 MG TABS tablet Take 1 tablet (2 mg total) by mouth at bedtime. 12/19/22     eszopiclone  (LUNESTA ) 2 MG TABS tablet Take 1 tablet (2 mg total) by mouth at bedtime. 03/21/23     fluticasone  (FLONASE ) 50 MCG/ACT nasal spray Place 1 spray into both nostrils at bedtime. 07/07/22   Sood, Vineet, MD  HYDROcodone -acetaminophen  (NORCO/VICODIN) 5-325 MG tablet Take 1 tablet by mouth every 6 (six) hours as needed. 05/17/23   Joane Artist RAMAN, MD  lamoTRIgine  (LAMICTAL ) 100 MG tablet Take 1 tablet (100 mg total) by mouth daily. 09/27/22     lamoTRIgine  (LAMICTAL ) 100 MG tablet Take 1 tablet (100 mg total) by mouth daily. Patient not taking: Reported on 02/13/2023 11/21/22     lamoTRIgine  (LAMICTAL ) 100 MG tablet Take 1 tablet (100 mg total) by mouth daily. 03/21/23     methylPREDNISolone  (MEDROL  DOSEPAK) 4 MG TBPK tablet Use as directed 08/27/22   Harris, Abigail, PA-C  pantoprazole  (PROTONIX ) 40 MG tablet Take 1 tablet (40 mg total) by mouth daily. 05/15/23     QUEtiapine  (SEROQUEL ) 50 MG tablet Take 1-2 tablets (50-100 mg total) by mouth at bedtime. 03/21/23     triamcinolone  cream (KENALOG ) 0.1 % Apply a small amount to affected area topically 2 (two) times daily as needed. 11/10/22     ARIPiprazole  (ABILIFY ) 5 MG tablet Take 1 tablet (5 mg total) by mouth daily. Patient not taking: No sig reported 10/30/20 04/02/21        Allergies    Amoxicillin -pot clavulanate, Neosporin [neomycin -bacitracin zn-polymyx], Abilify  [aripiprazole ], Bupropion, Doxycycline , and Vyvanse  [lisdexamfetamine dimesylate ]    Review of Systems   Review of Systems  Constitutional:  Negative for fever.  Gastrointestinal:  Positive for abdominal pain, diarrhea, nausea and vomiting. Negative for blood in stool.    Physical Exam Updated Vital Signs BP 119/72   Pulse 80   Temp (!) 97.3 F (36.3 C)   Resp 18   Ht 1.753 m (5' 9)   Wt 92.1 kg   SpO2 100%    BMI 29.98 kg/m  Physical Exam CONSTITUTIONAL: Well developed/well nourished, uncomfortable appearing HEAD: Normocephalic/atraumatic ENMT: Mucous membranes moist, no angioedema NECK: supple no meningeal signs CV: S1/S2 noted, no murmurs/rubs/gallops noted LUNGS: Lungs are clear to auscultation bilaterally, no apparent distress ABDOMEN: soft, moderate diffuse tenderness, no rebound or guarding, bowel sounds noted throughout abdomen NEURO: Pt is awake/alert/appropriate, moves all extremitiesx4.  No facial droop.   EXTREMITIES full ROM SKIN: warm, color normal PSYCH: Anxious ED Results / Procedures / Treatments   Labs (all labs ordered are listed, but only abnormal results are displayed) Labs Reviewed  COMPREHENSIVE METABOLIC PANEL - Abnormal; Notable for the following components:  Result Value   CO2 20 (*)    Glucose, Bld 134 (*)    All other components within normal limits  CBC - Abnormal; Notable for the following components:   WBC 19.4 (*)    RBC 5.33 (*)    Hemoglobin 16.0 (*)    HCT 46.1 (*)    All other components within normal limits  LIPASE, BLOOD    EKG None  Radiology MR BREAST BILATERAL W WO CONTRAST INC CAD Result Date: 06/09/2023 CLINICAL DATA:  38 year old female presenting for 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 She has history of a benign biopsy in the right breast in November of 2019 demonstrating fibrocystic and fibroadenomatoid change. EXAM: BILATERAL BREAST MRI WITH AND WITHOUT CONTRAST TECHNIQUE: Multiplanar, multisequence MR images of both breasts were obtained prior to and following the intravenous administration of 9 ml of Vueway  Three-dimensional MR images were rendered by post-processing of the original MR data on an independent workstation. The three-dimensional MR images were interpreted, and findings are reported in the following complete MRI report for this study. Three dimensional images were evaluated at the independent  interpreting workstation using the DynaCAD thin client. COMPARISON:  Previous exam(s). FINDINGS: Breast composition: c. Heterogeneous fibroglandular tissue. Background parenchymal enhancement: Marked Right breast: No mass or abnormal enhancement. Left breast: No mass or abnormal enhancement. Lymph nodes: No abnormal appearing lymph nodes. Ancillary findings:  None. IMPRESSION: No MRI evidence of malignancy in the bilateral breasts. RECOMMENDATION: 1.  Annual screening mammography is due in March of 2025. 2.  Annual high risk screening MRI in 1 year. BI-RADS CATEGORY  1: Negative. Electronically Signed   By: Rosaline Collet M.D.   On: 06/09/2023 14:28    Procedures Procedures    Medications Ordered in ED Medications  ondansetron  (ZOFRAN ) injection 4 mg (4 mg Intravenous Given 06/09/23 2326)  fentaNYL  (SUBLIMAZE ) injection 100 mcg (100 mcg Intravenous Given 06/09/23 2326)  sodium chloride  0.9 % bolus 1,000 mL (1,000 mLs Intravenous New Bag/Given 06/09/23 2328)  ketorolac  (TORADOL ) 15 MG/ML injection 15 mg (15 mg Intravenous Given 06/10/23 0007)    ED Course/ Medical Decision Making/ A&P Clinical Course as of 06/10/23 0110  Fri Jun 09, 2023  2339 WBC(!): 19.4 Leukocytosis [DW]  Sat Jun 10, 2023  0009 Patient reports feeling improved [DW]  0009 Patient requesting Zofran  and Bentyl  at discharge as she has had good response previously with these [DW]  0106 Patient feels dramatically improved.  No diarrhea.  She reports her pain is resolving.  She did have a small bout of vomiting after drinking ginger ale but that is improved and still taking fluids  Patient declines further workup.  She does not want to have CT imaging.  At this point patient does likely have a self-limited gastroenteritis. At her request she was given Bentyl  and Zofran .  Patient is safe for discharge home [DW]    Clinical Course User Index [DW] Midge Golas, MD                                 Medical Decision  Making Amount and/or Complexity of Data Reviewed Labs: ordered. Decision-making details documented in ED Course.  Risk Prescription drug management.   This patient presents to the ED for concern of abdominal pain with vomiting and diarrhea, this involves an extensive number of treatment options, and is a complaint that carries with it a high risk of complications  and morbidity.  The differential diagnosis includes but is not limited to gastroenteritis, colitis,  pancreatitis, gastritis, peptic ulcer disease, appendicitis, bowel obstruction, bowel perforation, diverticulitis, ectopic pregnancy, TOA, ovarian torsion   Comorbidities that complicate the patient evaluation: Patient's presentation is complicated by their history of ADHD  Social Determinants of Health: Patient's  tobacco use   increases the complexity of managing their presentation  Additional history obtained: Records reviewed Primary Care Documents  Lab Tests: I Ordered, and personally interpreted labs.  The pertinent results include: Leukocytosis, dehydration   Medicines ordered and prescription drug management: I ordered medication including fentanyl  for pain Reevaluation of the patient after these medicines showed that the patient    improved  Reevaluation: After the interventions noted above, I reevaluated the patient and found that they have :improved  Complexity of problems addressed: Patient's presentation is most consistent with  acute presentation with potential threat to life or bodily function  Disposition: After consideration of the diagnostic results and the patient's response to treatment,  I feel that the patent would benefit from discharge   .           Final Clinical Impression(s) / ED Diagnoses Final diagnoses:  Nausea vomiting and diarrhea    Rx / DC Orders ED Discharge Orders          Ordered    ondansetron  (ZOFRAN -ODT) 8 MG disintegrating tablet  Every 8 hours PRN         06/10/23 0001    dicyclomine  (BENTYL ) 20 MG tablet  2 times daily        06/10/23 0001              Midge Golas, MD 06/10/23 0111

## 2023-06-09 NOTE — ED Triage Notes (Signed)
 Pt presents via N/V/D and abd pain since 1800 today.   Actively vomiting in triage.

## 2023-06-10 ENCOUNTER — Telehealth (HOSPITAL_COMMUNITY): Payer: Self-pay | Admitting: Physician Assistant

## 2023-06-10 MED ORDER — PROMETHAZINE HCL 25 MG RE SUPP
25.0000 mg | Freq: Four times a day (QID) | RECTAL | 0 refills | Status: DC | PRN
Start: 2023-06-10 — End: 2024-01-08

## 2023-06-10 MED ORDER — ONDANSETRON 8 MG PO TBDP: 8.0000 mg | ORAL_TABLET | Freq: Three times a day (TID) | ORAL | 0 refills | Status: DC | PRN

## 2023-06-10 MED ORDER — DICYCLOMINE HCL 20 MG PO TABS: 20.0000 mg | ORAL_TABLET | Freq: Two times a day (BID) | ORAL | 0 refills | Status: DC

## 2023-06-10 NOTE — Telephone Encounter (Cosign Needed)
 Rx for phenergan supp

## 2023-06-10 NOTE — Discharge Instructions (Signed)

## 2023-06-12 ENCOUNTER — Ambulatory Visit: Payer: Commercial Managed Care - PPO | Admitting: Family Medicine

## 2023-06-14 ENCOUNTER — Telehealth: Payer: Self-pay

## 2023-06-14 NOTE — Telephone Encounter (Signed)
 Noted. Glad she's feeling better.

## 2023-06-14 NOTE — Transitions of Care (Post Inpatient/ED Visit) (Signed)
 seen [place] 06/10/2023 N&V&D; last vomited 06/11/23 101.4 fever on 06/10/23; diarrhea stopped on 06/10/23. dx noro virus; today feeling OK. Pt is back at work today; pt has transitioned from bland diet to more normal diet  except avoiding greasy foods. Pt will cb for appt if symptoms return or if needed, sending note to Dr Mariam Shingles.       06/14/2023  Name: Heather Stewart MRN: [id number] DOB: 12-16-1985  Today's TOC FU Call Status: Today's TOC FU Call Status:: Successful TOC FU Call Completed TOC FU Call Complete Date: 06/14/23 Patient's Name and Date of Birth confirmed.  Transition Care Management Follow-up Telephone Call Date of Discharge: 06/10/23 Discharge Facility: [facility]) Type of Discharge: Emergency Department Reason for ED Visit: Other: (seen  06/10/2023 N&V&D; last vomited 06/11/23  101.4 fever on 06/10/23; diarrhea stopped on 06/10/23. dx noro virus; today feeling OK.) How have you been since you were released from the hospital?: Better Any questions or concerns?: No  Items Reviewed: Did you receive and understand the discharge instructions provided?: Yes Medications obtained,verified, and reconciled?: Partial Review Completed Reason for Partial Mediation Review: reviewed meds given in ED; pt last took ondansertron 06/12/23 and Dicyvclomine 06/11/23. Any new allergies since your discharge?: No Dietary orders reviewed?: Yes Type of Diet Ordered:: transition from bland back to normal diet; still avoiding greasy foods. Do you have support at home?: No People in Home: alone Name of Support/Comfort Primary Source: pt can call friends if needed.  Medications Reviewed Today: Medications Reviewed Today   Medications were not reviewed in this encounter     Home Care and Equipment/Supplies: Were Home Health Services Ordered?: NA Any new equipment or medical supplies ordered?: NA  Functional Questionnaire: Do you need assistance with bathing/showering or  dressing?: No Do you need assistance with meal preparation?: No Do you need assistance with eating?: No Do you have difficulty maintaining continence: No Do you need assistance with getting out of bed/getting out of a chair/moving?: No Do you have difficulty managing or taking your medications?: No  Follow up appointments reviewed: PCP Follow-up appointment confirmed?: NA Specialist Hospital Follow-up appointment confirmed?: NA Do you need transportation to your follow-up appointment?: No Do you understand care options if your condition(s) worsen?: Yes-patient verbalized understanding    SIGNATURE Claretha Crocker, LPN

## 2023-06-15 NOTE — Progress Notes (Signed)
I, Stevenson Clinch, CMA acting as a scribe for Clementeen Graham, MD.  Heather Stewart is a 38 y.o. female who presents to [facility] at [facility] today for f/u LBP. Pt was last seen by Dr. Denyse Amass on 05/08/23 and was prescribed cyclobenzaprine and advised to use Tylenol/IBU. She was also referred to [facility]. She was later prescribed hydrocodone.  Today, pt reports continued mid and lower back pain. Has not been to PT since Dec d/t recent illness. Sx are constant. Hydrocodone caused GI upset. Taking IBU, Tylenol, heat, soaking. Has been compliant with HEP provided by PT.   She does have a relationship with Dr. Lorrine Kin at [facility] neurosurgery and has an appointment scheduled with him on February 14 to consider back injections.  Dx imaging: 05/08/23 L-spine XR 11/12/21 L-spine MRI    Pertinent review of systems: No fevers or chills  Relevant historical information: Possible Marfan's   Exam:  BP 118/72   Pulse (!) 102   Ht 5\' 9"  (1.753 m)   Wt 205 lb (93 kg)   SpO2 98%   BMI 30.27 kg/m  General: Well Developed, well nourished, and in no acute distress.   MSK: L-spine nontender palpation midline tender palpation paraspinal musculature. Decreased lumbar motion.    Lab and Radiology Results  X-ray images T-spine obtained today personally and independently interpreted No acute fractures.  Mild degenerative changes. Await formal radiology review    Assessment and Plan: 38 y.o. female with chronic low back pain and pain at the thoracolumbar area.  This is chronic ongoing despite trial of physical therapy at [facility].  Unfortunately [facility] is not able to charge her insurance so she is paying out-of-pocket for each visit which is getting to be pretty expensive.  Plan for MRI lumbar spine to further evaluate source of pain and for injection planning.  She is already scheduled to see Dr. Lorrine Kin on February 14.  Previous lumbar spine MRI is from June  2023.  New MRI should be more helpful.  I also referred her to a different physical therapy location this part of the [facility] network that may be cheaper for her. PDMP not reviewed this encounter. Orders Placed This Encounter  Procedures   MR Lumbar Spine Wo Contrast    Standing Status:   Future    Expiration Date:   06/15/2024    What is the patient's sedation requirement?:   No Sedation    Does the patient have a pacemaker or implanted devices?:   No    Preferred imaging location?:   [facility] (table limit-550lbs)   DG Thoracic Spine 2 View    Standing Status:   Future    Number of Occurrences:   1    Expiration Date:   06/15/2024    Reason for Exam (SYMPTOM  OR DIAGNOSIS REQUIRED):   eval pain tspine    Is patient pregnant?:   No    Preferred imaging location?:   [facility]   Ambulatory referral to Physical Therapy    Referral Priority:   Routine    Referral Type:   Physical Medicine    Referral Reason:   Specialty Services Required    Requested Specialty:   Physical Therapy    Number of Visits Requested:   1   No orders of the defined types were placed in this encounter.    Discussed warning signs or symptoms. Please see discharge instructions. Patient expresses understanding.   The  above documentation has been reviewed and is accurate and complete Clementeen Graham, M.D.

## 2023-06-16 ENCOUNTER — Ambulatory Visit (INDEPENDENT_AMBULATORY_CARE_PROVIDER_SITE_OTHER): Payer: Commercial Managed Care - PPO

## 2023-06-16 ENCOUNTER — Encounter: Payer: Self-pay | Admitting: Family Medicine

## 2023-06-16 ENCOUNTER — Ambulatory Visit: Payer: Commercial Managed Care - PPO | Admitting: Family Medicine

## 2023-06-16 VITALS — BP 118/72 | HR 102 | Ht 69.0 in | Wt 205.0 lb

## 2023-06-16 DIAGNOSIS — M545 Low back pain, unspecified: Secondary | ICD-10-CM

## 2023-06-16 DIAGNOSIS — M47814 Spondylosis without myelopathy or radiculopathy, thoracic region: Secondary | ICD-10-CM | POA: Diagnosis not present

## 2023-06-16 DIAGNOSIS — M549 Dorsalgia, unspecified: Secondary | ICD-10-CM | POA: Diagnosis not present

## 2023-06-16 DIAGNOSIS — M546 Pain in thoracic spine: Secondary | ICD-10-CM

## 2023-06-16 DIAGNOSIS — G8929 Other chronic pain: Secondary | ICD-10-CM

## 2023-06-16 DIAGNOSIS — M419 Scoliosis, unspecified: Secondary | ICD-10-CM | POA: Diagnosis not present

## 2023-06-16 NOTE — Progress Notes (Signed)
Thoracic spine x-ray shows some mild arthritis.

## 2023-06-16 NOTE — Patient Instructions (Addendum)
Thank you for coming in today.   Please get an Xray today before you leave   I've referred you to Physical Therapy.  Let us know if you don't hear from them in one week.   You should hear from MRI scheduling within 1 week. If you do not hear please let me know.     

## 2023-06-28 ENCOUNTER — Other Ambulatory Visit: Payer: Self-pay

## 2023-06-28 ENCOUNTER — Encounter: Payer: Self-pay | Admitting: Physical Therapy

## 2023-06-28 ENCOUNTER — Ambulatory Visit: Payer: Commercial Managed Care - PPO | Admitting: Physical Therapy

## 2023-06-28 DIAGNOSIS — R252 Cramp and spasm: Secondary | ICD-10-CM | POA: Diagnosis not present

## 2023-06-28 DIAGNOSIS — M5442 Lumbago with sciatica, left side: Secondary | ICD-10-CM

## 2023-06-28 DIAGNOSIS — R29898 Other symptoms and signs involving the musculoskeletal system: Secondary | ICD-10-CM

## 2023-06-28 NOTE — Therapy (Signed)
OUTPATIENT PHYSICAL THERAPY THORACOLUMBAR EVALUATION   Patient Name: HAIVEN NARDONE MRN: [id number] DOB:1985/09/05, 38 y.o., female Today's Date: 06/28/2023  END OF SESSION:  PT End of Session - 06/28/23 1254     Visit Number 1    Number of Visits 17    Date for PT Re-Evaluation 08/23/23    Authorization Type [facility] [employer]    Authorization Time Period 06/28/23 to 08/23/23    PT Start Time 1200    PT Stop Time 1233   MHP not included in billing   PT Time Calculation (min) 33 min    Activity Tolerance Patient tolerated treatment well    Behavior During Therapy WFL for tasks assessed/performed             Past Medical History:  Diagnosis Date   Acne    ADHD (attention deficit hyperactivity disorder), combined type    dx as child and then received through psychiatry - reviewed records from psych: no recent w/u.  was on vyvanse during nursing school   Adjustment disorder    Anxiety    Chronic back pain    thoracic and lumbar - s/p unrevealing NSG eval    Connective tissue disease (HCC)    ocular lens dislocation, flat feet, high palate, no cardiac involvement   Esophageal reflux    Eye problems 08/2011   subluxated lenses OU, rec test for homocyetinuria to r/o as cause (see scanned form)   Focal nodular hyperplasia of liver 11/10/2018   By MRI at [facility] 07/2018 - see scanned report   Hx of migraines    Irritable bowel syndrome    MDD (major depressive disorder), recurrent episode (HCC)    Pneumonia    Screening examination for pulmonary tuberculosis    Swelling, mass, or lump in head and neck    Uvulitis 10/02/2018   Past Surgical History:  Procedure Laterality Date   BREAST BIOPSY Right 2019   BREAST CYST ASPIRATION Right 2019   CHOLECYSTECTOMY N/A 03/16/2021   Procedure: LAPAROSCOPIC CHOLECYSTECTOMY WITH ICG DYE;  Surgeon: Emelia Loron, MD;  Location: [facility];  Service: General;  Laterality: N/A;   ESOPHAGOGASTRODUODENOSCOPY  07/2018    gastric polyps biopsied, mild acute ?alcoholic gastritis (Dr Leretha Dykes in [place])   FOOT SURGERY     Right-glass removed in OR   FRENULECTOMY, LINGUAL  2001   GANGLION CYST EXCISION     Left wrist   MYRINGOTOMY  multiple   had tissue from behind ears implanted into bilateral TMs to close (1990s)   REMOVE AND REPLACE LENS Left 2018   2 tension rings placed, lens replaced Lisette Grinder) [facility]   Patient Active Problem List   Diagnosis Date Noted   Hyperlipidemia due to dietary fat intake 02/13/2023   Marfan syndrome 12/09/2021   Obesity, Class I, BMI 30-34.9 09/28/2021   Low grade squamous intraepithelial lesion (LGSIL) of vulva 09/19/2021   Syncope and collapse 07/13/2020   Marfanoid habitus 07/13/2020   Breast cancer screening, high risk patient 01/29/2019   Focal nodular hyperplasia of liver 11/10/2018   Adult acne 11/01/2018   Allergic rhinitis 11/01/2018   Monoallelic mutation of PALB2 gene 05/14/2018   Acute cough 04/10/2018   Generalized hypermobility of joints 05/06/2016   Myopia with astigmatism 09/13/2011   Subluxation of both lenses 09/13/2011   Connective tissue disorder (HCC) 03/08/2011   Encounter for general adult medical examination with abnormal findings 03/08/2011   GERD 07/08/2010   MDD (major depressive disorder), recurrent  episode, moderate (HCC) 05/12/2010   IRRITABLE BOWEL SYNDROME 05/08/2009   SWELLING MASS OR LUMP IN HEAD AND NECK 09/05/2008   MIGRAINES, HX OF 10/03/2007   Attention deficit hyperactivity disorder (ADHD) 10/03/2007    PCP: Eustaquio Boyden MD   REFERRING PROVIDER: Rodolph Bong, MD  REFERRING DIAG:  Diagnosis  M54.50 (ICD-10-CM) - Acute left-sided low back pain without sciatica    Rationale for Evaluation and Treatment: Rehabilitation  THERAPY DIAG:  Acute bilateral low back pain with left-sided sciatica  Cramp and spasm  Other symptoms and signs involving the musculoskeletal system  ONSET DATE:  05/05/23  SUBJECTIVE:                                                                                                                                                                                           SUBJECTIVE STATEMENT:  Pulled on my ugg boot on 05/05/23, something happened, not sure what but I was flat on my back for like 3 days. Dr. Denyse Amass sent me to [facility] and for the first few visits all they could do was dry needling, eventually they did some lumbar ROM and laser and piso. Things got a bit better and then Christmas came and I've not been able to get to [facility], was having trouble with copays + cost of dry needling. Had wanted to come here at first but wait to be seen was about 3 weeks. Have been doing lumbar rotations and piriformis stretching at home from [facility]. Pain does go down into L buttcheek. In 2023 had similar issues where my back spasmed and ended up getting an injection which resolved the issue. Work in the ED and my back kills me from my bra line down. Schedule is  hectic, its difficult to fit in appointment. Physical activities are very painful  PERTINENT HISTORY:  See above   PAIN:  Are you having pain? Yes: NPRS scale: 3-4/10 now, at worst 7/10 Pain location: bra line down into hips  Pain description: tight, spasm, bending  Aggravating factors: bending at work, work duties, walking, shopping/activities that are for an extended period of time, TENS   Relieving factors: sometimes popping back  PRECAUTIONS: Other: connective disorder- caution with joint dislocations- shoulders sublux with planks for example    RED FLAGS: None   WEIGHT BEARING RESTRICTIONS: No  FALLS:  Has patient fallen in last 6 months? No  LIVING ENVIRONMENT: Lives with: lives alone Lives in: House/apartment   OCCUPATION: ED nurse   PLOF: Independent, Independent with basic ADLs, Independent with gait, and Independent with transfers  PATIENT GOALS: pain relief, be functional, be able  to  go to the gym without out of control pain  NEXT MD VISIT: Dr. Denyse Amass PRN  OBJECTIVE:  Note: Objective measures were completed at Evaluation unless otherwise noted.  DIAGNOSTIC FINDINGS:  CLINICAL DATA:  Low back pain.   EXAM: LUMBAR SPINE - 2-3 VIEW   COMPARISON:  None Available.   FINDINGS: No fracture, bone lesion or spondylolisthesis.   Mild curvature, convex the left, apex at L2-L3. There is a mild rotary component.   Mild loss of disc height with anterior endplate spurring at L1-L2. Remaining disc spaces are well preserved.   Previous cholecystectomy.  Soft tissues otherwise unremarkable.   IMPRESSION: 1. No fracture or acute finding.  No spondylolisthesis. 2. Mild levoscoliosis with a rotary component. Mild disc degenerative changes at L1-L2.  PATIENT SURVEYS:  FOTO 45, predicted 64 in 11 visits   COGNITION: Overall cognitive status: Within functional limits for tasks assessed     SENSATION: Not tested  MUSCLE LENGTH:   Hamstrings and piriformis WNL   POSTURE: rounded shoulders, forward head, decreased lumbar lordosis, and increased thoracic kyphosis  PALPATION: Lumbar paraspinals in intense spasm especially on the L   LUMBAR ROM:   AROM eval  Flexion Full ROM, pain coming up   Extension Moderate limitation   Right lateral flexion Mild limitation   Left lateral flexion Mild limitation   Right rotation   Left rotation    (Blank rows = not tested)    LOWER EXTREMITY MMT:    MMT Right eval Left eval  Hip flexion 5 5  Hip extension    Hip abduction 5 5  Hip adduction    Hip internal rotation    Hip external rotation    Knee flexion 5 5  Knee extension 5 5  Ankle dorsiflexion    Ankle plantarflexion    Ankle inversion    Ankle eversion     (Blank rows = not tested)    TREATMENT DATE:   06/26/23  Eval, POC, HEP  MHP x8 minutes hooklying (not included in billing)  Percussion gun L lumbar paraspinals with pain improved to  1/10 afterwards                                                                                                                                   PATIENT EDUCATION:  Education details: exam findings, POC, HEP, plan to incorporate dry needling since this has been beneficial  Person educated: Patient Education method: Explanation, Demonstration, and Verbal cues Education comprehension: verbalized understanding, returned demonstration, and verbal cues required  HOME EXERCISE PROGRAM:  TBD- has HEP from [facility] for now   ASSESSMENT:  CLINICAL IMPRESSION: Patient is a 38 y.o. F who was seen today for physical therapy evaluation and treatment for  Diagnosis  M54.50 (ICD-10-CM) - Acute left-sided low back pain without sciatica  . Of note she was seen for f/u care at [facility] but ended up ultimately transitioning to  [facility]. Spent a large bit of time in eval on pain control with MHP and trial of percussion gun. Will benefit from skilled PT services for ongoing pain control and to assist in return to optimal level of function.   OBJECTIVE IMPAIRMENTS: decreased mobility, difficulty walking, decreased ROM, decreased strength, increased fascial restrictions, increased muscle spasms, and pain.   ACTIVITY LIMITATIONS: carrying, lifting, standing, sleeping, stairs, transfers, bed mobility, and locomotion level  PARTICIPATION LIMITATIONS: driving, shopping, community activity, and occupation  PERSONAL FACTORS: Fitness, Past/current experiences, and Time since onset of injury/illness/exacerbation are also affecting patient's functional outcome.   REHAB POTENTIAL: Good  CLINICAL DECISION MAKING: Stable/uncomplicated  EVALUATION COMPLEXITY: Low   GOALS: Goals reviewed with patient? No  SHORT TERM GOALS: Target date: 07/26/2023    Will be compliant with appropriate progressive HEP  Baseline: Goal status: INITIAL  2.  Pain to be no more than 4/10 at worst Baseline:  Goal status:  INITIAL  3.  Will demonstrate good functional biomechanics for floor to waist height lifting and bed mobility  Baseline:  Goal status: INITIAL  4.  Lumbar spasms to have improved by at least 50% to assist in pain control  Baseline:  Goal status: INITIAL    LONG TERM GOALS: Target date: 08/23/2023    Pain to be no more than 2/10 at worst with all functional tasks  Baseline:  Goal status: INITIAL  2.  Radicular symptoms to have resolved  Baseline:  Goal status: INITIAL  3.  Will demonstrate good technique for patient transfers/work simulated activities to prevent recurrence of pain  Baseline:  Goal status: INITIAL  4.  Will have been able to perform all gym based exercises/activities as desired without increase in pain  Baseline:  Goal status: INITIAL  5.  FOTO score to be within 5 points of predicted value by time of DC  Baseline:  Goal status: INITIAL    PLAN:  PT FREQUENCY: 1-2x/week  PT DURATION: 8 weeks  PLANNED INTERVENTIONS: 97164- PT Re-evaluation, 97110-Therapeutic exercises, 97530- Therapeutic activity, 97535- Self Care, 13086- Manual therapy, [id number]- Aquatic Therapy, 97014- Electrical stimulation (unattended), [id number]- Traction (mechanical), Taping, Dry Needling, Cryotherapy, and Moist heat.  PLAN FOR NEXT SESSION: dry needling with or without estim, percussion gun + heat was also helpful in pain control at eval. Avoid TENS- has at home and actually worsens pain per pt. Core strengthening as tolerated/appropriate, biomechanics/postural work when ready   Nedra Hai, PT, DPT 06/28/23 12:55 PM

## 2023-07-03 ENCOUNTER — Ambulatory Visit
Admission: RE | Admit: 2023-07-03 | Discharge: 2023-07-03 | Disposition: A | Discharge status: Home / Self Care | Payer: Commercial Managed Care - PPO | Source: Ambulatory Visit | Attending: Family Medicine | Admitting: Family Medicine

## 2023-07-03 ENCOUNTER — Other Ambulatory Visit (HOSPITAL_COMMUNITY): Payer: Self-pay

## 2023-07-03 DIAGNOSIS — D2271 Melanocytic nevi of right lower limb, including hip: Secondary | ICD-10-CM | POA: Diagnosis not present

## 2023-07-03 DIAGNOSIS — D1801 Hemangioma of skin and subcutaneous tissue: Secondary | ICD-10-CM | POA: Diagnosis not present

## 2023-07-03 DIAGNOSIS — L700 Acne vulgaris: Secondary | ICD-10-CM | POA: Diagnosis not present

## 2023-07-03 DIAGNOSIS — D225 Melanocytic nevi of trunk: Secondary | ICD-10-CM | POA: Diagnosis not present

## 2023-07-03 DIAGNOSIS — M5126 Other intervertebral disc displacement, lumbar region: Secondary | ICD-10-CM | POA: Diagnosis not present

## 2023-07-03 DIAGNOSIS — M47816 Spondylosis without myelopathy or radiculopathy, lumbar region: Secondary | ICD-10-CM | POA: Diagnosis not present

## 2023-07-03 DIAGNOSIS — D2262 Melanocytic nevi of left upper limb, including shoulder: Secondary | ICD-10-CM | POA: Diagnosis not present

## 2023-07-03 DIAGNOSIS — G8929 Other chronic pain: Secondary | ICD-10-CM

## 2023-07-03 DIAGNOSIS — L853 Xerosis cutis: Secondary | ICD-10-CM | POA: Diagnosis not present

## 2023-07-03 DIAGNOSIS — L812 Freckles: Secondary | ICD-10-CM | POA: Diagnosis not present

## 2023-07-03 MED ORDER — AMPICILLIN 500 MG PO CAPS
500.0000 mg | ORAL_CAPSULE | Freq: Two times a day (BID) | ORAL | 6 refills | Status: AC
  Filled 2023-07-03: qty 60, 30d supply, fill #0
  Filled 2023-11-10 – 2023-12-03 (×2): qty 60, 30d supply, fill #1
  Filled 2024-01-30: qty 60, 30d supply, fill #2
  Filled 2024-04-12: qty 60, 30d supply, fill #3

## 2023-07-07 ENCOUNTER — Other Ambulatory Visit (HOSPITAL_COMMUNITY): Payer: Self-pay

## 2023-07-07 ENCOUNTER — Encounter: Payer: Self-pay | Admitting: Family Medicine

## 2023-07-07 ENCOUNTER — Other Ambulatory Visit: Payer: Self-pay

## 2023-07-14 DIAGNOSIS — M546 Pain in thoracic spine: Secondary | ICD-10-CM | POA: Diagnosis not present

## 2023-07-21 ENCOUNTER — Encounter: Payer: Commercial Managed Care - PPO | Admitting: Physical Therapy

## 2023-07-25 ENCOUNTER — Encounter: Payer: Commercial Managed Care - PPO | Admitting: Physical Therapy

## 2023-07-31 DIAGNOSIS — M5416 Radiculopathy, lumbar region: Secondary | ICD-10-CM | POA: Diagnosis not present

## 2023-08-01 ENCOUNTER — Encounter: Payer: Self-pay | Admitting: Family Medicine

## 2023-08-01 ENCOUNTER — Telehealth: Payer: Self-pay

## 2023-08-01 NOTE — Telephone Encounter (Signed)
 Request for FMLA extension with intermittent leave for flare ups and appointments.

## 2023-08-02 ENCOUNTER — Encounter: Payer: Self-pay | Admitting: Genetic Counselor

## 2023-08-03 ENCOUNTER — Other Ambulatory Visit (HOSPITAL_COMMUNITY): Payer: Self-pay

## 2023-08-03 MED ORDER — AMPHETAMINE-DEXTROAMPHET ER 25 MG PO CP24
25.0000 mg | ORAL_CAPSULE | Freq: Every morning | ORAL | 0 refills | Status: DC
  Filled 2023-08-04: qty 30, 30d supply, fill #0

## 2023-08-03 NOTE — Telephone Encounter (Signed)
 Forms faxed successfully to [phone], copy to scan.

## 2023-08-03 NOTE — Telephone Encounter (Signed)
 Form completed and placed on Dr. Zollie Pee desk to review and sign on 08/01/23.   Form reviewed and signed by Dr. Denyse Amass and placed at the front desk for faxing/scanning today.

## 2023-08-04 ENCOUNTER — Other Ambulatory Visit: Payer: Self-pay

## 2023-08-04 ENCOUNTER — Other Ambulatory Visit (HOSPITAL_COMMUNITY): Payer: Self-pay

## 2023-08-09 NOTE — Telephone Encounter (Signed)
 Date corrected on form.   Form placed at the front desk for faxing/scanning.

## 2023-08-09 NOTE — Telephone Encounter (Signed)
Need clarification on dates.

## 2023-08-09 NOTE — Telephone Encounter (Signed)
 Faxed. Copy put in Dr Denyse Amass box. Sent to scan.

## 2023-08-10 ENCOUNTER — Other Ambulatory Visit (HOSPITAL_COMMUNITY): Payer: Self-pay

## 2023-08-10 DIAGNOSIS — F411 Generalized anxiety disorder: Secondary | ICD-10-CM | POA: Diagnosis not present

## 2023-08-10 DIAGNOSIS — F900 Attention-deficit hyperactivity disorder, predominantly inattentive type: Secondary | ICD-10-CM | POA: Diagnosis not present

## 2023-08-10 DIAGNOSIS — F32A Depression, unspecified: Secondary | ICD-10-CM | POA: Diagnosis not present

## 2023-08-10 MED ORDER — AMPHETAMINE-DEXTROAMPHET ER 25 MG PO CP24
25.0000 mg | ORAL_CAPSULE | Freq: Every morning | ORAL | 0 refills | Status: DC
  Filled 2023-10-31: qty 30, 30d supply, fill #0

## 2023-08-10 MED ORDER — CLONAZEPAM 0.5 MG PO TABS
0.5000 mg | ORAL_TABLET | Freq: Two times a day (BID) | ORAL | 2 refills | Status: AC
  Filled 2023-12-03: qty 60, 30d supply, fill #0

## 2023-08-10 MED ORDER — DULOXETINE HCL 60 MG PO CPEP
60.0000 mg | ORAL_CAPSULE | Freq: Every day | ORAL | 5 refills | Status: DC
Start: 2023-08-10 — End: 2024-02-15
  Filled 2023-11-10: qty 90, 90d supply, fill #0
  Filled 2024-01-30: qty 90, 90d supply, fill #1

## 2023-08-10 MED ORDER — AMPHETAMINE-DEXTROAMPHET ER 25 MG PO CP24
25.0000 mg | ORAL_CAPSULE | Freq: Every morning | ORAL | 0 refills | Status: DC
Start: 2023-10-27 — End: 2023-12-25
  Filled 2023-12-03: qty 30, 30d supply, fill #0

## 2023-08-10 MED ORDER — LAMOTRIGINE 100 MG PO TABS
100.0000 mg | ORAL_TABLET | Freq: Every day | ORAL | 5 refills | Status: DC
  Filled 2023-09-19: qty 30, 30d supply, fill #0
  Filled 2023-11-10 – 2024-01-04 (×2): qty 30, 30d supply, fill #1

## 2023-08-10 MED ORDER — AMPHETAMINE-DEXTROAMPHET ER 25 MG PO CP24
25.0000 mg | ORAL_CAPSULE | Freq: Every morning | ORAL | 0 refills | Status: DC
  Filled 2023-09-19: qty 30, 30d supply, fill #0

## 2023-08-10 MED ORDER — QUETIAPINE FUMARATE 50 MG PO TABS
50.0000 mg | ORAL_TABLET | Freq: Every day | ORAL | 5 refills | Status: DC
  Filled 2023-11-10: qty 180, 90d supply, fill #0

## 2023-08-11 ENCOUNTER — Other Ambulatory Visit (HOSPITAL_COMMUNITY): Payer: Self-pay

## 2023-08-19 ENCOUNTER — Other Ambulatory Visit: Payer: Self-pay | Admitting: Nurse Practitioner

## 2023-08-19 ENCOUNTER — Telehealth: Admitting: Nurse Practitioner

## 2023-08-19 ENCOUNTER — Other Ambulatory Visit (HOSPITAL_COMMUNITY): Payer: Self-pay

## 2023-08-19 DIAGNOSIS — J069 Acute upper respiratory infection, unspecified: Secondary | ICD-10-CM

## 2023-08-19 MED ORDER — IPRATROPIUM BROMIDE 0.03 % NA SOLN
2.0000 | Freq: Two times a day (BID) | NASAL | 12 refills | Status: DC
  Filled 2023-08-19: qty 30, 43d supply, fill #0

## 2023-08-19 MED ORDER — BENZONATATE 200 MG PO CAPS
200.0000 mg | ORAL_CAPSULE | Freq: Two times a day (BID) | ORAL | 0 refills | Status: DC | PRN
  Filled 2023-08-19: qty 20, 10d supply, fill #0

## 2023-08-19 MED ORDER — PREDNISONE 20 MG PO TABS
20.0000 mg | ORAL_TABLET | Freq: Every day | ORAL | 0 refills | Status: AC
  Filled 2023-08-19: qty 5, 5d supply, fill #0

## 2023-08-19 NOTE — Progress Notes (Signed)
 I have spent 5 minutes in review of e-visit questionnaire, review and updating patient chart, medical decision making and response to patient.   Claiborne Rigg, NP

## 2023-08-19 NOTE — Progress Notes (Signed)

## 2023-08-20 ENCOUNTER — Ambulatory Visit
Admission: EM | Admit: 2023-08-20 | Discharge: 2023-08-20 | Disposition: A | Discharge status: Home / Self Care | Attending: Internal Medicine | Admitting: Internal Medicine

## 2023-08-20 ENCOUNTER — Other Ambulatory Visit: Payer: Self-pay

## 2023-08-20 ENCOUNTER — Encounter: Payer: Self-pay | Admitting: *Deleted

## 2023-08-20 ENCOUNTER — Ambulatory Visit

## 2023-08-20 ENCOUNTER — Ambulatory Visit (INDEPENDENT_AMBULATORY_CARE_PROVIDER_SITE_OTHER)

## 2023-08-20 DIAGNOSIS — B349 Viral infection, unspecified: Secondary | ICD-10-CM

## 2023-08-20 DIAGNOSIS — R051 Acute cough: Secondary | ICD-10-CM | POA: Diagnosis not present

## 2023-08-20 DIAGNOSIS — R0602 Shortness of breath: Secondary | ICD-10-CM

## 2023-08-20 DIAGNOSIS — R059 Cough, unspecified: Secondary | ICD-10-CM | POA: Diagnosis not present

## 2023-08-20 MED ORDER — METHYLPREDNISOLONE ACETATE 80 MG/ML IJ SUSP
40.0000 mg | Freq: Once | INTRAMUSCULAR | Status: AC
  Administered 2023-08-20: 40 mg via INTRAMUSCULAR

## 2023-08-20 NOTE — ED Provider Notes (Signed)
 [facility]    CSN: [id number] Arrival date & time: 08/20/23  0954      History   Chief Complaint Chief Complaint  Patient presents with   Cough    HPI Heather Stewart is a 38 y.o. female.   Patient presents with productive cough, nasal congestion, low-grade temp that started about 4 days ago.  Reports that she works as a Engineer, civil (consulting) and has had several known sick contacts.  She has taken over-the-counter Mucinex and Tylenol.  She reports that she had previously prescribed a Medrol Dosepak at home.  She took 1 dose yesterday.  She has also taken Zithromax that she got from [place].  Reports it was 500 mg.  She did have an e-visit yesterday where she was prescribed ipratropium, benzonatate, and a course of prednisone.  She reports that she has not yet picked these up because she does not want to take them. She states that she does not like to take steroids given side effects.  She has concern for pneumonia given she has a productive cough and shortness of breath.  Denies any history of asthma or COPD.  She denies that she smokes cigarettes but does report that she vapes.   Cough   Past Medical History:  Diagnosis Date   Acne    ADHD (attention deficit hyperactivity disorder), combined type    dx as child and then received through psychiatry - reviewed records from psych: no recent w/u.  was on vyvanse during nursing school   Adjustment disorder    Anxiety    Chronic back pain    thoracic and lumbar - s/p unrevealing NSG eval    Connective tissue disease (HCC)    ocular lens dislocation, flat feet, high palate, no cardiac involvement   Esophageal reflux    Eye problems 08/2011   subluxated lenses OU, rec test for homocyetinuria to r/o as cause (see scanned form)   Focal nodular hyperplasia of liver 11/10/2018   By MRI at [facility] 07/2018 - see scanned report   Hx of migraines    Irritable bowel syndrome    MDD (major depressive disorder), recurrent  episode (HCC)    Pneumonia    Screening examination for pulmonary tuberculosis    Swelling, mass, or lump in head and neck    Uvulitis 10/02/2018    Patient Active Problem List   Diagnosis Date Noted   Hyperlipidemia due to dietary fat intake 02/13/2023   Marfan syndrome 12/09/2021   Obesity, Class I, BMI 30-34.9 09/28/2021   Low grade squamous intraepithelial lesion (LGSIL) of vulva 09/19/2021   Syncope and collapse 07/13/2020   Marfanoid habitus 07/13/2020   Breast cancer screening, high risk patient 01/29/2019   Focal nodular hyperplasia of liver 11/10/2018   Adult acne 11/01/2018   Allergic rhinitis 11/01/2018   Monoallelic mutation of PALB2 gene 05/14/2018   Acute cough 04/10/2018   Generalized hypermobility of joints 05/06/2016   Myopia with astigmatism 09/13/2011   Subluxation of both lenses 09/13/2011   Connective tissue disorder (HCC) 03/08/2011   Encounter for general adult medical examination with abnormal findings 03/08/2011   GERD 07/08/2010   MDD (major depressive disorder), recurrent episode, moderate (HCC) 05/12/2010   IRRITABLE BOWEL SYNDROME 05/08/2009   SWELLING MASS OR LUMP IN HEAD AND NECK 09/05/2008   MIGRAINES, HX OF 10/03/2007   Attention deficit hyperactivity disorder (ADHD) 10/03/2007    Past Surgical History:  Procedure Laterality Date   BREAST BIOPSY Right 2019  BREAST CYST ASPIRATION Right 2019   CHOLECYSTECTOMY N/A 03/16/2021   Procedure: LAPAROSCOPIC CHOLECYSTECTOMY WITH ICG DYE;  Surgeon: Emelia Loron, MD;  Location: [facility];  Service: General;  Laterality: N/A;   ESOPHAGOGASTRODUODENOSCOPY  07/2018   gastric polyps biopsied, mild acute ?alcoholic gastritis (Dr Leretha Dykes in [place])   FOOT SURGERY     Right-glass removed in OR   FRENULECTOMY, LINGUAL  2001   GANGLION CYST EXCISION     Left wrist   MYRINGOTOMY  multiple   had tissue from behind ears implanted into bilateral TMs to close (1990s)   REMOVE AND REPLACE LENS Left  2018   2 tension rings placed, lens replaced Lisette Grinder) [facility]    OB History     Gravida  0   Para  0   Term  0   Preterm  0   AB  0   Living  0      SAB  0   IAB  0   Ectopic  0   Multiple  0   Live Births  0            Home Medications    Prior to Admission medications   Medication Sig Start Date End Date Taking? Authorizing Provider  albuterol (VENTOLIN HFA) 108 (90 Base) MCG/ACT inhaler Inhale 1 puff into the lungs as directed. 08/27/22  Yes Harris, Abigail, PA-C  amphetamine-dextroamphetamine (ADDERALL XR) 25 MG 24 hr capsule Take 1 capsule by mouth in the morning. 09/29/23  Yes   ampicillin (PRINCIPEN) 500 MG capsule Take 1 capsule (500 mg total) by mouth 2 (two) times daily. 07/03/23  Yes   clonazePAM (KLONOPIN) 0.5 MG tablet Take 1 tablet (0.5 mg total) by mouth 2 (two) times daily. 08/10/23  Yes   DULoxetine (CYMBALTA) 60 MG capsule Take 1 capsule (60 mg total) by mouth daily. 08/10/23  Yes   eszopiclone (LUNESTA) 2 MG TABS tablet Take 1 tablet (2 mg total) by mouth at bedtime. 03/21/23  Yes   lamoTRIgine (LAMICTAL) 100 MG tablet Take 1 tablet (100 mg total) by mouth daily. 08/10/23  Yes   pantoprazole (PROTONIX) 40 MG tablet Take 1 tablet (40 mg total) by mouth daily. 05/15/23  Yes   QUEtiapine (SEROQUEL) 50 MG tablet Take 1-2 tablets (50-100 mg total) by mouth at bedtime. 08/10/23  Yes   amphetamine-dextroamphetamine (ADDERALL XR) 20 MG 24 hr capsule Take 1 capsule (20 mg total) by mouth in the morning. 02/15/23     amphetamine-dextroamphetamine (ADDERALL XR) 25 MG 24 hr capsule Take 1 capsule by mouth every morning. 02/07/23     amphetamine-dextroamphetamine (ADDERALL XR) 25 MG 24 hr capsule Take 1 capsule by mouth in the morning. 03/14/23     amphetamine-dextroamphetamine (ADDERALL XR) 25 MG 24 hr capsule Take 1 capsule by mouth in the morning. 05/10/23     amphetamine-dextroamphetamine (ADDERALL XR) 25 MG 24 hr capsule Take 1 capsule by mouth in the  morning. 04/12/23     amphetamine-dextroamphetamine (ADDERALL XR) 25 MG 24 hr capsule Take 1 capsule by mouth every morning. 08/03/23     amphetamine-dextroamphetamine (ADDERALL XR) 25 MG 24 hr capsule Take 1 capsule by mouth in the morning. 09/01/23     amphetamine-dextroamphetamine (ADDERALL XR) 25 MG 24 hr capsule Take 1 capsule by mouth in the morning. 10/27/23     ampicillin (PRINCIPEN) 500 MG capsule Take 1 capsule (500 mg total) by mouth 2 (two) times daily. 03/02/23     benzonatate (TESSALON) 200  MG capsule Take 1 capsule (200 mg total) by mouth 2 (two) times daily as needed for cough. 08/19/23   Claiborne Rigg, NP  clobetasol ointment (TEMOVATE) 0.05 % Apply a thin layer to the affected area(s) topically 2 (two) times daily. Patient not taking: Reported on 08/20/2023 03/01/23     clonazePAM (KLONOPIN) 0.5 MG tablet Take 1 tablet (0.5 mg total) by mouth daily as needed. 06/29/22     clonazePAM (KLONOPIN) 0.5 MG tablet Take 1 tablet (0.5 mg total) by mouth 2 (two) times daily. 03/21/23     cyclobenzaprine (FLEXERIL) 10 MG tablet Take 1 tablet (10 mg total) by mouth 3 (three) times daily as needed for muscle spasms. Patient not taking: Reported on 08/20/2023 05/08/23   Rodolph Bong, MD  dicyclomine (BENTYL) 20 MG tablet Take 1 tablet (20 mg total) by mouth 2 (two) times daily. 06/10/23   Zadie Rhine, MD  DULoxetine (CYMBALTA) 60 MG capsule Take 1 capsule (60 mg total) by mouth daily. 03/07/22     eszopiclone (LUNESTA) 2 MG TABS tablet Take 1 tablet (2 mg total) by mouth at bedtime. 11/10/22     eszopiclone (LUNESTA) 2 MG TABS tablet Take 1 tablet (2 mg total) by mouth at bedtime. 12/19/22     fluticasone (FLONASE) 50 MCG/ACT nasal spray Place 1 spray into both nostrils at bedtime. 07/07/22   Coralyn Helling, MD  HYDROcodone-acetaminophen (NORCO/VICODIN) 5-325 MG tablet Take 1 tablet by mouth every 6 (six) hours as needed. 05/17/23   Rodolph Bong, MD  ipratropium (ATROVENT) 0.03 % nasal spray Place 2  sprays into both nostrils every 12 (twelve) hours. 08/19/23   Claiborne Rigg, NP  lamoTRIgine (LAMICTAL) 100 MG tablet Take 1 tablet (100 mg total) by mouth daily. 09/27/22     lamoTRIgine (LAMICTAL) 100 MG tablet Take 1 tablet (100 mg total) by mouth daily. 11/21/22     lamoTRIgine (LAMICTAL) 100 MG tablet Take 1 tablet (100 mg total) by mouth daily. 03/21/23     ondansetron (ZOFRAN-ODT) 8 MG disintegrating tablet Take 1 tablet (8 mg total) by mouth every 8 (eight) hours as needed. 06/10/23   Zadie Rhine, MD  predniSONE (DELTASONE) 20 MG tablet Take 1 tablet (20 mg total) by mouth daily with breakfast for 5 days. 08/19/23 08/24/23  Claiborne Rigg, NP  promethazine (PHENERGAN) 25 MG suppository Place 1 suppository (25 mg total) rectally every 6 (six) hours as needed for nausea or vomiting. 06/10/23   Elson Areas, PA-C  triamcinolone cream (KENALOG) 0.1 % Apply a small amount to affected area topically 2 (two) times daily as needed. 11/10/22     ARIPiprazole (ABILIFY) 5 MG tablet Take 1 tablet (5 mg total) by mouth daily. Patient not taking: No sig reported 10/30/20 04/02/21      Family History Family History  Problem Relation Age of Onset   Migraines Mother    Alcohol abuse Father    Other Father        Spinal problem   Cancer Brother        HALF brother (had kidney removed at 11 months-then died of cancer)   Liver cancer Brother    Breast cancer Maternal Grandmother    COPD Paternal Grandfather    Breast cancer Maternal Aunt        x2   Coronary artery disease Other        GF 5v bypass   Breast cancer Other        Aunts x3  Heart disease Other        MGF, PGF, GGM   Kidney disease Other        GGM    Social History Social History   Tobacco Use   Smoking status: Every Day    Types: Cigarettes, E-cigarettes   Smokeless tobacco: Never  Vaping Use   Vaping status: Every Day  Substance Use Topics   Alcohol use: Yes    Comment: occ wine   Drug use: No     Allergies    Amoxicillin-pot clavulanate, Neosporin [neomycin-bacitracin zn-polymyx], Abilify [aripiprazole], Bupropion, Doxycycline, and Vyvanse [lisdexamfetamine dimesylate]   Review of Systems Review of Systems Per HPI  Physical Exam Triage Vital Signs ED Triage Vitals  Encounter Vitals Group     BP 08/20/23 1029 128/79     Systolic BP Percentile --      Diastolic BP Percentile --      Pulse Rate 08/20/23 1029 79     Resp 08/20/23 1029 18     Temp 08/20/23 1029 98.1 F (36.7 C)     Temp Source 08/20/23 1029 Oral     SpO2 08/20/23 1029 95 %     Weight --      Height --      Head Circumference --      Peak Flow --      Pain Score 08/20/23 1031 3     Pain Loc --      Pain Education --      Exclude from Growth Chart --    No data found.  Updated Vital Signs BP 128/79 (BP Location: Left Arm)   Pulse 79   Temp 98.1 F (36.7 C) (Oral)   Resp 18   LMP 08/16/2023   SpO2 95%   Visual Acuity Right Eye Distance:   Left Eye Distance:   Bilateral Distance:    Right Eye Near:   Left Eye Near:    Bilateral Near:     Physical Exam Constitutional:      General: She is not in acute distress.    Appearance: Normal appearance. She is not toxic-appearing or diaphoretic.  HENT:     Head: Normocephalic and atraumatic.     Right Ear: Tympanic membrane and ear canal normal.     Left Ear: Tympanic membrane and ear canal normal.     Nose: Congestion present.     Mouth/Throat:     Mouth: Mucous membranes are moist.     Pharynx: No posterior oropharyngeal erythema.  Eyes:     Extraocular Movements: Extraocular movements intact.     Conjunctiva/sclera: Conjunctivae normal.     Pupils: Pupils are equal, round, and reactive to light.  Cardiovascular:     Rate and Rhythm: Normal rate and regular rhythm.     Pulses: Normal pulses.     Heart sounds: Normal heart sounds.  Pulmonary:     Effort: Pulmonary effort is normal. No respiratory distress.     Breath sounds: Normal breath sounds. No  stridor. No wheezing, rhonchi or rales.  Musculoskeletal:        General: Normal range of motion.     Cervical back: Normal range of motion.  Skin:    General: Skin is warm and dry.  Neurological:     General: No focal deficit present.     Mental Status: She is alert and oriented to person, place, and time. Mental status is at baseline.  Psychiatric:        Mood and  Affect: Mood normal.        Behavior: Behavior normal.      UC Treatments / Results  Labs (all labs ordered are listed, but only abnormal results are displayed) Labs Reviewed - No data to display  EKG   Radiology DG Chest 2 View Result Date: 08/20/2023 CLINICAL DATA:  Cough and shortness of breath. EXAM: CHEST - 2 VIEW COMPARISON:  09/06/2011 and CT chest 04/02/2022. FINDINGS: Trachea is midline. Heart size normal. Lungs are mildly hyperinflated but clear. No pleural fluid. IMPRESSION: Mild hyperinflation.  No acute findings. Electronically Signed   By: Leanna Battles M.D.   On: 08/20/2023 11:18    Procedures Procedures (including critical care time)  Medications Ordered in UC Medications  methylPREDNISolone acetate (DEPO-MEDROL) injection 40 mg (40 mg Intramuscular Given 08/20/23 1139)    Initial Impression / Assessment and Plan / UC Course  I have reviewed the triage vital signs and the nursing notes.  Pertinent labs & imaging results that were available during my care of the patient were reviewed by me and considered in my medical decision making (see chart for details).     Patient presents with symptoms likely from a viral upper respiratory infection.  Offered covid and flu testing but she declined this. Do not suspect underlying cardiopulmonary process. Patient is nontoxic appearing and not in need of emergent medical intervention.  Patient was very concerned about pneumonia so chest x-ray was completed that was negative for any acute cardiopulmonary process.  I am suspicious patient may have a mild form  of bronchitis.  Patient reports that she already has albuterol home so encouraged her to use this.  Patient reported that she does not like the side effects of steroids but I do think that steroids will be helpful for her to decrease inflammation.  Given patient took a dose of steroids today, will do low-dose 40 mg Depo-Medrol today but encouraged patient to not take any additional steroids.  She voiced understanding of this.  Patient states that she does not want take the medications prescribed at E-visit.  Encouraged patient to avoid prednisone given she received steroid today and took steroid yesterday.  Encouraged adequate fluids, rest, safe over-the-counter medications.  Return if symptoms fail to improve. Patient states understanding and is agreeable.  Discharged with PCP followup.  Final Clinical Impressions(s) / UC Diagnoses   Final diagnoses:  Acute cough  Viral illness  Shortness of breath     Discharge Instructions      You were given a steroid injection today.  Do not take any additional steroids.  Suspect you have a viral illness that should run its course as your chest x-ray was normal.  Continue supportive care, fluids, rest as we discussed.  Follow-up if any symptoms persist or worsen.    ED Prescriptions   None    PDMP not reviewed this encounter.   Gustavus Bryant, [place] 08/20/23 1147

## 2023-08-20 NOTE — ED Triage Notes (Signed)
 Pt reports productive cough, low grade temp x 4 days. She's been taking OTC meds and steroids. Concerned for pneumonia

## 2023-08-20 NOTE — Discharge Instructions (Signed)
 You were given a steroid injection today.  Do not take any additional steroids.  Suspect you have a viral illness that should run its course as your chest x-ray was normal.  Continue supportive care, fluids, rest as we discussed.  Follow-up if any symptoms persist or worsen.

## 2023-08-21 ENCOUNTER — Other Ambulatory Visit (HOSPITAL_COMMUNITY): Payer: Self-pay

## 2023-08-22 ENCOUNTER — Other Ambulatory Visit (HOSPITAL_COMMUNITY): Payer: Self-pay

## 2023-08-23 ENCOUNTER — Telehealth: Admitting: Physician Assistant

## 2023-08-23 ENCOUNTER — Encounter: Payer: Self-pay | Admitting: Family Medicine

## 2023-08-23 DIAGNOSIS — J22 Unspecified acute lower respiratory infection: Secondary | ICD-10-CM

## 2023-08-23 DIAGNOSIS — J069 Acute upper respiratory infection, unspecified: Secondary | ICD-10-CM

## 2023-08-23 MED ORDER — ALBUTEROL SULFATE (2.5 MG/3ML) 0.083% IN NEBU: 2.5000 mg | INHALATION_SOLUTION | Freq: Four times a day (QID) | RESPIRATORY_TRACT | 1 refills | Status: DC | PRN

## 2023-08-23 NOTE — Progress Notes (Signed)
 I have spent 5 minutes in review of e-visit questionnaire, review and updating patient chart, medical decision making and response to patient.   Piedad Climes, PA-C

## 2023-08-23 NOTE — Progress Notes (Signed)

## 2023-08-25 ENCOUNTER — Other Ambulatory Visit (HOSPITAL_COMMUNITY): Payer: Self-pay

## 2023-08-25 MED ORDER — NEBULIZER MASK ADULT/TUBING MISC
1.0000 [IU] | Freq: Four times a day (QID) | 0 refills | Status: DC | PRN
  Filled 2023-08-25: qty 50, fill #0

## 2023-08-25 MED ORDER — NEBULIZER SYSTEM ALL-IN-ONE MISC
0 refills | Status: DC
  Filled 2023-08-25: qty 1, fill #0

## 2023-08-25 MED ORDER — NEBULIZER MASK ADULT/TUBING MISC: 1.0000 [IU] | Freq: Four times a day (QID) | 0 refills | Status: DC | PRN

## 2023-08-25 MED ORDER — NEBULIZER SYSTEM ALL-IN-ONE MISC: 0 refills | Status: DC

## 2023-08-25 NOTE — Telephone Encounter (Signed)
 See my chart message

## 2023-08-25 NOTE — Telephone Encounter (Signed)
 Placed printed rxs at front office.

## 2023-08-28 ENCOUNTER — Other Ambulatory Visit (HOSPITAL_COMMUNITY): Payer: Self-pay | Admitting: Emergency Medicine

## 2023-08-28 ENCOUNTER — Other Ambulatory Visit: Payer: Self-pay

## 2023-09-05 ENCOUNTER — Other Ambulatory Visit (HOSPITAL_COMMUNITY): Payer: Self-pay

## 2023-09-08 ENCOUNTER — Other Ambulatory Visit (HOSPITAL_COMMUNITY): Payer: Self-pay

## 2023-09-11 DIAGNOSIS — M5416 Radiculopathy, lumbar region: Secondary | ICD-10-CM | POA: Diagnosis not present

## 2023-09-11 DIAGNOSIS — M546 Pain in thoracic spine: Secondary | ICD-10-CM | POA: Diagnosis not present

## 2023-09-19 ENCOUNTER — Other Ambulatory Visit (HOSPITAL_COMMUNITY): Payer: Self-pay

## 2023-09-25 ENCOUNTER — Other Ambulatory Visit (HOSPITAL_COMMUNITY): Payer: Self-pay

## 2023-09-25 DIAGNOSIS — J342 Deviated nasal septum: Secondary | ICD-10-CM | POA: Diagnosis not present

## 2023-09-25 DIAGNOSIS — J329 Chronic sinusitis, unspecified: Secondary | ICD-10-CM | POA: Diagnosis not present

## 2023-09-25 MED ORDER — CLINDAMYCIN HCL 300 MG PO CAPS
300.0000 mg | ORAL_CAPSULE | Freq: Three times a day (TID) | ORAL | 0 refills | Status: DC
  Filled 2023-09-25: qty 42, 14d supply, fill #0

## 2023-10-02 DIAGNOSIS — M5416 Radiculopathy, lumbar region: Secondary | ICD-10-CM | POA: Diagnosis not present

## 2023-10-31 ENCOUNTER — Other Ambulatory Visit (HOSPITAL_COMMUNITY): Payer: Self-pay

## 2023-11-03 IMAGING — MR MR BREAST BILAT WO/W CM
8 of 12 series · 32 of 48 positions shown · IV contrast (10ml gadavist)
Comparison: Previous exams including breast MRIs dated 04/12/2020
and 03/25/2019.

CLINICAL DATA: Annual high risk screening breast MRI. History of a
J75MG genetic mutation placing her at high risk for breast cancer
(greater than 50% lifetime risk).

Personal history of benign RIGHT breast biopsy, upper-outer
quadrant, in 7582 with pathology result of fibroadenoma.
EXAM:
BILATERAL BREAST MRI WITH AND WITHOUT CONTRAST
TECHNIQUE: Multiplanar, multisequence MR images of both breasts were obtained
prior to and following the intravenous administration of 10 ml of
Gadavist

[Series 2: t2_tirm_tra ipat (a-p) · axial · 3.0mm · 0.70mm/px · 1 of 55 slices shown]
[im 1/55]
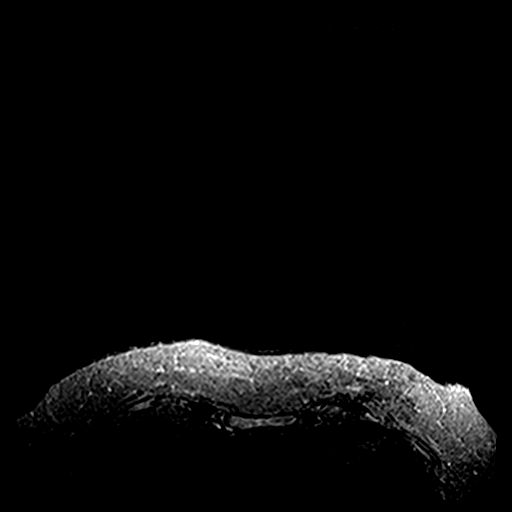

[Series 3: fl3d pre-cm no · axial · non-contrast · 1.2mm · 0.91mm/px · z∈[-54,+118]mm · 5 of 144 slices shown]
[im 1/144]
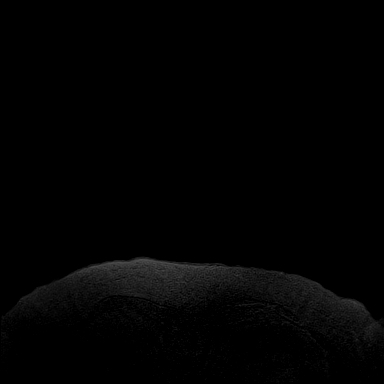
[im 36/144]
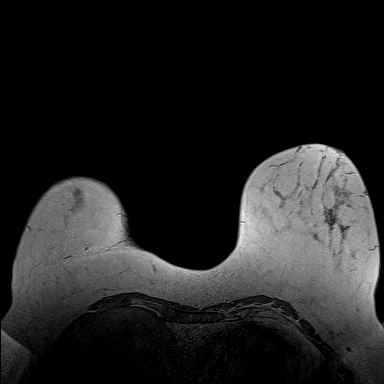
[im 72/144]
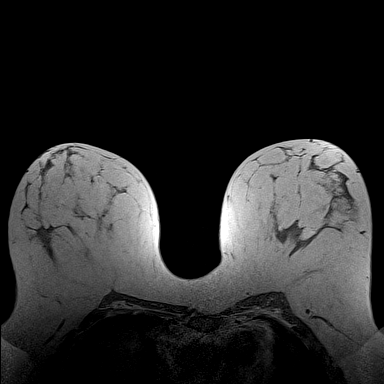
[im 108/144]
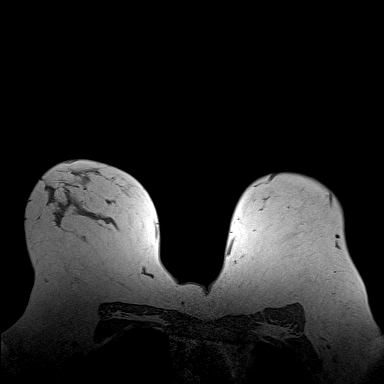
[im 144/144]
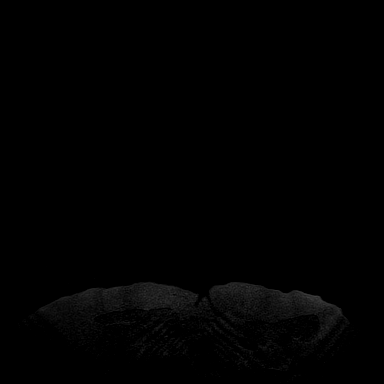

[Series 4: fl3d pre-cm · axial · non-contrast · 1.2mm · 0.91mm/px · z∈[-54,+118]mm · 5 of 144 slices shown]
[im 1/144]
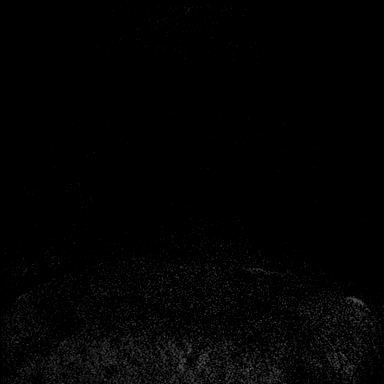
[im 36/144]
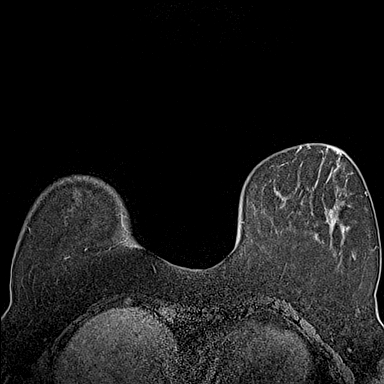
[im 72/144]
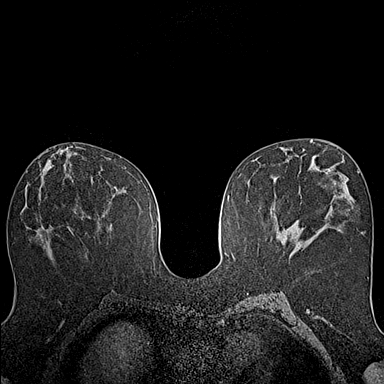
[im 108/144]
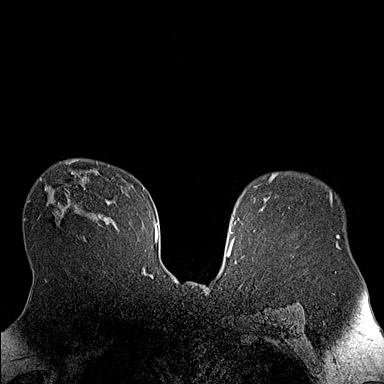
[im 144/144]
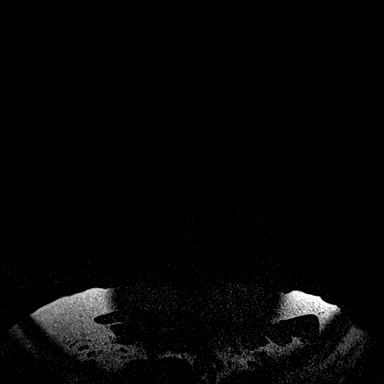

[Series 5: fl3d post-cm 20 · axial · 1.2mm · 0.91mm/px · z∈[-54,+118]mm · 5 of 144 slices shown (1 of 3)]
[im 1/144]
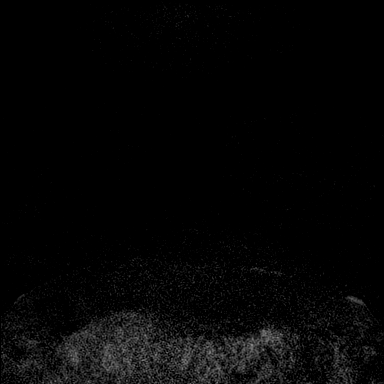
[im 36/144]
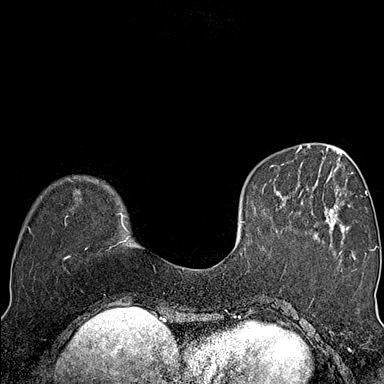
[im 72/144]
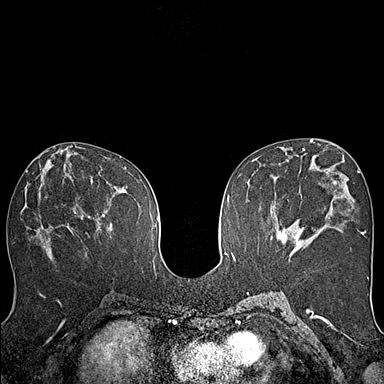
[im 108/144]
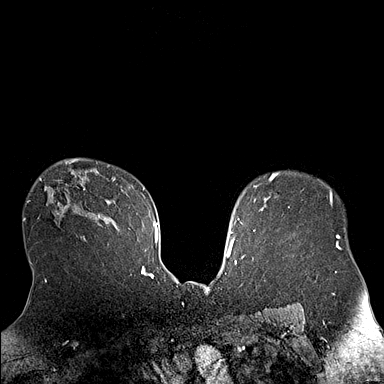
[im 144/144]
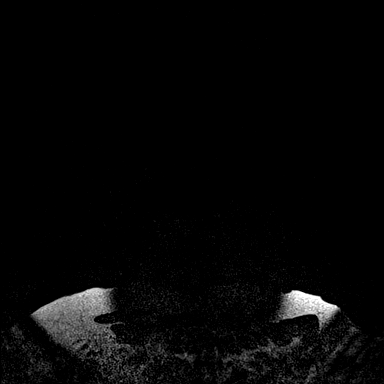

[Series 6: fl3d post-cm 20 · axial · 1.2mm · 0.91mm/px · z∈[-54,+118]mm · 5 of 144 slices shown (2 of 3)]
[im 1/144]
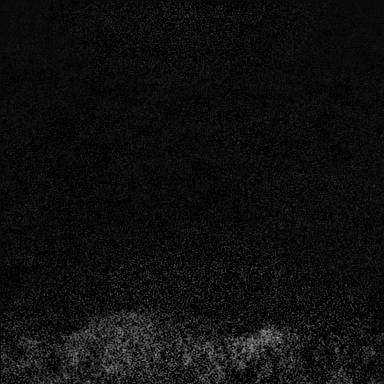
[im 36/144]
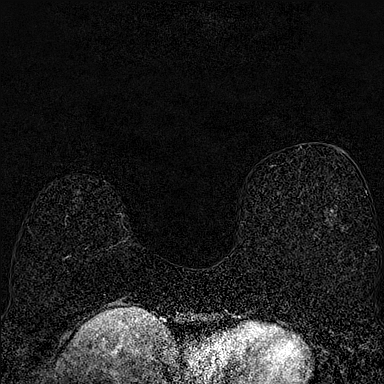
[im 72/144]
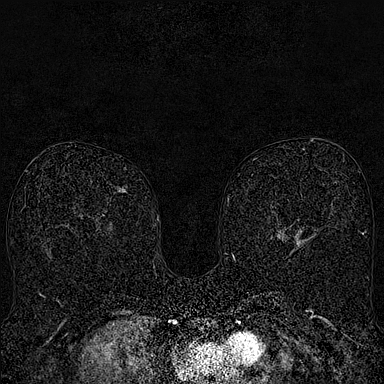
[im 108/144]
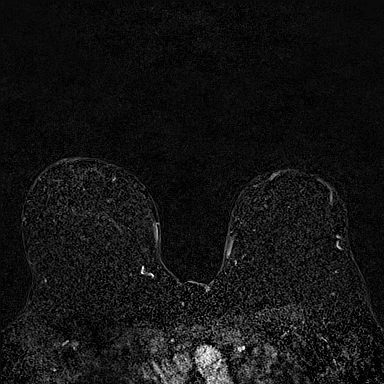
[im 144/144]
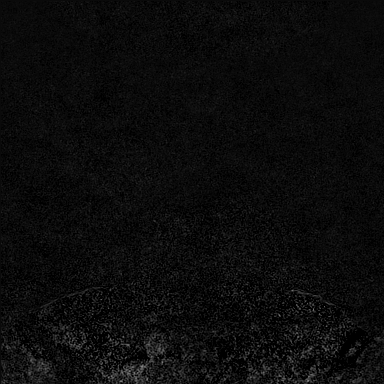

[Series 7: fl3d post-cm 20 · axial · 172.8mm · 0.91mm/px · 1 of 1 slices shown (3 of 3)]
[im 1/1]
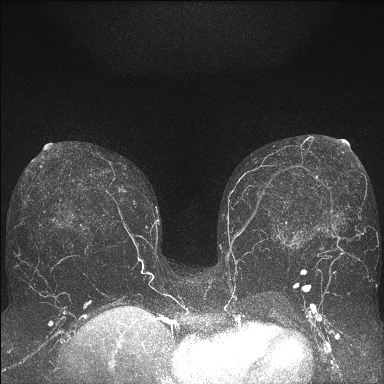

[Series 8: fl3d post-cm 3 · axial · 1.2mm · 0.91mm/px · z∈[-54,+118]mm · 6 of 144 slices shown (1 of 2)]
[im 1/144]
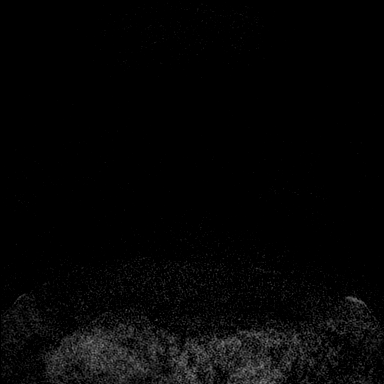
[im 29/144]
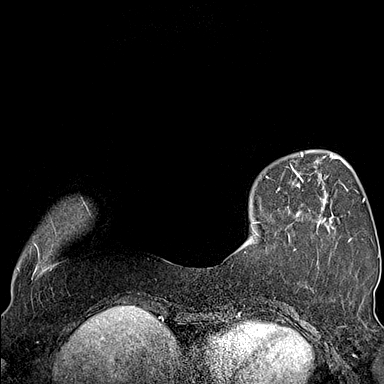
[im 58/144]
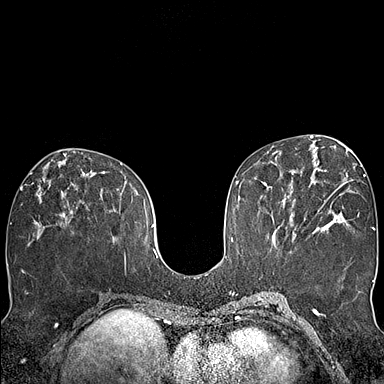
[im 86/144]
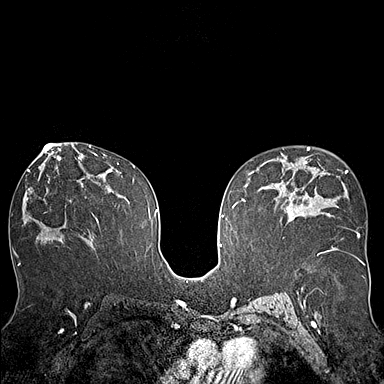
[im 115/144]
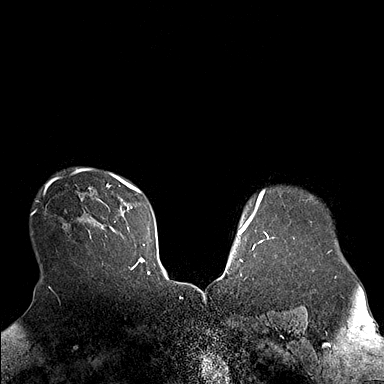
[im 144/144]
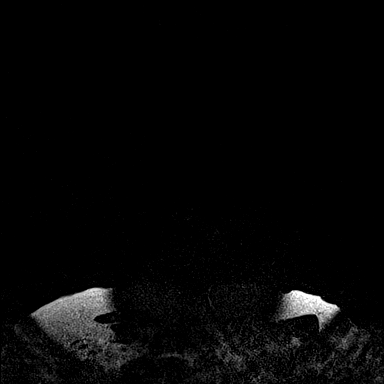

[Series 9: fl3d post-cm 3 · axial · 1.2mm · 0.91mm/px · z∈[-54,+48]mm · 4 of 144 slices shown (2 of 2)]
[im 1/144]
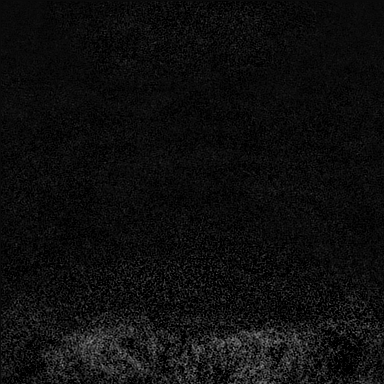
[im 29/144]
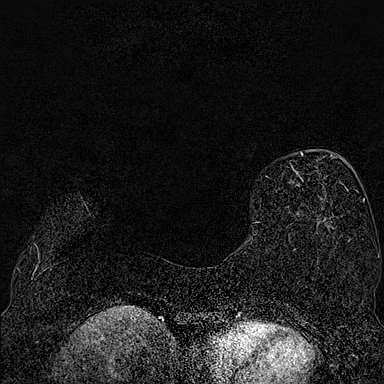
[im 58/144]
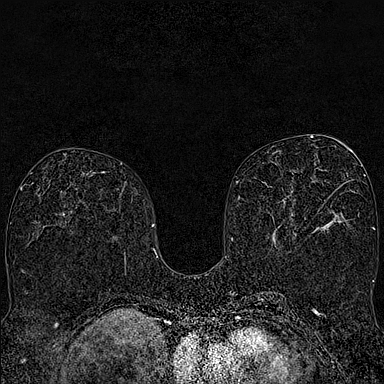
[im 86/144]
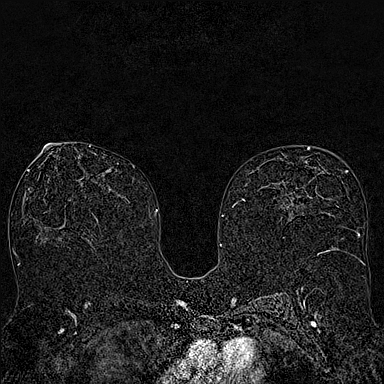

[32 of 48 positions shown; findings below may reference images not displayed]

Three-dimensional MR images were rendered by post-processing of the
original MR data on an independent workstation. The
three-dimensional MR images were interpreted, and findings are
reported in the following complete MRI report for this study. Three
dimensional images were evaluated at the independent interpreting
workstation using the DynaCAD thin client.
FINDINGS: Breast composition: b. Scattered fibroglandular tissue.

Background parenchymal enhancement: Moderate.

Right breast: No suspicious enhancing mass, suspicious non-mass
enhancement or secondary signs of malignancy. Stable biopsy-proven
fibroadenoma within the upper-outer quadrant of the RIGHT breast,
with associated biopsy clip. Small benign cysts.

Left breast: No suspicious enhancing mass, suspicious non-mass
enhancement or secondary signs of malignancy. Small benign cysts.

Lymph nodes: No abnormal appearing lymph nodes.

Ancillary findings:  None.
IMPRESSION: No evidence of malignancy within either breast.

RECOMMENDATION:
1. Annual screening mammograms. Next bilateral screening mammogram
will be due in [REDACTED]/Saturday November, 2021.
2. Annual screening breast MRIs. Given the history of a J75MG
genetic mutation, recommend the addition of annual screening breast
MRI to annual screening mammography. Per [employer] Society
guidelines, annual screening MRI of the breasts is recommended if a
risk assessment calculation for breast cancer, preferably using the
Tyrer-Cuzick or Gail model, measures greater than 20% or for
patients who are known or suspected to be positive for the breast
cancer gene.

BI-RADS CATEGORY  2: Benign.

## 2023-11-11 ENCOUNTER — Other Ambulatory Visit: Payer: Self-pay

## 2023-11-11 ENCOUNTER — Other Ambulatory Visit (HOSPITAL_BASED_OUTPATIENT_CLINIC_OR_DEPARTMENT_OTHER): Payer: Self-pay

## 2023-11-13 ENCOUNTER — Other Ambulatory Visit (HOSPITAL_COMMUNITY): Payer: Self-pay

## 2023-11-13 ENCOUNTER — Other Ambulatory Visit: Payer: Self-pay

## 2023-11-22 ENCOUNTER — Telehealth

## 2023-11-22 DIAGNOSIS — R3989 Other symptoms and signs involving the genitourinary system: Secondary | ICD-10-CM

## 2023-11-23 ENCOUNTER — Other Ambulatory Visit (HOSPITAL_COMMUNITY): Payer: Self-pay

## 2023-11-23 MED ORDER — CEPHALEXIN 500 MG PO CAPS
500.0000 mg | ORAL_CAPSULE | Freq: Two times a day (BID) | ORAL | 0 refills | Status: AC
  Filled 2023-11-23: qty 14, 7d supply, fill #0

## 2023-11-23 NOTE — Progress Notes (Signed)

## 2023-11-23 NOTE — Progress Notes (Signed)
 I have spent 5 minutes in review of e-visit questionnaire, review and updating patient chart, medical decision making and response to patient.   Piedad Climes, PA-C

## 2023-12-04 ENCOUNTER — Other Ambulatory Visit (HOSPITAL_COMMUNITY): Payer: Self-pay

## 2023-12-04 ENCOUNTER — Other Ambulatory Visit: Payer: Self-pay

## 2023-12-05 ENCOUNTER — Other Ambulatory Visit (HOSPITAL_COMMUNITY): Payer: Self-pay

## 2023-12-25 ENCOUNTER — Other Ambulatory Visit (HOSPITAL_COMMUNITY): Payer: Self-pay

## 2023-12-25 MED ORDER — AMPHETAMINE-DEXTROAMPHET ER 25 MG PO CP24
25.0000 mg | ORAL_CAPSULE | ORAL | 0 refills | Status: DC
  Filled 2024-01-02: qty 30, 30d supply, fill #0

## 2024-01-02 ENCOUNTER — Other Ambulatory Visit (HOSPITAL_COMMUNITY): Payer: Self-pay

## 2024-01-03 NOTE — Progress Notes (Unsigned)
 Heather Stewart D.[name] [address] [place] [address] Phone: [phone]   Assessment and Plan:     There are no diagnoses linked to this encounter.  ***   Pertinent previous records reviewed include ***    Follow Up: ***     Subjective:   I, Heather Stewart, am serving as a Neurosurgeon for Doctor Heather Stewart  Chief Complaint: OMT  HPI:   06/16/2023  Heather Stewart is a 38 y.o. female who presents to [facility] at [facility] today for f/u LBP. Pt was last seen by Dr. Joane on 05/08/23 and was prescribed cyclobenzaprine  and advised to use Tylenol /IBU. She was also referred to [facility]. She was later prescribed hydrocodone .   Today, pt reports continued mid and lower back pain. Has not been to PT since Dec d/t recent illness. Sx are constant. Hydrocodone  caused GI upset. Taking IBU, Tylenol , heat, soaking. Has been compliant with HEP provided by PT.    She does have a relationship with Dr. Darlis at [facility] neurosurgery and has an appointment scheduled with him on February 14 to consider back injections.   Dx imaging: 05/08/23 L-spine XR 11/12/21 L-spine MRI      Pertinent review of systems: No fevers or chills   Relevant historical information: Possible Marfan's  01/04/2024 Patient states   Relevant Historical Information: ***  Additional pertinent review of systems negative.   Current Outpatient Medications:    albuterol  (PROVENTIL ) (2.5 MG/3ML) 0.083% nebulizer solution, Take 3 mLs (2.5 mg total) by nebulization every 6 (six) hours as needed for wheezing or shortness of breath., Disp: 150 mL, Rfl: 1   albuterol  (VENTOLIN  HFA) 108 (90 Base) MCG/ACT inhaler, Inhale 1 puff into the lungs as directed., Disp: 6.7 g, Rfl: 1   amphetamine -dextroamphetamine  (ADDERALL  XR) 20 MG 24 hr capsule, Take 1 capsule (20 mg total) by mouth in the morning., Disp: 30 capsule, Rfl: 0   amphetamine -dextroamphetamine  (ADDERALL   XR) 25 MG 24 hr capsule, Take 1 capsule by mouth every morning., Disp: 30 capsule, Rfl: 0   amphetamine -dextroamphetamine  (ADDERALL  XR) 25 MG 24 hr capsule, Take 1 capsule by mouth in the morning., Disp: 30 capsule, Rfl: 0   amphetamine -dextroamphetamine  (ADDERALL  XR) 25 MG 24 hr capsule, Take 1 capsule by mouth in the morning., Disp: 30 capsule, Rfl: 0   amphetamine -dextroamphetamine  (ADDERALL  XR) 25 MG 24 hr capsule, Take 1 capsule by mouth in the morning., Disp: 30 capsule, Rfl: 0   amphetamine -dextroamphetamine  (ADDERALL  XR) 25 MG 24 hr capsule, Take 1 capsule by mouth every morning., Disp: 30 capsule, Rfl: 0   amphetamine -dextroamphetamine  (ADDERALL  XR) 25 MG 24 hr capsule, Take 1 capsule by mouth in the morning., Disp: 30 capsule, Rfl: 0   amphetamine -dextroamphetamine  (ADDERALL  XR) 25 MG 24 hr capsule, Take 1 capsule by mouth in the morning., Disp: 30 capsule, Rfl: 0   amphetamine -dextroamphetamine  (ADDERALL  XR) 25 MG 24 hr capsule, Take 1 capsule by mouth every morning., Disp: 30 capsule, Rfl: 0   ampicillin  (PRINCIPEN) 500 MG capsule, Take 1 capsule (500 mg total) by mouth 2 (two) times daily., Disp: 60 capsule, Rfl: 6   clobetasol  ointment (TEMOVATE ) 0.05 %, Apply a thin layer to the affected area(s) topically 2 (two) times daily. (Patient not taking: Reported on 08/20/2023), Disp: 15 g, Rfl: 0   clonazePAM  (KLONOPIN ) 0.5 MG tablet, Take 1 tablet (0.5 mg total) by mouth daily as needed., Disp: 30 tablet, Rfl: 0   clonazePAM  (KLONOPIN )  0.5 MG tablet, Take 1 tablet (0.5 mg total) by mouth 2 (two) times daily., Disp: 60 tablet, Rfl: 2   clonazePAM  (KLONOPIN ) 0.5 MG tablet, Take 1 tablet (0.5 mg total) by mouth 2 (two) times daily., Disp: 60 tablet, Rfl: 2   cyclobenzaprine  (FLEXERIL ) 10 MG tablet, Take 1 tablet (10 mg total) by mouth 3 (three) times daily as needed for muscle spasms. (Patient not taking: Reported on 08/20/2023), Disp: 30 tablet, Rfl: 1   DULoxetine  (CYMBALTA ) 60 MG capsule, Take 1  capsule (60 mg total) by mouth daily., Disp: 90 capsule, Rfl: 1   DULoxetine  (CYMBALTA ) 60 MG capsule, Take 1 capsule (60 mg total) by mouth daily., Disp: 90 capsule, Rfl: 5   lamoTRIgine  (LAMICTAL ) 100 MG tablet, Take 1 tablet (100 mg total) by mouth daily., Disp: 30 tablet, Rfl: 3   lamoTRIgine  (LAMICTAL ) 100 MG tablet, Take 1 tablet (100 mg total) by mouth daily., Disp: 30 tablet, Rfl: 5   lamoTRIgine  (LAMICTAL ) 100 MG tablet, Take 1 tablet (100 mg total) by mouth daily., Disp: 30 tablet, Rfl: 5   lamoTRIgine  (LAMICTAL ) 100 MG tablet, Take 1 tablet (100 mg total) by mouth daily., Disp: 30 tablet, Rfl: 5   ondansetron  (ZOFRAN -ODT) 8 MG disintegrating tablet, Take 1 tablet (8 mg total) by mouth every 8 (eight) hours as needed., Disp: 15 tablet, Rfl: 0   pantoprazole  (PROTONIX ) 40 MG tablet, Take 1 tablet (40 mg total) by mouth daily., Disp: 90 tablet, Rfl: 3   promethazine  (PHENERGAN ) 25 MG suppository, Place 1 suppository (25 mg total) rectally every 6 (six) hours as needed for nausea or vomiting., Disp: 12 each, Rfl: 0   QUEtiapine  (SEROQUEL ) 50 MG tablet, Take 1-2 tablets (50-100 mg total) by mouth at bedtime., Disp: 180 tablet, Rfl: 5   Objective:     There were no vitals filed for this visit.    There is no height or weight on file to calculate BMI.    Physical Exam:    ***   Electronically signed by:  Odis Stewart D.[name] 7:43 AM 01/03/24

## 2024-01-04 ENCOUNTER — Ambulatory Visit: Admitting: Sports Medicine

## 2024-01-04 ENCOUNTER — Other Ambulatory Visit (HOSPITAL_COMMUNITY): Payer: Self-pay

## 2024-01-04 VITALS — HR 81 | Ht 69.0 in | Wt 222.0 lb

## 2024-01-04 DIAGNOSIS — M9903 Segmental and somatic dysfunction of lumbar region: Secondary | ICD-10-CM | POA: Diagnosis not present

## 2024-01-04 DIAGNOSIS — M9901 Segmental and somatic dysfunction of cervical region: Secondary | ICD-10-CM

## 2024-01-04 DIAGNOSIS — Q8740 Marfan's syndrome, unspecified: Secondary | ICD-10-CM | POA: Diagnosis not present

## 2024-01-04 DIAGNOSIS — M545 Low back pain, unspecified: Secondary | ICD-10-CM | POA: Diagnosis not present

## 2024-01-04 DIAGNOSIS — G8929 Other chronic pain: Secondary | ICD-10-CM

## 2024-01-04 DIAGNOSIS — M9904 Segmental and somatic dysfunction of sacral region: Secondary | ICD-10-CM

## 2024-01-04 DIAGNOSIS — M9902 Segmental and somatic dysfunction of thoracic region: Secondary | ICD-10-CM

## 2024-01-04 DIAGNOSIS — M546 Pain in thoracic spine: Secondary | ICD-10-CM | POA: Diagnosis not present

## 2024-01-04 DIAGNOSIS — M9905 Segmental and somatic dysfunction of pelvic region: Secondary | ICD-10-CM | POA: Diagnosis not present

## 2024-01-04 MED ORDER — MELOXICAM 15 MG PO TABS
15.0000 mg | ORAL_TABLET | Freq: Every day | ORAL | 0 refills | Status: DC
  Filled 2024-01-04 (×2): qty 30, 30d supply, fill #0

## 2024-01-04 MED ORDER — MELOXICAM 15 MG PO TABS
15.0000 mg | ORAL_TABLET | Freq: Every day | ORAL | 0 refills | Status: DC
Start: 2024-01-04 — End: 2024-01-04
  Filled 2024-01-04: qty 30, 30d supply, fill #0

## 2024-01-04 NOTE — Patient Instructions (Signed)
 Low back HEP  - Start meloxicam 15 mg daily x2 weeks.  If still having pain after 2 weeks, complete 3rd-week of NSAID. May use remaining NSAID as needed once daily for pain control.  Do not to use additional over-the-counter NSAIDs (ibuprofen, naproxen, Advil, Aleve, etc.) while taking prescription NSAIDs.  May use Tylenol [phone] mg 2 to 3 times a day for breakthrough pain. 4 week follow up

## 2024-01-08 ENCOUNTER — Ambulatory Visit (INDEPENDENT_AMBULATORY_CARE_PROVIDER_SITE_OTHER): Admitting: Family Medicine

## 2024-01-08 ENCOUNTER — Encounter: Payer: Self-pay | Admitting: Family Medicine

## 2024-01-08 VITALS — BP 114/72 | HR 82 | Temp 98.3°F | Ht 68.5 in | Wt 221.4 lb

## 2024-01-08 DIAGNOSIS — Z1509 Genetic susceptibility to other malignant neoplasm: Secondary | ICD-10-CM

## 2024-01-08 DIAGNOSIS — K219 Gastro-esophageal reflux disease without esophagitis: Secondary | ICD-10-CM

## 2024-01-08 DIAGNOSIS — R5382 Chronic fatigue, unspecified: Secondary | ICD-10-CM

## 2024-01-08 DIAGNOSIS — K7689 Other specified diseases of liver: Secondary | ICD-10-CM

## 2024-01-08 DIAGNOSIS — L709 Acne, unspecified: Secondary | ICD-10-CM | POA: Diagnosis not present

## 2024-01-08 DIAGNOSIS — Z6833 Body mass index (BMI) 33.0-33.9, adult: Secondary | ICD-10-CM | POA: Diagnosis not present

## 2024-01-08 DIAGNOSIS — E7849 Other hyperlipidemia: Secondary | ICD-10-CM

## 2024-01-08 DIAGNOSIS — Z23 Encounter for immunization: Secondary | ICD-10-CM | POA: Diagnosis not present

## 2024-01-08 DIAGNOSIS — F331 Major depressive disorder, recurrent, moderate: Secondary | ICD-10-CM

## 2024-01-08 DIAGNOSIS — G8929 Other chronic pain: Secondary | ICD-10-CM

## 2024-01-08 DIAGNOSIS — Q8740 Marfan's syndrome, unspecified: Secondary | ICD-10-CM

## 2024-01-08 DIAGNOSIS — Z0001 Encounter for general adult medical examination with abnormal findings: Secondary | ICD-10-CM

## 2024-01-08 DIAGNOSIS — Z1501 Genetic susceptibility to malignant neoplasm of breast: Secondary | ICD-10-CM

## 2024-01-08 DIAGNOSIS — G4733 Obstructive sleep apnea (adult) (pediatric): Secondary | ICD-10-CM | POA: Diagnosis not present

## 2024-01-08 DIAGNOSIS — M359 Systemic involvement of connective tissue, unspecified: Secondary | ICD-10-CM

## 2024-01-08 DIAGNOSIS — E66811 Obesity, class 1: Secondary | ICD-10-CM | POA: Diagnosis not present

## 2024-01-08 DIAGNOSIS — F909 Attention-deficit hyperactivity disorder, unspecified type: Secondary | ICD-10-CM

## 2024-01-08 DIAGNOSIS — R8769 Abnormal cytological findings in specimens from other female genital organs: Secondary | ICD-10-CM

## 2024-01-08 DIAGNOSIS — Z1239 Encounter for other screening for malignant neoplasm of breast: Secondary | ICD-10-CM

## 2024-01-08 DIAGNOSIS — M545 Low back pain, unspecified: Secondary | ICD-10-CM

## 2024-01-08 NOTE — Patient Instructions (Addendum)
 Tdap today  Labs today  Good to see you today  I will order HST through [facility] diagnostics.  Return in 1 year for next physical.

## 2024-01-08 NOTE — Progress Notes (Addendum)
 Ph: [phone] Fax: [phone]   Patient ID: Heather Stewart, female    DOB: [birth year], 38 y.o.   MRN: [id number]  This visit was conducted in person.  BP 114/72   Pulse 82   Temp 98.3 F (36.8 C) (Oral)   Ht 5' 8.5 (1.74 m)   Wt 221 lb 6 oz (100.4 kg)   LMP 12/29/2023   SpO2 98%   BMI 33.17 kg/m    CC: CPE Subjective:   HPI: Heather Stewart is a 38 y.o. 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   S/p slipped disc 04/2023 - she had lumbar ESI and dry needling - this overall improved. Again 2 wks ago had another fall at home while carrying bag of cedar shavings in yard. Currently on meloxicam  course after evaluation by sports medicine.   GERD - on pantoprazole  40mg  daily with good effect.   Sees Dr [facility] ([facility] in [place]) for depression/anxiety and ADHD.  Known liver focal nodular hyperplasia. EGD in [place]  showed benign polyps and mild gastritis.   H/o CTD/marfan syndrome, has seen ophtho and cardiology [facility] --> [facility]rasekhar). Has seen pulm for excessive daytime sleepiness - home sleep study 2019 showed mild OSA. Has seen [facility]um ([facility]). H/o arthralgias, poor wound healing, peripheral neuropathy.  Had genetic testing through [facility] pediatric clinic --> Marfan syndrome.   S/p L eye surgery 2018 (lens replacement for dislocation and tension rings placed). Postponing R eye surgery.   OSA - last HST 12/2017 - mild OSA AHI 6.9, rec autoCPAP. She had trouble tolerating CPAP mask, also had trouble with oral appliance. Needs to be re evaluated for sleep apnea - last saw pulm 06/2022 Nell)   Preventative: Well woman exam with OBGYN Dr [facility], sees yearly with yearly paps. Also sees GYN for low grade vulvar dysplasia.  Breast cancer screening - rec yearly mammo/breast MRI, started age 86yo due to PALB2  mutation. H/o fibroadenoma age 21yo s/p biopsy. Saw onc Dr Layla 2020 due to positive test for PALB2 mutation. Gets breast mammo/MRI q6 months.  LMP - 12/29/2023 - becoming less heavy bleeds 1-2 days. May had period for 11 days.  Flu shot yearly  Td 2006, Tdap 2014, Tdap today COVID vaccine - Moderna 05/2019, 07/2019  MMR booster 03/2016  Seat belt use discussed  Sunscreen use discussed. No changing moles on skin.  Sleep - averaging 6-8 hours/night Non smoker. No vaping.  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 Interim medical history since our last visit reviewed. Allergies and medications reviewed and updated. Outpatient Medications Prior to Visit  Medication Sig Dispense Refill  . albuterol  (VENTOLIN  HFA) 108 (90 Base) MCG/ACT inhaler Inhale 1 puff into the lungs as directed. 6.7 g 1  . amphetamine -dextroamphetamine  (ADDERALL  XR) 25 MG 24 hr capsule Take 1 capsule by mouth every morning. 30 capsule 0  . ampicillin  (PRINCIPEN) 500 MG capsule Take 1 capsule (500 mg total) by mouth 2 (two) times daily. 60 capsule 6  . clonazePAM  (KLONOPIN ) 0.5 MG tablet Take 1 tablet (0.5 mg total) by mouth 2 (two)  times daily. 60 tablet 2  . DULoxetine  (CYMBALTA ) 60 MG capsule Take 1 capsule (60 mg total) by mouth daily. 90 capsule 5  . lamoTRIgine  (LAMICTAL ) 100 MG tablet Take 1 tablet (100 mg total) by mouth daily. (Patient taking differently: Take 100 mg by mouth daily. Takes 0.5 tablets daily) 30 tablet 5  . meloxicam  (MOBIC ) 15 MG tablet Take 1 tablet by mouth daily for 2 weeks.  If still in pain after 2 weeks, take 1 tablet daily for an additional 1 week. 30 tablet 0  . pantoprazole  (PROTONIX ) 40 MG tablet Take 1 tablet (40 mg total) by mouth  daily. 90 tablet 3  . QUEtiapine  (SEROQUEL ) 50 MG tablet Take 1-2 tablets (50-100 mg total) by mouth at bedtime. 180 tablet 5  . amphetamine -dextroamphetamine  (ADDERALL  XR) 20 MG 24 hr capsule Take 1 capsule (20 mg total) by mouth in the morning. 30 capsule 0  . amphetamine -dextroamphetamine  (ADDERALL  XR) 25 MG 24 hr capsule Take 1 capsule by mouth every morning. 30 capsule 0  . amphetamine -dextroamphetamine  (ADDERALL  XR) 25 MG 24 hr capsule Take 1 capsule by mouth in the morning. 30 capsule 0  . amphetamine -dextroamphetamine  (ADDERALL  XR) 25 MG 24 hr capsule Take 1 capsule by mouth in the morning. 30 capsule 0  . amphetamine -dextroamphetamine  (ADDERALL  XR) 25 MG 24 hr capsule Take 1 capsule by mouth in the morning. 30 capsule 0  . amphetamine -dextroamphetamine  (ADDERALL  XR) 25 MG 24 hr capsule Take 1 capsule by mouth every morning. 30 capsule 0  . amphetamine -dextroamphetamine  (ADDERALL  XR) 25 MG 24 hr capsule Take 1 capsule by mouth in the morning. 30 capsule 0  . amphetamine -dextroamphetamine  (ADDERALL  XR) 25 MG 24 hr capsule Take 1 capsule by mouth in the morning. 30 capsule 0  . clonazePAM  (KLONOPIN ) 0.5 MG tablet Take 1 tablet (0.5 mg total) by mouth daily as needed. 30 tablet 0  . clonazePAM  (KLONOPIN ) 0.5 MG tablet Take 1 tablet (0.5 mg total) by mouth 2 (two) times daily. 60 tablet 2  . DULoxetine  (CYMBALTA ) 60 MG capsule Take 1 capsule (60 mg total) by mouth daily. 90 capsule 1  . lamoTRIgine  (LAMICTAL ) 100 MG tablet Take 1 tablet (100 mg total) by mouth daily. 30 tablet 3  . lamoTRIgine  (LAMICTAL ) 100 MG tablet Take 1 tablet (100 mg total) by mouth daily. 30 tablet 5  . lamoTRIgine  (LAMICTAL ) 100 MG tablet Take 1 tablet (100 mg total) by mouth daily. 30 tablet 5  . ondansetron  (ZOFRAN -ODT) 8 MG disintegrating tablet Take 1 tablet (8 mg total) by mouth every 8 (eight) hours as needed. 15 tablet 0  . promethazine  (PHENERGAN ) 25 MG suppository Place 1 suppository (25 mg total) rectally every  6 (six) hours as needed for nausea or vomiting. 12 each 0   No facility-administered medications prior to visit.     Per HPI unless specifically indicated in ROS section below Review of Systems  Constitutional:  Negative for activity change, appetite change, chills, fatigue, fever and unexpected weight change.  HENT:  Negative for hearing loss.   Eyes:  Negative for visual disturbance.  Respiratory:  Negative for cough, chest tightness, shortness of breath and wheezing.   Cardiovascular:  Positive for palpitations (due to POTS). Negative for chest pain and leg swelling.  Gastrointestinal:  Positive for constipation (intermittent - managed with stool softeners) and diarrhea (recent norovirus). Negative for abdominal distention, abdominal pain, blood in stool, nausea and vomiting.  Genitourinary:  Negative for difficulty urinating and hematuria.  Musculoskeletal:  Negative for  arthralgias, myalgias and neck pain.  Skin:  Negative for rash.  Neurological:  Negative for dizziness, seizures, syncope and headaches.  Hematological:  Negative for adenopathy. Bruises/bleeds easily.  Psychiatric/Behavioral:  Positive for dysphoric mood. The patient is nervous/anxious.     Objective:  BP 114/72   Pulse 82   Temp 98.3 F (36.8 C) (Oral)   Ht 5' 8.5 (1.74 m)   Wt 221 lb 6 oz (100.4 kg)   LMP 12/29/2023   SpO2 98%   BMI 33.17 kg/m   Wt Readings from Last 3 Encounters:  01/08/24 221 lb 6 oz (100.4 kg)  01/04/24 222 lb (100.7 kg)  06/16/23 205 lb (93 kg)      Physical Exam Vitals and nursing note reviewed.  Constitutional:      Appearance: Normal appearance. She is not ill-appearing.  HENT:     Head: Normocephalic and atraumatic.     Right Ear: Tympanic membrane, ear canal and external ear normal. There is no impacted cerumen.     Left Ear: Tympanic membrane, ear canal and external ear normal. There is no impacted cerumen.     Mouth/Throat:     Mouth: Mucous membranes are moist.      Pharynx: Oropharynx is clear. No oropharyngeal exudate or posterior oropharyngeal erythema.  Eyes:     General:        Right eye: No discharge.        Left eye: No discharge.     Extraocular Movements: Extraocular movements intact.     Conjunctiva/sclera: Conjunctivae normal.     Pupils: Pupils are equal, round, and reactive to light.  Neck:     Thyroid : No thyroid  mass or thyromegaly.     Comments: Neck circ 39cm Cardiovascular:     Rate and Rhythm: Normal rate and regular rhythm.     Pulses: Normal pulses.     Heart sounds: Normal heart sounds. No murmur heard. Pulmonary:     Effort: Pulmonary effort is normal. No respiratory distress.     Breath sounds: Normal breath sounds. No wheezing, rhonchi or rales.  Abdominal:     General: Bowel sounds are normal. There is no distension.     Palpations: Abdomen is soft. There is no mass.     Tenderness: There is no abdominal tenderness. There is no guarding or rebound.     Hernia: No hernia is present.  Musculoskeletal:     Cervical back: Normal range of motion and neck supple. No rigidity.     Right lower leg: No edema.     Left lower leg: No edema.  Lymphadenopathy:     Cervical: No cervical adenopathy.  Skin:    General: Skin is warm and dry.     Findings: No rash.  Neurological:     General: No focal deficit present.     Mental Status: She is alert. Mental status is at baseline.  Psychiatric:        Mood and Affect: Mood normal.        Behavior: Behavior normal.       Results for orders placed or performed during the hospital encounter of 06/09/23  Lipase, blood   Collection Time: 06/09/23 11:01 PM  Result Value Ref Range   Lipase 29 11 - 51 U/L  Comprehensive metabolic panel   Collection Time: 06/09/23 11:01 PM  Result Value Ref Range   Sodium 136 135 - 145 mmol/L   Potassium 3.9 3.5 - 5.1 mmol/L   Chloride 109 98 -  111 mmol/L   CO2 20 (L) 22 - 32 mmol/L   Glucose, Bld 134 (H) 70 - 99 mg/dL   BUN 15 6 - 20 mg/dL    Creatinine, Ser 9.23 0.44 - 1.00 mg/dL   Calcium 9.3 8.9 - 89.6 mg/dL   Total Protein 7.3 6.5 - 8.1 g/dL   Albumin 4.0 3.5 - 5.0 g/dL   AST 21 15 - 41 U/L   ALT 22 0 - 44 U/L   Alkaline Phosphatase 56 38 - 126 U/L   Total Bilirubin 0.8 0.0 - 1.2 mg/dL   GFR, Estimated >39 >39 mL/min   Anion gap 7 5 - 15  CBC   Collection Time: 06/09/23 11:01 PM  Result Value Ref Range   WBC 19.4 (H) 4.0 - 10.5 K/uL   RBC 5.33 (H) 3.87 - 5.11 MIL/uL   Hemoglobin 16.0 (H) 12.0 - 15.0 g/dL   HCT 53.8 (H) 63.9 - 53.9 %   MCV 86.5 80.0 - 100.0 fL   MCH 30.0 26.0 - 34.0 pg   MCHC 34.7 30.0 - 36.0 g/dL   RDW 87.7 88.4 - 84.4 %   Platelets 319 150 - 400 K/uL   nRBC 0.0 0.0 - 0.2 %    Assessment & Plan:   Problem List Items Addressed This Visit     Encounter for general adult medical examination with abnormal findings - Primary (Chronic)   Preventative protocols reviewed and updated unless pt declined. Discussed healthy diet and lifestyle.       Marfan syndrome (Chronic)   Diagnosed by [facility] pediatric genetic clinic.  Regularly sees ophthalmology and cardiology.  Now seeing sports medicine.       Hyperlipidemia due to dietary fat intake (Chronic)   Update FLP.        Relevant Orders   Comprehensive metabolic panel with GFR   Lipid panel   MDD (major depressive disorder), recurrent episode, moderate (HCC)   Sees psychiatry      Attention deficit hyperactivity disorder (ADHD)   Sees psychiatrist      GERD   Continues daily pantoprazole       RESOLVED: Connective tissue disorder (HCC)   Relevant Orders   CBC with Differential/Platelet   Monoallelic mutation of PALB2 gene   Adult acne   On amoxicillin  through dermatology       Focal nodular hyperplasia of liver   Breast cancer screening, high risk patient   Gets yearly mammo and breast MRIs through GYN.       Low grade squamous intraepithelial lesion (LGSIL) of vulva   Sees GYN for yearly paps      Obesity, Class I, BMI  30-34.9   Encourage healthy diet and lifestyle choices to affect sustainable weight loss. Good effect on wegovy  until insurance stopped covering this.       OSA (obstructive sleep apnea)   H/o this. Needs to be re- evaluated - last saw pulm 06/2022 Nell) - plan was HST and then f/u.  I will order updated HST.       Low back pain   Currently being evaluated for this by sports med, recently started meloxicam  course      Chronic fatigue   Check labs for reversible causes of fatigue.  Order HST as per above.       Relevant Orders   TSH   CBC with Differential/Platelet   Vitamin B12   VITAMIN D  25 Hydroxy (Vit-D Deficiency, Fractures)   Other Visit Diagnoses  Need for Tdap vaccination       Relevant Orders   Tdap vaccine greater than or equal to 7yo IM (Completed)        No orders of the defined types were placed in this encounter.   Orders Placed This Encounter  Procedures  . Tdap vaccine greater than or equal to 7yo IM  . Comprehensive metabolic panel with GFR  . Lipid panel  . TSH  . CBC with Differential/Platelet  . Vitamin B12  . VITAMIN D  25 Hydroxy (Vit-D Deficiency, Fractures)    Patient Instructions  Tdap today  Labs today  Good to see you today  I will order HST through [facility].  Return in 1 year for next physical.   Follow up plan: Return in about 1 year (around 01/07/2025) for follow up visit.  [name], MD

## 2024-01-09 ENCOUNTER — Telehealth: Payer: Self-pay

## 2024-01-09 ENCOUNTER — Ambulatory Visit: Payer: Self-pay | Admitting: Family Medicine

## 2024-01-09 DIAGNOSIS — M545 Low back pain, unspecified: Secondary | ICD-10-CM | POA: Insufficient documentation

## 2024-01-09 DIAGNOSIS — E538 Deficiency of other specified B group vitamins: Secondary | ICD-10-CM | POA: Insufficient documentation

## 2024-01-09 DIAGNOSIS — G4733 Obstructive sleep apnea (adult) (pediatric): Secondary | ICD-10-CM | POA: Insufficient documentation

## 2024-01-09 DIAGNOSIS — R5382 Chronic fatigue, unspecified: Secondary | ICD-10-CM | POA: Insufficient documentation

## 2024-01-09 LAB — CBC WITH DIFFERENTIAL/PLATELET
Basophils Absolute: 0.1 K/uL (ref 0.0–0.1)
Basophils Relative: 0.8 % (ref 0.0–3.0)
Eosinophils Absolute: 0.2 K/uL (ref 0.0–0.7)
Eosinophils Relative: 3.1 % (ref 0.0–5.0)
HCT: 43 % (ref 36.0–46.0)
Hemoglobin: 14.3 g/dL (ref 12.0–15.0)
Lymphocytes Relative: 21.8 % (ref 12.0–46.0)
Lymphs Abs: 1.7 K/uL (ref 0.7–4.0)
MCHC: 33.2 g/dL (ref 30.0–36.0)
MCV: 89.4 fl (ref 78.0–100.0)
Monocytes Absolute: 0.5 K/uL (ref 0.1–1.0)
Monocytes Relative: 6.3 % (ref 3.0–12.0)
Neutro Abs: 5.4 K/uL (ref 1.4–7.7)
Neutrophils Relative %: 68 % (ref 43.0–77.0)
Platelets: 295 K/uL (ref 150.0–400.0)
RBC: 4.81 Mil/uL (ref 3.87–5.11)
RDW: 13.2 % (ref 11.5–15.5)
WBC: 7.9 K/uL (ref 4.0–10.5)

## 2024-01-09 LAB — COMPREHENSIVE METABOLIC PANEL WITH GFR
ALT: 18 U/L (ref 0–35)
AST: 15 U/L (ref 0–37)
Albumin: 4.4 g/dL (ref 3.5–5.2)
Alkaline Phosphatase: 48 U/L (ref 39–117)
BUN: 9 mg/dL (ref 6–23)
CO2: 28 meq/L (ref 19–32)
Calcium: 9.5 mg/dL (ref 8.4–10.5)
Chloride: 103 meq/L (ref 96–112)
Creatinine, Ser: 0.71 mg/dL (ref 0.40–1.20)
GFR: 108.63 mL/min (ref >60.00)
Glucose, Bld: 83 mg/dL (ref 70–99)
Potassium: 4.1 meq/L (ref 3.5–5.1)
Sodium: 137 meq/L (ref 135–145)
Total Bilirubin: 0.6 mg/dL (ref 0.2–1.2)
Total Protein: 6.9 g/dL (ref 6.0–8.3)

## 2024-01-09 LAB — LIPID PANEL
Cholesterol: 210 mg/dL — ABNORMAL HIGH (ref 0–200)
HDL: 39.7 mg/dL (ref >39.00)
LDL Cholesterol: 136 mg/dL — ABNORMAL HIGH (ref 0–99)
NonHDL: 170.41
Total CHOL/HDL Ratio: 5
Triglycerides: 174 mg/dL — ABNORMAL HIGH (ref 0.0–149.0)
VLDL: 34.8 mg/dL (ref 0.0–40.0)

## 2024-01-09 LAB — VITAMIN B12: Vitamin B-12: 157 pg/mL — ABNORMAL LOW (ref 211–911)

## 2024-01-09 LAB — TSH: TSH: 0.98 u[IU]/mL (ref 0.35–5.50)

## 2024-01-09 LAB — VITAMIN D 25 HYDROXY (VIT D DEFICIENCY, FRACTURES): VITD: 33.57 ng/mL (ref 30.00–100.00)

## 2024-01-09 NOTE — Assessment & Plan Note (Addendum)
 Check labs for reversible causes of fatigue.  Order HST as per above.

## 2024-01-09 NOTE — Assessment & Plan Note (Addendum)
 On amoxicillin  through dermatology

## 2024-01-09 NOTE — Assessment & Plan Note (Signed)
 Diagnosed by [facility] pediatric genetic clinic.  Regularly sees ophthalmology and cardiology.  Now seeing sports medicine.

## 2024-01-09 NOTE — Assessment & Plan Note (Signed)
 Encourage healthy diet and lifestyle choices to affect sustainable weight loss. Good effect on wegovy  until insurance stopped covering this.

## 2024-01-09 NOTE — Telephone Encounter (Signed)
 Order faxed with office note and demographics to number on order sheet.

## 2024-01-09 NOTE — Assessment & Plan Note (Signed)
 Sees GYN for yearly paps

## 2024-01-09 NOTE — Assessment & Plan Note (Signed)
Sees psychiatrist.  

## 2024-01-09 NOTE — Assessment & Plan Note (Signed)
 Sees psychiatry

## 2024-01-09 NOTE — Assessment & Plan Note (Signed)
 Gets yearly mammo and breast MRIs through GYN.

## 2024-01-09 NOTE — Assessment & Plan Note (Signed)
 Currently being evaluated for this by sports med, recently started meloxicam  course

## 2024-01-09 NOTE — Assessment & Plan Note (Signed)
 Continues daily pantoprazole .

## 2024-01-09 NOTE — Assessment & Plan Note (Addendum)
Update FLP.  

## 2024-01-09 NOTE — Assessment & Plan Note (Signed)
 H/o this. Needs to be re- evaluated - last saw pulm 06/2022 Nell) - plan was HST and then f/u.  I will order updated HST.

## 2024-01-09 NOTE — Assessment & Plan Note (Signed)
 Preventative protocols reviewed and updated unless pt declined. Discussed healthy diet and lifestyle.

## 2024-01-11 ENCOUNTER — Other Ambulatory Visit (HOSPITAL_COMMUNITY): Payer: Self-pay

## 2024-01-11 ENCOUNTER — Ambulatory Visit (INDEPENDENT_AMBULATORY_CARE_PROVIDER_SITE_OTHER)

## 2024-01-11 ENCOUNTER — Ambulatory Visit

## 2024-01-11 ENCOUNTER — Telehealth: Payer: Self-pay

## 2024-01-11 DIAGNOSIS — E538 Deficiency of other specified B group vitamins: Secondary | ICD-10-CM

## 2024-01-11 DIAGNOSIS — F900 Attention-deficit hyperactivity disorder, predominantly inattentive type: Secondary | ICD-10-CM | POA: Diagnosis not present

## 2024-01-11 DIAGNOSIS — F32A Depression, unspecified: Secondary | ICD-10-CM | POA: Diagnosis not present

## 2024-01-11 DIAGNOSIS — F411 Generalized anxiety disorder: Secondary | ICD-10-CM | POA: Diagnosis not present

## 2024-01-11 MED ORDER — LAMOTRIGINE 25 MG PO TABS
50.0000 mg | ORAL_TABLET | Freq: Every day | ORAL | 1 refills | Status: DC
  Filled 2024-01-11 – 2024-01-30 (×2): qty 180, 90d supply, fill #0
  Filled 2024-05-01: qty 180, 90d supply, fill #1

## 2024-01-11 MED ORDER — CYANOCOBALAMIN 1000 MCG/ML IJ SOLN
1000.0000 ug | INTRAMUSCULAR | 11 refills | Status: AC
  Filled 2024-01-11: qty 1, 30d supply, fill #0
  Filled 2024-03-07: qty 1, 30d supply, fill #1
  Filled 2024-04-12: qty 1, 30d supply, fill #2

## 2024-01-11 MED ORDER — AMPHETAMINE-DEXTROAMPHET ER 25 MG PO CP24
25.0000 mg | ORAL_CAPSULE | Freq: Every morning | ORAL | 0 refills | Status: DC
  Filled 2024-01-30 – 2024-02-01 (×2): qty 30, 30d supply, fill #0

## 2024-01-11 MED ORDER — "NEEDLE (DISP) 25G X 1"" MISC"
0 refills | Status: AC
  Filled 2024-01-11: qty 3, 90d supply, fill #0
  Filled 2024-03-07: qty 3, 90d supply, fill #1

## 2024-01-11 MED ORDER — CLONAZEPAM 0.5 MG PO TABS
0.5000 mg | ORAL_TABLET | Freq: Two times a day (BID) | ORAL | 2 refills | Status: AC
  Filled 2024-01-11: qty 60, 30d supply, fill #0
  Filled 2024-04-12: qty 60, 30d supply, fill #1

## 2024-01-11 MED ORDER — SYRINGE/NEEDLE (DISP) 25G X 5/8" 3 ML MISC
0 refills | Status: AC
  Filled 2024-01-11: qty 3, 90d supply, fill #0
  Filled 2024-03-07: qty 3, 90d supply, fill #1

## 2024-01-11 MED ORDER — AMPHETAMINE-DEXTROAMPHET ER 25 MG PO CP24: 25.0000 mg | ORAL_CAPSULE | Freq: Every morning | ORAL | 0 refills | Status: DC

## 2024-01-11 MED ORDER — CYANOCOBALAMIN 1000 MCG/ML IJ SOLN
1000.0000 ug | Freq: Once | INTRAMUSCULAR | Status: AC
  Administered 2024-01-11: 1000 ug via INTRAMUSCULAR

## 2024-01-11 MED ORDER — QUETIAPINE FUMARATE 50 MG PO TABS
50.0000 mg | ORAL_TABLET | Freq: Every day | ORAL | 1 refills | Status: AC
  Filled 2024-01-11: qty 180, 180d supply, fill #0
  Filled 2024-01-30: qty 180, 90d supply, fill #0

## 2024-01-11 MED ORDER — DULOXETINE HCL 60 MG PO CPEP
60.0000 mg | ORAL_CAPSULE | Freq: Every day | ORAL | 1 refills | Status: DC
  Filled 2024-01-11 – 2024-01-30 (×2): qty 90, 90d supply, fill #0
  Filled 2024-05-01: qty 90, 90d supply, fill #1

## 2024-01-11 MED ORDER — AMPHETAMINE-DEXTROAMPHET ER 25 MG PO CP24
25.0000 mg | ORAL_CAPSULE | Freq: Every morning | ORAL | 0 refills | Status: DC
  Filled 2024-03-27: qty 30, 30d supply, fill #0

## 2024-01-11 NOTE — Addendum Note (Signed)
 Addended by: Juletta Berhe on: 01/11/2024 01:52 PM   Modules accepted: Orders

## 2024-01-11 NOTE — Telephone Encounter (Signed)
 Pt came in for first vitamin b 12 inj as nurse visit and pt did not have any problems or concerns. 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 Pt request cb after Dr KANDICE has reviewed. Pt request med and syringes sent to [facility] outpatient pharmacy on [facility] [address]. Sending note to Dr KANDICE.

## 2024-01-11 NOTE — Telephone Encounter (Signed)
 Noted. E-scribed rx for syringe/needle and needles.

## 2024-01-11 NOTE — Telephone Encounter (Signed)
 Left message on voicemail, per dpr, notifying pt Dr KANDICE sent rx for vit B12 and I sent rxs for syringe/needle combo to draw up vit B12 and 1 needle to switch to administer vit B12. Encouraged pt to call back with any questions.

## 2024-01-11 NOTE — Progress Notes (Addendum)
 Per orders of Dr. [name] who is out of office and Adina Crandall NP who is in office, injection of vitamin b 12 given by Laray Arenas in right deltoid. Patient tolerated injection well. Since pts firt injection pt waited 15 mins with no problem or concerns. Patient will make appointment for 1 month.

## 2024-01-11 NOTE — Telephone Encounter (Signed)
 Ok to do this. I have sent b12 medication to pharmacy. Please send appropriate rest of supplies (needle/syringes to draw med and needles to injection).

## 2024-01-22 ENCOUNTER — Ambulatory Visit: Payer: Self-pay

## 2024-01-22 NOTE — Telephone Encounter (Signed)
 FYI Only or Action Required?: FYI only for provider.  Patient was last seen in primary care on 01/08/2024 by Rilla Baller, MD.  Called Nurse Triage reporting Covid Positive.  Symptoms began several days ago.  Interventions attempted: OTC medications: mucinex.  Symptoms are: gradually worsening.  Triage Disposition: Call PCP Within 24 Hours  Patient/caregiver understands and will follow disposition?:    Copied from CRM #[id number]. Topic: Clinical - Red Word Triage >> Jan 22, 2024  4:25 PM [place] E wrote: [facility] that prompted transfer to Nurse Triage: Patient tested positive for Covid, having chest tightness, cough, and sinus pressure. Symptoms going on since yesterday morning. Reason for Disposition  [1] HIGH RISK patient AND [2] influenza exposure within the last 7 days AND [3] ONE OR MORE respiratory symptoms: cough, sore throat, runny or stuffy nose  Answer Assessment - Initial Assessment Questions 1. COVID-19 DIAGNOSIS: How do you know that you have COVID? (e.g., positive lab test or self-test, diagnosed by doctor or NP/PA, symptoms after exposure).     Positive home test 2. COVID-19 EXPOSURE: Was there any known exposure to COVID before the symptoms began? CDC Definition of close contact: within 6 feet (2 meters) for a total of 15 minutes or more over a 24-hour period.      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 ONSET: When did the COVID-19 symptoms start?      Two days ago 4. WORST SYMPTOM: What is your worst symptom? (e.g., cough, fever, shortness of breath, muscle aches)     Cough, sore throat, sinus pain 5. COUGH: Do you have a cough? If Yes, ask: How bad is the cough?       Non productive cough, white when she does cough anything up 6. FEVER: Do you have a fever? If Yes, ask: What is your temperature, how was it measured, and when did it start?     Denies, but has had chills 7. RESPIRATORY STATUS: Describe your breathing? (e.g., normal; shortness of  breath, wheezing, unable to speak)     States that she is a little wheezy and using her breathing treatments 8. BETTER-SAME-WORSE: Are you getting better, staying the same or getting worse compared to yesterday?  If getting worse, ask, In what way?     worse 9. OTHER SYMPTOMS: Do you have any other symptoms?  (e.g., chills, fatigue, headache, loss of smell or taste, muscle pain, sore throat)     Sinus pain 10. HIGH RISK DISEASE: Do you have any chronic medical problems? (e.g., asthma, heart or lung disease, weak immune system, obesity, etc.)       Marfan's syndrome 11. VACCINE: Have you had the COVID-19 vaccine? If Yes, ask: Which one, how many shots, when did you get it?       Last vaccine 2021 12. PREGNANCY: Is there any chance you are pregnant? When was your last menstrual period?       denies 31. O2 SATURATION MONITOR:  Do you use an oxygen saturation monitor (pulse oximeter) at home? If Yes, ask What is your reading (oxygen level) today? What is your usual oxygen saturation reading? (e.g., 95%)       unsure  Protocols used: Coronavirus (COVID-19) Diagnosed or Suspected-A-AH

## 2024-01-23 ENCOUNTER — Telehealth: Admitting: Family

## 2024-01-23 DIAGNOSIS — R051 Acute cough: Secondary | ICD-10-CM | POA: Diagnosis not present

## 2024-01-23 DIAGNOSIS — J028 Acute pharyngitis due to other specified organisms: Secondary | ICD-10-CM | POA: Diagnosis not present

## 2024-01-23 DIAGNOSIS — U071 COVID-19: Secondary | ICD-10-CM | POA: Diagnosis not present

## 2024-01-23 DIAGNOSIS — H9201 Otalgia, right ear: Secondary | ICD-10-CM | POA: Diagnosis not present

## 2024-01-23 MED ORDER — CEFDINIR 300 MG PO CAPS: 300.0000 mg | ORAL_CAPSULE | Freq: Two times a day (BID) | ORAL | 0 refills | Status: AC

## 2024-01-23 MED ORDER — BENZONATATE 200 MG PO CAPS: 200.0000 mg | ORAL_CAPSULE | Freq: Two times a day (BID) | ORAL | 0 refills | Status: DC | PRN

## 2024-01-23 NOTE — Progress Notes (Signed)
 Virtual Visit via Video note  I connected with [name] on 01/23/24 at home by video and verified that I am speaking with the correct person using two identifiers.The provider, Ginger Patrick, FNP is located in their office at time of visit.  I discussed the limitations, risks, security and privacy concerns of performing an evaluation and management service by video and the availability of in person appointments. I also discussed with the patient that there may be a patient responsible charge related to this service. The patient expressed understanding and agreed to proceed.  Subjective: PCP: Rilla Baller, MD  Chief Complaint  Patient presents with   Acute Visit    COVID + - reports chest tightness, coughing, sinus congestion/pain. Denies fever.    HPI   Discussed the use of AI scribe software for clinical note transcription with the patient, who gave verbal consent to proceed.  History of Present Illness Heather Stewart is a 38 year old female with recurrent COVID-19 infections who presents with COVID-19 symptoms.  She tested positive for COVID-19 after symptoms began on Friday with a dry cough, initially thought to be allergies. By Saturday, she developed chest heaviness and a sensation of something in her throat. Her symptoms progressed to a burning sensation in her sinuses, which she experiences with each COVID-19 infection. This is her fourth or fifth infection.  She works as a Engineer, civil (consulting) in the ER, frequently exposed to illnesses, which she believes contributes to her recurrent infections. She has been taking vitamin C and zinc. Her workplace allows symptomatic staff to return to work if fever-free.  Her symptoms have remained consistent since Saturday, with significant exhaustion. She experienced shortness of breath and sweating after walking to her chicken coop, accompanied by wheezing, which has since resolved. She does not have asthma but has a history of vaping, which  she recently quit. She used an albuterol  inhaler last night, which provided some relief.  She experiences an uncontrollable itch in the soft palate, associated with congestion. She has been taking Mucinex but does not attribute the itching to it. She reports a sore throat and left ear pain. No known fever, but she has been taking medication regularly.  She is currently taking ampicillin  for acne and has a history of intolerance to Augmentin , causing stomach cramps and diarrhea. She cannot take fluoroquinolones due to Marfan's syndrome. Tessalon  Perles is effective for her nighttime cough.   ROS: Per HPI  Current Outpatient Medications:    albuterol  (VENTOLIN  HFA) 108 (90 Base) MCG/ACT inhaler, Inhale 1 puff into the lungs as directed., Disp: 6.7 g, Rfl: 1   amphetamine -dextroamphetamine  (ADDERALL  XR) 25 MG 24 hr capsule, Take 1 capsule by mouth every morning., Disp: 30 capsule, Rfl: 0   amphetamine -dextroamphetamine  (ADDERALL  XR) 25 MG 24 hr capsule, Take 1 capsule by mouth in the morning., Disp: 30 capsule, Rfl: 0   [START ON 03/20/2024] amphetamine -dextroamphetamine  (ADDERALL  XR) 25 MG 24 hr capsule, Take 1 capsule by mouth in the morning., Disp: 30 capsule, Rfl: 0   [START ON 02/21/2024] amphetamine -dextroamphetamine  (ADDERALL  XR) 25 MG 24 hr capsule, Take 1 capsule by mouth every morning., Disp: 30 capsule, Rfl: 0   ampicillin  (PRINCIPEN) 500 MG capsule, Take 1 capsule (500 mg total) by mouth 2 (two) times daily., Disp: 60 capsule, Rfl: 6   benzonatate  (TESSALON ) 200 MG capsule, Take 1 capsule (200 mg total) by mouth 2 (two) times daily as needed for cough., Disp: 20 capsule, Rfl: 0   cefdinir  (OMNICEF )  300 MG capsule, Take 1 capsule (300 mg total) by mouth 2 (two) times daily for 10 days., Disp: 20 capsule, Rfl: 0   clonazePAM  (KLONOPIN ) 0.5 MG tablet, Take 1 tablet (0.5 mg total) by mouth 2 (two) times daily., Disp: 60 tablet, Rfl: 2   clonazePAM  (KLONOPIN ) 0.5 MG tablet, Take 1 tablet (0.5  mg total) by mouth 2 (two) times daily., Disp: 60 tablet, Rfl: 2   cyanocobalamin  (VITAMIN B12) 1000 MCG/ML injection, Inject 1 mL (1,000 mcg total) into the muscle every 30 (thirty) days., Disp: 1 mL, Rfl: 11   DULoxetine  (CYMBALTA ) 60 MG capsule, Take 1 capsule (60 mg total) by mouth daily., Disp: 90 capsule, Rfl: 5   DULoxetine  (CYMBALTA ) 60 MG capsule, Take 1 capsule (60 mg total) by mouth daily., Disp: 90 capsule, Rfl: 1   LAMICTAL  25 MG tablet, Take 2 tablets (50 mg total) by mouth daily., Disp: 180 tablet, Rfl: 1   lamoTRIgine  (LAMICTAL ) 100 MG tablet, Take 1 tablet (100 mg total) by mouth daily. (Patient taking differently: Take 100 mg by mouth daily. Takes 0.5 tablets daily), Disp: 30 tablet, Rfl: 5   meloxicam  (MOBIC ) 15 MG tablet, Take 1 tablet by mouth daily for 2 weeks.  If still in pain after 2 weeks, take 1 tablet daily for an additional 1 week., Disp: 30 tablet, Rfl: 0   NEEDLE, DISP, 25 G 25G X 1 MISC, Use as instructed to inject vitamin B 12 once a month, Disp: 50 each, Rfl: 0   pantoprazole  (PROTONIX ) 40 MG tablet, Take 1 tablet (40 mg total) by mouth daily., Disp: 90 tablet, Rfl: 3   QUEtiapine  (SEROQUEL ) 50 MG tablet, Take 1-2 tablets (50-100 mg total) by mouth at bedtime., Disp: 180 tablet, Rfl: 5   QUEtiapine  (SEROQUEL ) 50 MG tablet, Take 1-2 tablets (50-100 mg total) by mouth at bedtime., Disp: 180 tablet, Rfl: 1   SYRINGE-NEEDLE, DISP, 3 ML 25G X 5/8 3 ML MISC, Use as instructed to inject vitamin B12 once a month, Disp: 50 each, Rfl: 0  Observations/Objective: Physical Exam Constitutional:      General: She is not in acute distress.    Appearance: Normal appearance. She is ill-appearing. She is not toxic-appearing.  Pulmonary:     Effort: Pulmonary effort is normal.  Neurological:     General: No focal deficit present.     Mental Status: She is alert and oriented to person, place, and time.  Psychiatric:        Mood and Affect: Mood normal.        Behavior:  Behavior normal.        Thought Content: Thought content normal.     Assessment and Plan:  Assessment and Plan Assessment & Plan COVID-19 infection COVID-19 infection with symptoms starting on Saturday, including dry cough, chest heaviness, sinus burning, and exhaustion. Symptoms have not improved significantly by day four. No fever reported, but experiencing shortness of breath and sweating with exertion. No wheezing currently, but was present earlier. No history of asthma. Using albuterol  inhaler with some relief. Mucus color change from white to green, indicating possible bacterial superinfection. Differential includes possible strep throat or ear infection, but unable to confirm without physical examination. - Advise to remain fever-free for greater than 24 hours without medication and have overall symptom improvement before returning to work. - Prescribe cefdinir  for potential bacterial superinfection due to change in mucus color. - Prescribe benzonatate  (Tessalon  Perles) for nighttime cough relief. - Continue taking Mucinex for symptom management. -  Advise to rest and monitor symptoms.  Allergic rhinitis Chronic allergic rhinitis with symptoms exacerbated by high pollen levels. Current symptoms include itching of the soft palate and sinus congestion. Possible sinus drainage causing discomfort. - Continue current management with vitamin C and zinc for immune support.  Recording duration: 12 minutes   Follow Up Instructions: Return if symptoms worsen or fail to improve, for f/u PCP if no improvement in symptoms.   I discussed the assessment and treatment plan with the patient. The patient was provided an opportunity to ask questions and all were answered. The patient agreed with the plan and demonstrated an understanding of the instructions.   The patient was advised to call back or seek an in-person evaluation if the symptoms worsen or if the condition fails to improve as  anticipated.  The above assessment and management plan was discussed with the patient. The patient verbalized understanding of and has agreed to the management plan. Patient is aware to call the clinic if symptoms persist or worsen. Patient is aware when to return to the clinic for a follow-up visit. Patient educated on when it is appropriate to go to the emergency department.     Ginger Patrick, MSN, APRN, FNP-C [facility]

## 2024-01-24 ENCOUNTER — Other Ambulatory Visit: Payer: Self-pay | Admitting: Medical Genetics

## 2024-01-25 NOTE — Progress Notes (Deleted)
 Ben Jackson D[name] [address] [place] [address] Phone: [phone]   Assessment and Plan:     ***    Pertinent previous records reviewed include ***   Follow Up: ***     Subjective:   I, Heather Stewart, am serving as a Neurosurgeon for Doctor Morene Mace   Chief Complaint: OMT   HPI:    06/16/2023  [name] is a 38 y.o. female who presents to [facility] at [facility] today for f/u LBP. Pt was last seen by Dr. Joane on 05/08/23 and was prescribed cyclobenzaprine  and advised to use Tylenol /IBU. She was also referred to [facility]. She was later prescribed hydrocodone .   Today, pt reports continued mid and lower back pain. Has not been to PT since Dec d/t recent illness. Sx are constant. Hydrocodone  caused GI upset. Taking IBU, Tylenol , heat, soaking. Has been compliant with HEP provided by PT.    She does have a relationship with Dr. Darlis at [facility] neurosurgery and has an appointment scheduled with him on February 14 to consider back injections.   Dx imaging: 05/08/23 L-spine XR 11/12/21 L-spine MRI      Pertinent review of systems: No fevers or chills   Relevant historical information: Possible Marfan's   01/04/2024 Patient states hx of 2 spinal injections. Hx of marfan's . Is interested in OMT. Works in the ED just needs some relief   01/26/2024 Patient states    Relevant Historical Information: Marfan syndrome, IBS  Additional pertinent review of systems negative.   Current Outpatient Medications:    albuterol  (VENTOLIN  HFA) 108 (90 Base) MCG/ACT inhaler, Inhale 1 puff into the lungs as directed., Disp: 6.7 g, Rfl: 1   amphetamine -dextroamphetamine  (ADDERALL  XR) 25 MG 24 hr capsule, Take 1 capsule by mouth every morning., Disp: 30 capsule, Rfl: 0   amphetamine -dextroamphetamine  (ADDERALL  XR) 25 MG 24 hr capsule, Take 1 capsule by mouth in the morning., Disp: 30 capsule, Rfl: 0   [START  ON 03/20/2024] amphetamine -dextroamphetamine  (ADDERALL  XR) 25 MG 24 hr capsule, Take 1 capsule by mouth in the morning., Disp: 30 capsule, Rfl: 0   [START ON 02/21/2024] amphetamine -dextroamphetamine  (ADDERALL  XR) 25 MG 24 hr capsule, Take 1 capsule by mouth every morning., Disp: 30 capsule, Rfl: 0   ampicillin  (PRINCIPEN) 500 MG capsule, Take 1 capsule (500 mg total) by mouth 2 (two) times daily., Disp: 60 capsule, Rfl: 6   benzonatate  (TESSALON ) 200 MG capsule, Take 1 capsule (200 mg total) by mouth 2 (two) times daily as needed for cough., Disp: 20 capsule, Rfl: 0   cefdinir  (OMNICEF ) 300 MG capsule, Take 1 capsule (300 mg total) by mouth 2 (two) times daily for 10 days., Disp: 20 capsule, Rfl: 0   clonazePAM  (KLONOPIN ) 0.5 MG tablet, Take 1 tablet (0.5 mg total) by mouth 2 (two) times daily., Disp: 60 tablet, Rfl: 2   clonazePAM  (KLONOPIN ) 0.5 MG tablet, Take 1 tablet (0.5 mg total) by mouth 2 (two) times daily., Disp: 60 tablet, Rfl: 2   cyanocobalamin  (VITAMIN B12) 1000 MCG/ML injection, Inject 1 mL (1,000 mcg total) into the muscle every 30 (thirty) days., Disp: 1 mL, Rfl: 11   DULoxetine  (CYMBALTA ) 60 MG capsule, Take 1 capsule (60 mg total) by mouth daily., Disp: 90 capsule, Rfl: 5   DULoxetine  (CYMBALTA ) 60 MG capsule, Take 1 capsule (60 mg total) by mouth daily., Disp: 90 capsule, Rfl: 1   LAMICTAL  25 MG tablet, Take 2  tablets (50 mg total) by mouth daily., Disp: 180 tablet, Rfl: 1   lamoTRIgine  (LAMICTAL ) 100 MG tablet, Take 1 tablet (100 mg total) by mouth daily. (Patient taking differently: Take 100 mg by mouth daily. Takes 0.5 tablets daily), Disp: 30 tablet, Rfl: 5   meloxicam  (MOBIC ) 15 MG tablet, Take 1 tablet by mouth daily for 2 weeks.  If still in pain after 2 weeks, take 1 tablet daily for an additional 1 week., Disp: 30 tablet, Rfl: 0   NEEDLE, DISP, 25 G 25G X 1 MISC, Use as instructed to inject vitamin B 12 once a month, Disp: 50 each, Rfl: 0   pantoprazole  (PROTONIX ) 40 MG  tablet, Take 1 tablet (40 mg total) by mouth daily., Disp: 90 tablet, Rfl: 3   QUEtiapine  (SEROQUEL ) 50 MG tablet, Take 1-2 tablets (50-100 mg total) by mouth at bedtime., Disp: 180 tablet, Rfl: 5   QUEtiapine  (SEROQUEL ) 50 MG tablet, Take 1-2 tablets (50-100 mg total) by mouth at bedtime., Disp: 180 tablet, Rfl: 1   SYRINGE-NEEDLE, DISP, 3 ML 25G X 5/8 3 ML MISC, Use as instructed to inject vitamin B12 once a month, Disp: 50 each, Rfl: 0   Objective:     There were no vitals filed for this visit.    There is no height or weight on file to calculate BMI.    Physical Exam:    ***   Electronically signed by:  Odis Mace [name] 7:57 AM 01/25/24

## 2024-01-26 ENCOUNTER — Ambulatory Visit: Admitting: Sports Medicine

## 2024-01-30 ENCOUNTER — Other Ambulatory Visit
Admission: RE | Admit: 2024-01-30 | Discharge: 2024-01-30 | Disposition: A | Discharge status: Home / Self Care | Payer: Self-pay | Source: Ambulatory Visit | Attending: Medical Genetics | Admitting: Medical Genetics

## 2024-01-30 ENCOUNTER — Other Ambulatory Visit: Payer: Self-pay

## 2024-01-30 ENCOUNTER — Other Ambulatory Visit (HOSPITAL_COMMUNITY): Payer: Self-pay

## 2024-02-01 ENCOUNTER — Other Ambulatory Visit (HOSPITAL_COMMUNITY): Payer: Self-pay

## 2024-02-01 ENCOUNTER — Encounter: Payer: Self-pay | Admitting: Cardiology

## 2024-02-06 LAB — GENECONNECT MOLECULAR SCREEN: Genetic Analysis Overall Interpretation: NEGATIVE

## 2024-02-08 ENCOUNTER — Encounter: Payer: Self-pay | Admitting: Family Medicine

## 2024-02-08 DIAGNOSIS — G4733 Obstructive sleep apnea (adult) (pediatric): Secondary | ICD-10-CM

## 2024-02-09 NOTE — Telephone Encounter (Signed)
Printed and placed in your box for review

## 2024-02-09 NOTE — Progress Notes (Signed)
 [name] D.[name] [address] [place] [address] Phone: [phone]   Assessment and Plan:     1. Chronic left-sided low back pain without sciatica (Primary) 2. Pain in thoracic spine 3. Marfan syndrome -Chronic with exacerbation, subsequent visit - Continued pain in middle, lower back without specific MOI.  Patient states that pain is more significant and debilitating at this time affecting day-to-day activities and ability to work. - Patient has had no improvement in symptoms despite NSAID course, HEP, OMT. - Recommend further evaluation with neurosurgery as epidural CSI were beneficial in 2023, however most recent 2 epidural injections in 2025 have not provided significant relief - Continue HEP for low back and core - Again offered physical therapy, but patient is not interested due to cost - Will provide work note to allow patient to be a light duty until evaluated by neurosurgery - Needs Robaxin  500 mg 3 times daily as needed for muscle spasms.  Refill provided - Use meloxicam  15 mg daily as needed for pain.  Recommend limiting chronic NSAIDs to 1-2 doses per week to prevent long-term side effects.   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  History of ectopia tentis as teenager.  No cardiac abnormalities.  -MRI 07/07/2023 showed:  IMPRESSION: 1. Small left foraminal disc protrusion with annular fissure at L3-4, slightly increased in prominence from prior. The exiting left L3 nerve root could potentially be affected. 2. Central disc protrusion at L1-2, increased in size from prior, but no significant stenosis or overt impingement. 3. Moderate bilateral facet hypertrophy at L4-5 and L5-S1, mildly progressed from prior.   Pertinent previous records reviewed include lumbar MRI 07/07/2023   Follow Up: As needed if no improvement or worsening of symptoms.  Could follow-up with myself or Dr.  Joane to discuss physical therapy versus medications to better control chronic pain such as gabapentin versus Cymbalta    Subjective:   I, Heather Stewart am a scribe for Dr. Leonce.     Chief Complaint: OMT   HPI:    06/16/2023  Heather Stewart is a 38 y.o. female who presents to [facility] at [facility] today for f/u LBP. Pt was last seen by Dr. Joane on 05/08/23 and was prescribed cyclobenzaprine  and advised to use Tylenol /IBU. She was also referred to [facility]. She was later prescribed hydrocodone .   Today, pt reports continued mid and lower back pain. Has not been to PT since Dec d/t recent illness. Sx are constant. Hydrocodone  caused GI upset. Taking IBU, Tylenol , heat, soaking. Has been compliant with HEP provided by PT.    She does have a relationship with Dr. Darlis at [facility] neurosurgery and has an appointment scheduled with him on February 14 to consider back injections.   Dx imaging: 05/08/23 L-spine XR 11/12/21 L-spine MRI      Pertinent review of systems: No fevers or chills   Relevant historical information: Possible Marfan's   01/04/2024 Patient states hx of 2 spinal injections. Hx of marfan's . Is interested in OMT. Works in the ED just needs some relief   02/12/2024 Patient states is hurting today. It isn't getting better it feels like it is getting worse. Feels like she needs a note for work stating that she needs to be on light duty. 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     Relevant Historical Information: Marfan syndrome, IBS  Additional pertinent review of systems negative.   Current Outpatient Medications:    albuterol  (VENTOLIN  HFA) 108 (90 Base) MCG/ACT inhaler, Inhale 1 puff into the lungs as directed., Disp: 6.7 g, Rfl: 1   amphetamine -dextroamphetamine  (ADDERALL  XR) 25 MG 24 hr capsule, Take 1 capsule by mouth every morning., Disp: 30 capsule, Rfl: 0   amphetamine -dextroamphetamine  (ADDERALL  XR) 25 MG 24 hr  capsule, Take 1 capsule by mouth in the morning., Disp: 30 capsule, Rfl: 0   [START ON 03/20/2024] amphetamine -dextroamphetamine  (ADDERALL  XR) 25 MG 24 hr capsule, Take 1 capsule by mouth in the morning., Disp: 30 capsule, Rfl: 0   [START ON 02/21/2024] amphetamine -dextroamphetamine  (ADDERALL  XR) 25 MG 24 hr capsule, Take 1 capsule by mouth every morning., Disp: 30 capsule, Rfl: 0   ampicillin  (PRINCIPEN) 500 MG capsule, Take 1 capsule (500 mg total) by mouth 2 (two) times daily., Disp: 60 capsule, Rfl: 6   benzonatate  (TESSALON ) 200 MG capsule, Take 1 capsule (200 mg total) by mouth 2 (two) times daily as needed for cough., Disp: 20 capsule, Rfl: 0   clonazePAM  (KLONOPIN ) 0.5 MG tablet, Take 1 tablet (0.5 mg total) by mouth 2 (two) times daily., Disp: 60 tablet, Rfl: 2   clonazePAM  (KLONOPIN ) 0.5 MG tablet, Take 1 tablet (0.5 mg total) by mouth 2 (two) times daily., Disp: 60 tablet, Rfl: 2   cyanocobalamin  (VITAMIN B12) 1000 MCG/ML injection, Inject 1 mL (1,000 mcg total) into the muscle every 30 (thirty) days., Disp: 1 mL, Rfl: 11   DULoxetine  (CYMBALTA ) 60 MG capsule, Take 1 capsule (60 mg total) by mouth daily., Disp: 90 capsule, Rfl: 5   DULoxetine  (CYMBALTA ) 60 MG capsule, Take 1 capsule (60 mg total) by mouth daily., Disp: 90 capsule, Rfl: 1   lamoTRIgine  (LAMICTAL ) 100 MG tablet, Take 1 tablet (100 mg total) by mouth daily. (Patient taking differently: Take 100 mg by mouth daily. Takes 0.5 tablets daily), Disp: 30 tablet, Rfl: 5   lamoTRIgine  (LAMICTAL ) 25 MG tablet, Take 2 tablets (50 mg total) by mouth daily., Disp: 180 tablet, Rfl: 1   meloxicam  (MOBIC ) 15 MG tablet, Take 1 tablet by mouth daily for 2 weeks.  If still in pain after 2 weeks, take 1 tablet daily for an additional 1 week., Disp: 30 tablet, Rfl: 0   methocarbamol  (ROBAXIN ) 500 MG tablet, Take 1 tablet (500 mg total) by mouth 3 (three) times daily as needed for muscle spasms., Disp: 60 tablet, Rfl: 0   NEEDLE, DISP, 25 G 25G X 1  MISC, Use as instructed to inject vitamin B 12 once a month, Disp: 50 each, Rfl: 0   pantoprazole  (PROTONIX ) 40 MG tablet, Take 1 tablet (40 mg total) by mouth daily., Disp: 90 tablet, Rfl: 3   QUEtiapine  (SEROQUEL ) 50 MG tablet, Take 1-2 tablets (50-100 mg total) by mouth at bedtime., Disp: 180 tablet, Rfl: 5   QUEtiapine  (SEROQUEL ) 50 MG tablet, Take 1-2 tablets (50-100 mg total) by mouth at bedtime., Disp: 180 tablet, Rfl: 1   SYRINGE-NEEDLE, DISP, 3 ML 25G X 5/8 3 ML MISC, Use as instructed to inject vitamin B12 once a month, Disp: 50 each, Rfl: 0   Objective:     Vitals:   02/12/24 0950  BP: 110/70  Pulse: (!) 121  SpO2: 98%  Weight: 220 lb (99.8 kg)  Height: 5' 8.5 (1.74 m)      Body mass index is 32.96 kg/m.    Physical Exam:    Gen: Appears well, nad, nontoxic and  pleasant Psych: Alert and oriented, appropriate mood and affect Neuro: sensation intact, strength is 5/5 in upper and lower extremities, muscle tone wnl Skin: no susupicious lesions or rashes  Back - Normal skin, Spine with normal alignment and no deformity.   No tenderness to vertebral process palpation.   Bilateral thoracic and lumbar paraspinous muscles are not tender and without spasm NTTP gluteal musculature Straight leg raise negative Trendelenberg negative Piriformis Test negative Gait normal    Electronically signed by:  Odis Mace [name] 10:26 AM 02/12/24

## 2024-02-12 ENCOUNTER — Ambulatory Visit: Admitting: Sports Medicine

## 2024-02-12 ENCOUNTER — Other Ambulatory Visit (HOSPITAL_COMMUNITY): Payer: Self-pay

## 2024-02-12 VITALS — BP 110/70 | HR 121 | Ht 68.5 in | Wt 220.0 lb

## 2024-02-12 DIAGNOSIS — M546 Pain in thoracic spine: Secondary | ICD-10-CM

## 2024-02-12 DIAGNOSIS — Q8740 Marfan's syndrome, unspecified: Secondary | ICD-10-CM

## 2024-02-12 DIAGNOSIS — G8929 Other chronic pain: Secondary | ICD-10-CM | POA: Diagnosis not present

## 2024-02-12 DIAGNOSIS — M545 Low back pain, unspecified: Secondary | ICD-10-CM | POA: Diagnosis not present

## 2024-02-12 MED ORDER — METHOCARBAMOL 500 MG PO TABS
500.0000 mg | ORAL_TABLET | Freq: Three times a day (TID) | ORAL | 0 refills | Status: DC | PRN
  Filled 2024-02-12: qty 60, 20d supply, fill #0

## 2024-02-12 NOTE — Addendum Note (Signed)
 Addended by: RILLA BALLER on: 02/12/2024 08:34 AM   Modules accepted: Orders

## 2024-02-12 NOTE — Patient Instructions (Addendum)
 Light duty work note good for two weeks. Refill robaxin . Follow up as needed.

## 2024-02-15 ENCOUNTER — Ambulatory Visit

## 2024-02-15 ENCOUNTER — Other Ambulatory Visit (HOSPITAL_COMMUNITY): Payer: Self-pay

## 2024-02-15 ENCOUNTER — Institutional Professional Consult (permissible substitution): Admitting: Plastic Surgery

## 2024-02-15 ENCOUNTER — Other Ambulatory Visit: Payer: Self-pay | Admitting: Obstetrics & Gynecology

## 2024-02-15 VITALS — BP 102/79 | HR 97 | Ht 68.0 in | Wt 216.6 lb

## 2024-02-15 DIAGNOSIS — Z6832 Body mass index (BMI) 32.0-32.9, adult: Secondary | ICD-10-CM

## 2024-02-15 DIAGNOSIS — Z1231 Encounter for screening mammogram for malignant neoplasm of breast: Secondary | ICD-10-CM

## 2024-02-15 DIAGNOSIS — Q8740 Marfan's syndrome, unspecified: Secondary | ICD-10-CM | POA: Diagnosis not present

## 2024-02-15 DIAGNOSIS — R21 Rash and other nonspecific skin eruption: Secondary | ICD-10-CM | POA: Diagnosis not present

## 2024-02-15 DIAGNOSIS — E65 Localized adiposity: Secondary | ICD-10-CM

## 2024-02-15 DIAGNOSIS — Z720 Tobacco use: Secondary | ICD-10-CM

## 2024-02-15 DIAGNOSIS — N62 Hypertrophy of breast: Secondary | ICD-10-CM

## 2024-02-15 DIAGNOSIS — M47816 Spondylosis without myelopathy or radiculopathy, lumbar region: Secondary | ICD-10-CM | POA: Diagnosis not present

## 2024-02-15 DIAGNOSIS — L987 Excessive and redundant skin and subcutaneous tissue: Secondary | ICD-10-CM | POA: Diagnosis not present

## 2024-02-15 DIAGNOSIS — M793 Panniculitis, unspecified: Secondary | ICD-10-CM | POA: Diagnosis not present

## 2024-02-15 DIAGNOSIS — M25552 Pain in left hip: Secondary | ICD-10-CM | POA: Diagnosis not present

## 2024-02-15 NOTE — Progress Notes (Signed)
 Plastic & Reconstructive Surgery New Patient Visit  Patient: Heather Stewart MRN: [id number] Date: 02/15/24  Referring Physician: @REFERRINGPHYSICIAN @   Chief Complaint: Excess skin   History of Present Illness:  This is a 38 y.o. female with PMH and PSH as presented below who presents for consultation for body contouring following weight loss.  The patient underwent GLP-1 therapy. Prior to the therapy, her highest weight was 240 lbs. Since then, she has lost almost 40 lbs. Her weight today is 216 lbs and Body mass index is 32.93 kg/m.. With the weight loss she has noted excess skin involving abdomen. Pt complains of rashes in between the skin folds which persist despite antifungal creams and powders. The pannus gets in the way and affects her daily activities. No known nutritional issues. No hernias. Other abdominal surgeries include laparoscopic cholecystectomy. Pt has Marfans syndrome and does Vape. Functional status is good. Today, Body mass index is 32.93 kg/m.SABRA She has also considered a breast reduction but wants to be a double D after the operation.  Past Medical History: Past Medical History:  Diagnosis Date   Acne    ADHD (attention deficit hyperactivity disorder), combined type    dx as child and then received through psychiatry - reviewed records from psych: no recent w/u.  was on vyvanse  during nursing school   Adjustment disorder    Anxiety    Chronic back pain    thoracic and lumbar - s/p unrevealing NSG eval    Connective tissue disease (HCC)    ocular lens dislocation, flat feet, high palate, no cardiac involvement   Esophageal reflux    Eye problems 08/2011   subluxated lenses OU, rec test for homocyetinuria to r/o as cause (see scanned form)   Focal nodular hyperplasia of liver 11/10/2018   By MRI at [facility] 07/2018 - see scanned report   Hx of migraines    Irritable bowel syndrome    MDD (major depressive disorder), recurrent episode (HCC)     Pneumonia    Screening examination for pulmonary tuberculosis    Swelling, mass, or lump in head and neck    Uvulitis 10/02/2018    Past Surgical History: Past Surgical History:  Procedure Laterality Date   BREAST BIOPSY Right 2019   BREAST CYST ASPIRATION Right 2019   CHOLECYSTECTOMY N/A 03/16/2021   Procedure: LAPAROSCOPIC CHOLECYSTECTOMY WITH ICG DYE;  Surgeon: Ebbie Cough, MD;  Location: [facility];  Service: General;  Laterality: N/A;   ESOPHAGOGASTRODUODENOSCOPY  07/2018   gastric polyps biopsied, mild acute ?alcoholic gastritis (Dr Jennefer in [place])   FOOT SURGERY     Right-glass removed in OR   FRENULECTOMY, LINGUAL  2001   GANGLION CYST EXCISION     Left wrist   MYRINGOTOMY  multiple   had tissue from behind ears implanted into bilateral TMs to close (1990s)   REMOVE AND REPLACE LENS Left 2018   2 tension rings placed, lens replaced [facility]) [facility]    Current Medications: Current Outpatient Medications on File Prior to Visit  Medication Sig Dispense Refill   albuterol  (VENTOLIN  HFA) 108 (90 Base) MCG/ACT inhaler Inhale 1 puff into the lungs as directed. 6.7 g 1   [START ON 02/21/2024] amphetamine -dextroamphetamine  (ADDERALL  XR) 25 MG 24 hr capsule Take 1 capsule by mouth every morning. 30 capsule 0   ampicillin  (PRINCIPEN) 500 MG capsule Take 1 capsule (500 mg total) by mouth 2 (two) times daily. 60 capsule 6   clonazePAM  (KLONOPIN ) 0.5 MG  tablet Take 1 tablet (0.5 mg total) by mouth 2 (two) times daily. 60 tablet 2   clonazePAM  (KLONOPIN ) 0.5 MG tablet Take 1 tablet (0.5 mg total) by mouth 2 (two) times daily. 60 tablet 2   cyanocobalamin  (VITAMIN B12) 1000 MCG/ML injection Inject 1 mL (1,000 mcg total) into the muscle every 30 (thirty) days. 1 mL 11   DULoxetine  (CYMBALTA ) 60 MG capsule Take 1 capsule (60 mg total) by mouth daily. 90 capsule 1   lamoTRIgine  (LAMICTAL ) 100 MG tablet Take 1 tablet (100 mg total) by mouth daily. (Patient taking  differently: Take 100 mg by mouth daily. Takes 0.5 tablets daily) 30 tablet 5   lamoTRIgine  (LAMICTAL ) 25 MG tablet Take 2 tablets (50 mg total) by mouth daily. 180 tablet 1   meloxicam  (MOBIC ) 15 MG tablet Take 1 tablet by mouth daily for 2 weeks.  If still in pain after 2 weeks, take 1 tablet daily for an additional 1 week. 30 tablet 0   NEEDLE, DISP, 25 G 25G X 1 MISC Use as instructed to inject vitamin B 12 once a month 50 each 0   pantoprazole  (PROTONIX ) 40 MG tablet Take 1 tablet (40 mg total) by mouth daily. 90 tablet 3   QUEtiapine  (SEROQUEL ) 50 MG tablet Take 1-2 tablets (50-100 mg total) by mouth at bedtime. 180 tablet 1   SYRINGE-NEEDLE, DISP, 3 ML 25G X 5/8 3 ML MISC Use as instructed to inject vitamin B12 once a month 50 each 0   No current facility-administered medications on file prior to visit.    Allergies: Allergies  Allergen Reactions   Amoxicillin -Pot Clavulanate Other (See Comments)    Severe GI upset Diarrhea; per MD's office, tolerates amoxicillin  (clavulanic acid is the issue)   Neosporin [Neomycin -Bacitracin Zn-Polymyx] Rash   Abilify  [Aripiprazole ]     akathisia   Bupropion     Akathisia   Doxycycline  Other (See Comments)    abd pain   Vyvanse  [Lisdexamfetamine Dimesylate ] Other (See Comments)    headache   Ciprofloxacin  Other (See Comments)    Marfans syndrome cx    Family History:  Family history is negative for bleeding/clotting disorders, problems with anesthesia, or connective tissue disorders.   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  Tobacco Use   Smoking status: Former    Types: E-cigarettes    Quit date: 2025    Years since quitting: 0.7    Smokeless tobacco: Never  Vaping Use   Vaping status: Every Day  Substance and Sexual Activity   Alcohol use: Yes    Comment: occ wine   Drug use: No   Sexual activity: Yes    Birth control/protection: None  Other Topics Concern   Not on file  Social History Narrative   Single No childrenCaffeine: 1 coke Photographer at [employer] alone, 1 dog   Social Drivers of Corporate investment banker Strain: Not on file  Food Insecurity: Low Risk  (09/25/2023)   Received from [facility]   Hunger Vital Sign    Within the past 12 months, you worried that your food would run out before  you got money to buy more: Never true    Within the past 12 months, the food you bought just didn't last and you didn't have money to get more. : Never true  Transportation Needs: No Transportation Needs (09/25/2023)   Received from [facility]    In the past 12 months, has lack of reliable transportation kept you from medical appointments, meetings, work or from getting things needed for daily living? : No  Physical Activity: Not on file  Stress: Not on file  Social Connections: Not on file   Smoker Vapes Occupation: RN Recreational Drug use: Denies  Review of systems: 10 point review of systems performed and negative except as noted in the HPI  Physical Exam: BP 102/79 (BP Location: Left Arm, Patient Position: Sitting, Cuff Size: Large)   Pulse 97   Ht 5' 8 (1.727 m)   Wt 216 lb 9.6 oz (98.2 kg)   SpO2 96%   BMI 32.93 kg/m  Body mass index is 32.93 kg/m.  Physical Exam           General: Well appearing, no apparent distress. Pulm: Breathing comfortably on room air without sounds/wheezing. CV: Regular rate. Good perfusion of extremities. Chest: Chest wall without abnormality or obvious deformity. No scoliosis, pectus excavatum or pectus carinatum. Breast: Grade 3 ptotic breasts bilaterally. Excess skin of lateral chest extending from IMF with  secondary excess skin roll in axillary area. Measurements  Right Breast (cm)  Left Breast (cm)  SN-Nipple 33 34  Base Width 15 16  Nipple - IMF 12.5 13  NAC diameter 6*7 6*8  Internipple Distance 25   Abdomen: Soft, non-tender, no distension. No palpable hernia or bulge. Approximately 6 cm diastasis. Global lipodystrophy of abdomen with excess skin in both horizontal and vertical dimensions. Skin excess in infraumbilical region with infraumbilical fat deposits.Overhanging pannus. Redundant/ptotic mons tissue.  Excess skin extends circumferentially along posterior trunk. No current intertriginous rash. Well healed scars present.  Neuro: Moving all four extremities spontaneously. Psych: Appropriate mood and affect.  Labs: Recent Results (from the past 2160 hours)  Comprehensive metabolic panel with GFR     Status: None   Collection Time: 01/08/24  4:25 PM  Result Value Ref Range   Sodium 137 135 - 145 mEq/L   Potassium 4.1 3.5 - 5.1 mEq/L   Chloride 103 96 - 112 mEq/L   CO2 28 19 - 32 mEq/L   Glucose, Bld 83 70 - 99 mg/dL   BUN 9 6 - 23 mg/dL   Creatinine, Ser 9.28 0.40 - 1.20 mg/dL   Total Bilirubin 0.6 0.2 - 1.2 mg/dL   Alkaline Phosphatase 48 39 - 117 U/L   AST 15 0 - 37 U/L   ALT 18 0 - 35 U/L   Total Protein 6.9 6.0 - 8.3 g/dL   Albumin 4.4 3.5 - 5.2 g/dL   GFR 891.36 >39.99 mL/min    Comment: Calculated using the CKD-EPI Creatinine Equation (2021)   Calcium 9.5 8.4 - 10.5 mg/dL  Lipid panel     Status: Abnormal   Collection Time: 01/08/24  4:25 PM  Result Value Ref Range   Cholesterol 210 (H) 0 - 200 mg/dL    Comment: ATP III Classification       Desirable:  < 200 mg/dL               Borderline High:  200 - 239 mg/dL          High:  > =  240 mg/dL   Triglycerides 825.9 (H) 0.0 - 149.0 mg/dL    Comment: Normal:  <849 mg/dLBorderline High:  150 - 199 mg/dL   HDL 60.29 >60.99 mg/dL   VLDL 65.1 0.0 - 59.9 mg/dL   LDL Cholesterol 863 (H) 0 - 99 mg/dL   Total CHOL/HDL Ratio 5      Comment:                Men          Women1/2 Average Risk     3.4          3.3Average Risk          5.0          4.42X Average Risk          9.6          7.13X Average Risk          15.0          11.0                       NonHDL 170.41     Comment: NOTE:  Non-HDL goal should be 30 mg/dL higher than patient's LDL goal (i.e. LDL goal of < 70 mg/dL, would have non-HDL goal of < 100 mg/dL)  TSH     Status: None   Collection Time: 01/08/24  4:25 PM  Result Value Ref Range   TSH 0.98 0.35 - 5.50 uIU/mL  CBC with Differential/Platelet     Status: None   Collection Time: 01/08/24  4:25 PM  Result Value Ref Range   WBC 7.9 4.0 - 10.5 K/uL   RBC 4.81 3.87 - 5.11 Mil/uL   Hemoglobin 14.3 12.0 - 15.0 g/dL   HCT 56.9 63.9 - 53.9 %   MCV 89.4 78.0 - 100.0 fl   MCHC 33.2 30.0 - 36.0 g/dL   RDW 86.7 88.4 - 84.4 %   Platelets 295.0 150.0 - 400.0 K/uL   Neutrophils Relative % 68.0 43.0 - 77.0 %   Lymphocytes Relative 21.8 12.0 - 46.0 %   Monocytes Relative 6.3 3.0 - 12.0 %   Eosinophils Relative 3.1 0.0 - 5.0 %   Basophils Relative 0.8 0.0 - 3.0 %   Neutro Abs 5.4 1.4 - 7.7 K/uL   Lymphs Abs 1.7 0.7 - 4.0 K/uL   Monocytes Absolute 0.5 0.1 - 1.0 K/uL   Eosinophils Absolute 0.2 0.0 - 0.7 K/uL   Basophils Absolute 0.1 0.0 - 0.1 K/uL  Vitamin B12     Status: Abnormal   Collection Time: 01/08/24  4:25 PM  Result Value Ref Range   Vitamin B-12 157 (L) 211 - 911 pg/mL  VITAMIN D  25 Hydroxy (Vit-D Deficiency, Fractures)     Status: None   Collection Time: 01/08/24  4:25 PM  Result Value Ref Range   VITD 33.57 30.00 - 100.00 ng/mL  GeneConnect Molecular Screen - Blood ([facility])     Status: None   Collection Time: 01/30/24 11:22 AM  Result Value Ref Range   Genetic Analysis Overall Interpretation Negative    Genetic Disease Assessed      This is a screening test and does not detect all pathogenic or likely pathogenic variant(s) in the tested genes; diagnostic testing is  recommended for individuals with a personal or family history of heart disease or hereditary cancer. [facility] Tier One  Population Screen is a screening test that analyzes 11 genes related to  hereditary breast and ovarian cancer (HBOC) syndrome, Lynch syndrome, and familial hypercholesterolemia. This test only reports clinically significant pathogenic and likely  pathogenic variants but does not report variants of uncertain significance (VUS). In addition, analysis of the PMS2 gene excludes exons 11-15, which overlap with a known pseudogene (PMS2CL).    Genetic Analysis Report      No pathogenic or likely pathogenic variants were detected in the genes analyzed by this test.Genetic test results should be interpreted in the context of an individual's personal medical and family history. Alteration to medical management is NOT  recommended based solely on this result. Clinical correlation is advised.Additional Considerations- This is a screening test; individuals may still carry pathogenic or likely pathogenic variant(s) in the tested genes that are not detected by this test.-  For individuals at risk for these or other related conditions based on factors including personal or family history, diagnostic testing is recommended.- The absence of pathogenic or likely pathogenic variant(s) in the analyzed genes, while reassuring,  does not eliminate the possibility of a hereditary condition; there are other variants and genes associated with heart disease and hereditary cancer that are not included in this test.    Genes Tested See Notes     Comment: APOB, BRCA1, BRCA2, EPCAM, LDLR, LDLRAP1, PCSK9, PMS2, MLH1, MSH2, MSH6   Disclaimer See Notes     Comment: This test was developed and validated by [facility] [employer] This test has not been cleared or approved by the [place]  Food and Drug Administration (FDA). The [facility] laboratory is accredited by the College of American Pathologists (CAP) and certified under  the  Clinical Laboratory Improvement Amendments (CLIA #: [id number]) to perform high-complexity clinical tests. This test is used for clinical purposes. It should not be regarded as investigational use only or for research use only.    Sequencing Location See Notes     Comment: Sequencing done at [facility] [address], [address], [place] [address] (CLIA# [id number])   Interpretation Methods and Limitations See Notes     Comment: Extracted DNA is enriched for targeted regions and then sequenced using the [facility] Exome+ (R) assay on an Illumina DNA sequencing system. Data is then aligned to a modified version of GRCh38 and all genes are analyzed using the MANE transcript and MANE  Plus Clinical transcript, when available. Small variant calling is completed using a customized version of [employer]'s DNAseq software, augmented by a proprietary small variant caller for difficult variants. Copy number variants (CNVs) are then called  using a proprietary bioinformatics pipeline based on depth analysis with a comparison to similarly sequenced samples. Analysis of the PMS2 gene is limited to exons 1-10. The interpretation and reporting of variants in APOB, PCSK9, and LDLR is specific to  familial hypercholesterolemia; variants associated with hypobetalipoproteinemia are not included. Interpretation is based upon guidelines published by the [employer] and Gen[employer]), the Association for Mol ecular Pathology  (AMP) or their modification by [employer] when available and/or review of previous clinical assertions available in the [employer]. Interpretation is limited to the transcripts indicated on the report and +/- 10 bp into  intronic regions, except as noted below. [facility] variant classifications include pathogenic, likely pathogenic, variant of uncertain significance (VUS), likely benign, and benign. Only variants classified as pathogenic and  likely pathogenic are included in  the report. All reported variants are confirmed through secondary manual inspection of DNA sequence data or orthogonal testing. Risk  estimations and management guidelines included in this report are based on analysis of primary literature and  recommendations of applicable professional societies, and should be regarded as approximations.Based on validation studies,this assay delivers > 99% sensitivity and specificity for single nucleotide variants and insertions  and deletions (indels) up to 20  bp. Larger indels and complex variants are also reported but sensitivity may be reduced. Based on validation studies, this assay delivers > 99% sensitivity to multi-exon CNVs and > 90% sensitivity to single-exon CNVs. This test may not detect variants  in challenging regions (such as short tandem repeats, homopolymer runs, and segment duplications), sub-exonic CNVs, chromosomal aneuploidy, or variants in the presence of mosaicism. Phasing will be attempted and reported, when possible. Structural  rearrangements such as inversions, translocations, and gene conversions are not tested in this assay unless explicitly indicated. Additionally, deep intronic, promoter, and enhancer regions may not be covered. It is important to note that this is a  screening test and cannot detect all disease-causing variants. A negative result does not guarantee the absence of a rare, undetectable variant in the genes analyzed; consider using a diagnostic test if there is  significant personal and/or family history  of one of the conditions analyzed by this test. Any potential incidental findings outside of these genes and conditions will not be identified, nor reported. The results of a genetic test may be influenced by various factors, including bone marrow  transplantation, blood transfusions, or in rare cases, hematolymphoid neoplasms.Gene Specific Notes:APOB: analysis is limited to c.10580G>A and  c.10579C>T; BRCA1: sequencing analysis extends to CDS +/-20 bp; BRCA2: sequencing analysis extends to CDS  +/-20 bp. EPCAM: analysis is limited to CNVof exons 8-9; LDLR: analysis includes CNV ofthe promoter; MLH1: analysis includes CNV of the promoter; PMS2: analysis is limited to exons 1-10.Donnice JINNY Kemp, PhD, [email or web address]     Pertinent Imaging:  None  Assessment: This is a 38 y.o. female with history of massive weight loss and Marfans who presents for consultation for body contouring. The patient has maintained a 216 lb weight loss for over 6 months and has excess skin on their abdomen. Based on the patient's exam and discussion of patient's goals today, I believe the patient would be an appropriate candidate for panniculectomy +/- liposuction. Patient will like to defer breast reduction for now. We discussed the patient's goals and priorities at length. We reviewed that body contouring operations trade better contours at the expense of long, sometimes wide scars. We further discussed the operation(s) in detail including the incision and scar patterns, need for surgical drains and importance of postoperative compression. We reviewed that massive weight loss patients are at a higher risk for recurrent laxity and wound healing problems including dehiscence and infection. We further discussed the risks of bleeding, seroma, hematoma, asymmetries, standing cutaneous deformities or other contour deformities, asymmetry, skin/nipple/umbilical necrosis, fat necrosis/oil cysts, lymphedema, chronic wounds or sinus tracts, hypertrophic and keloid scarring, nerve injury (ie. LFCN, iliohypogastric, ilioinguinal nerves), numbness, paresthesia and sensory changes, intraabdominal injury. Revision procedures are not uncommon. We also discussed the risk of DVT/PE, fat embolism, heart attack, stroke, death as well as the risks of anesthesia. We reviewed the expected recovery period with no heavy activities  or lifting >5lbs for 6 weeks postoperatively. Timing of body contouring procedures should allow 6 months at a stable weight for patients to achieve metabolic and nutritional homeostasis. The patient's BMI today is Body mass index is 32.93 kg/m.SABRA We discussed that patients with BMI>30 are more likely  to suffer both minor and major complications following these procedures. The patient verbalized understanding of this concept and is willing to accept these increased risks. We also discussed the need to stop Vaping for 3-6 months. We also discussed that Marfan Syndrome is not an absolute contraindication for body countouring surgery, but it does require careful consideration and thorough evaluation before proceeding, as Marfan syndrome often have weaker or more fragile connective tissue, which can affect wound healing and increase the risk of complications such as poor scar formation, delayed healing, or tissue breakdown. There may also be associated cardiovascular risks (e.g., aortic aneurysm) that need to be evaluated before any surgery. The patient voiced understanding and wishes to proceed. All questions and concerns were addressed to the patient's apparent satisfaction.  Plan - We will plan for Cardiology and PCP clearance for panniculectomy +/- liposuction - Patient needs to stop Vaping for 3-6 months. Cotinine test will be ordered in 3 months on follow up.  - 3 hours under general anesthesia. - Scheduling pending clearance, Vaping cessation, negative cotinine test.  - Follow up with me in 3 month  The sensitive parts of the examination/procedure were performed with MA as chaperone.  The time documented represents the total time spent on the day of the encounter in preparing for and completing the visit. It does not include time spent by ancillary staff, a resident, a fellow, another trainee, or, for shared visits, time spent jointly with the patient or discussing the case or the performance of other  separately performed services.  Time spent: 45 minutes.     Karoline Fleer, MD [facility]  02/15/2024 12:48 PM

## 2024-02-21 ENCOUNTER — Telehealth: Payer: Self-pay

## 2024-02-21 NOTE — Telephone Encounter (Signed)
   Name: Heather Stewart  DOB: 20-Dec-1985  MRN: [id number]  Primary Cardiologist: Alm Clay, MD  Chart reviewed as part of pre-operative protocol coverage. Because of [name]'s past medical history and time since last visit, she will require a follow-up in-office visit in order to better assess preoperative cardiovascular risk.  Pre-op covering staff: - Please schedule appointment and call patient to inform them. If patient already had an upcoming appointment within acceptable timeframe, please add pre-op clearance to the appointment notes so provider is aware. - Please contact requesting surgeon's office via preferred method (i.e, phone, fax) to inform them of need for appointment prior to surgery.  Patient is not on anticoagulation or antiplatelet per review of medical record in Epic.    Lamarr Satterfield, NP  02/21/2024, 4:29 PM

## 2024-02-21 NOTE — Telephone Encounter (Signed)
 Patient stated she is thinking of doing procedure at the end of this year or the beginning of next year but has an appointment scheduled with the surgeon's office on Dec therefore she asked if clearance can be addressed at upcoming appointment on Novemeber with our office patient stated she needs to come in for an echo as well and another test along with her annual visit

## 2024-02-21 NOTE — Telephone Encounter (Signed)
   Pre-operative Risk Assessment    Patient Name: Heather Stewart  DOB: 08-May-1986 MRN: [id number]   Date of last office visit: 02/13/23 Date of next office visit: 04/16/24   Request for Surgical Clearance    Procedure:  Panniculectomy  Date of Surgery:  Clearance TBD                                Surgeon:  Dr. Montorfano Surgeon's Group or Practice Name[facility] Phone number:  [phone] Fax number:  [phone]   Type of Clearance Requested:   - Medical    Type of Anesthesia:  General    Additional requests/questions:    Bonney Ival LOISE Gerome   02/21/2024, 4:22 PM

## 2024-02-21 NOTE — Telephone Encounter (Signed)
 Faxed for cardiology clearance. Fax confirmation received: This message was sent via FAXCOM, a product from [employer]. http://www.biscom.com/                    -------Fax Transmission Report-------  To:               Recipient at [phone] Subject:          SECURED Result:           The transmission was successful. Explanation:      All Pages Ok Pages Sent:       3 Connect Time:     0 minutes, 56 seconds Transmit Time:    02/21/2024 15:58 Transfer Rate:    14400 Status Code:      0000 Retry Count:      0 Job Id:           [id number] Unique Id:        [id number] Fax Line:         28 Fax Server:       [id number]   Faxed [name] for PCP clearance;  Received fax confirmation

## 2024-02-22 ENCOUNTER — Telehealth: Payer: Self-pay

## 2024-02-22 NOTE — Telephone Encounter (Signed)
 Received form for pre op for patient's surgeon. Called patient date has not been set for Panniculectomy. She will call our office when date is set. Will likely be first of the year. Have placed form in pending folder at [facility] desk. Patient is aware we are holding form for her.

## 2024-02-22 NOTE — Telephone Encounter (Signed)
 Faxed PCP SX Clearance to [name].  Received fax confirmation: This message was sent via FAXCOM, a product from [employer]. http://www.biscom.com/                    -------Fax Transmission Report-------  To:               Recipient at [phone] Subject:          SECURED Result:           The transmission was successful. Explanation:      All Pages Ok Pages Sent:       3 Connect Time:     0 minutes, 59 seconds Transmit Time:    02/21/2024 16:00 Transfer Rate:    14400 Status Code:      0000 Retry Count:      0 Job Id:           [id number] Unique Id:        [id number] Fax Line:         28 Fax Server:       [employer]

## 2024-02-27 ENCOUNTER — Other Ambulatory Visit (HOSPITAL_COMMUNITY): Payer: Self-pay

## 2024-02-27 ENCOUNTER — Ambulatory Visit

## 2024-02-27 DIAGNOSIS — L509 Urticaria, unspecified: Secondary | ICD-10-CM | POA: Diagnosis not present

## 2024-02-27 MED ORDER — HYDROXYZINE HCL 25 MG PO TABS
25.0000 mg | ORAL_TABLET | Freq: Every day | ORAL | 1 refills | Status: AC
  Filled 2024-02-27: qty 30, 30d supply, fill #0
  Filled 2024-04-12: qty 30, 30d supply, fill #1

## 2024-03-04 ENCOUNTER — Other Ambulatory Visit (HOSPITAL_COMMUNITY): Payer: Self-pay

## 2024-03-04 DIAGNOSIS — L509 Urticaria, unspecified: Secondary | ICD-10-CM | POA: Diagnosis not present

## 2024-03-04 MED ORDER — PREDNISONE 10 MG PO TABS
ORAL_TABLET | ORAL | 0 refills | Status: DC
  Filled 2024-03-04: qty 30, 10d supply, fill #0

## 2024-03-06 ENCOUNTER — Encounter (HOSPITAL_BASED_OUTPATIENT_CLINIC_OR_DEPARTMENT_OTHER)

## 2024-03-07 ENCOUNTER — Encounter: Payer: Self-pay | Admitting: Adult Health

## 2024-03-07 ENCOUNTER — Ambulatory Visit: Admitting: Adult Health

## 2024-03-07 ENCOUNTER — Other Ambulatory Visit (HOSPITAL_COMMUNITY): Payer: Self-pay

## 2024-03-07 ENCOUNTER — Other Ambulatory Visit: Payer: Self-pay

## 2024-03-07 VITALS — BP 122/77 | HR 74 | Temp 98.2°F | Ht 68.5 in | Wt 216.4 lb

## 2024-03-07 DIAGNOSIS — Z6832 Body mass index (BMI) 32.0-32.9, adult: Secondary | ICD-10-CM | POA: Diagnosis not present

## 2024-03-07 DIAGNOSIS — G4733 Obstructive sleep apnea (adult) (pediatric): Secondary | ICD-10-CM | POA: Diagnosis not present

## 2024-03-07 NOTE — Progress Notes (Signed)
 @Patient  ID: Heather Stewart, female    DOB: 1985-10-20, 38 y.o.   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  She has mild obstructive sleep apnea but has been CPAP intolerant.  She also used an oral appliance in the past but could not tolerate.  Patient says she has been aggressively working on weight loss got down over 30 pounds while taking Zepbound  but unfortunately insurance stopped covering and is gained a little bit of the weight back.  Patient says her family complains that she continues to snore.  Patient was set up for a repeat sleep study that was completed on January 19, 2024 that showed minimum sleep apnea with AHI at 5.3/hour and SpO2 low at 88%.  We discussed her sleep study results in detail.  Patient says she does not want to restart CPAP or oral appliance.  We discussed ongoing weight loss along with positional sleep. Patient says she does have a difficult time with some daytime fatigue low energy and decreased stamina.    Allergies  Allergen Reactions   Amoxicillin -Pot Clavulanate Other (See Comments)    Severe GI upset Diarrhea; per MD's office, tolerates amoxicillin  (clavulanic acid is the issue)   Neosporin [Neomycin -Bacitracin Zn-Polymyx] Rash   Abilify  [Aripiprazole ]     akathisia   Bupropion     Akathisia   Doxycycline  Other (See Comments)    abd pain   Vyvanse  [Lisdexamfetamine Dimesylate ] Other (See Comments)    headache   Ciprofloxacin  Other (See Comments)    Marfans syndrome cx    Immunization History  Administered Date(s) Administered    Influenza Whole 02/28/2012   Influenza,inj,Quad PF,6+ Mos 03/12/2019, 03/11/2022   Influenza-Unspecified 03/23/2017   MMR 04/15/2016   Moderna Sars-Covid-2 Vaccination 06/21/2019, 07/18/2019   PPD Test 07/07/2017   Td 11/27/2004   Tdap 05/31/2012, 01/08/2024    Past Medical History:  Diagnosis Date   Acne    ADHD (attention deficit hyperactivity disorder), combined type    dx as child and then received through psychiatry - reviewed records from psych: no recent w/u.  was on vyvanse  during nursing school   Adjustment disorder    Anxiety    Chronic back pain    thoracic and lumbar - s/p unrevealing NSG eval    Connective tissue disease    ocular lens dislocation, flat feet, high palate, no cardiac involvement   Esophageal reflux    Eye problems 08/2011   subluxated lenses OU, rec test for homocyetinuria to r/o as cause (see scanned form)   Focal nodular hyperplasia of liver 11/10/2018   By MRI at [facility] 07/2018 - see scanned report   Hx of migraines    Irritable bowel syndrome    MDD (major depressive disorder), recurrent episode    Pneumonia    Screening examination for pulmonary tuberculosis    Swelling, mass, or lump in head and neck    Uvulitis 10/02/2018    Tobacco History: Social History   Tobacco Use  Smoking Status Former   Types: E-cigarettes   Quit  date: 2025   Years since quitting: 0.7  Smokeless Tobacco Current  Tobacco Comments   VAPING SINCE 2022   Ready to quit: Not Answered Counseling given: Not Answered Tobacco comments: VAPING SINCE 2022   Outpatient Medications Prior to Visit  Medication Sig Dispense Refill   albuterol  (VENTOLIN  HFA) 108 (90 Base) MCG/ACT inhaler Inhale 1 puff into the lungs as directed. 6.7 g 1   amphetamine -dextroamphetamine  (ADDERALL  XR) 25 MG 24 hr capsule Take 1 capsule by mouth every morning. 30 capsule 0   ampicillin  (PRINCIPEN) 500 MG capsule Take 1 capsule (500 mg total) by mouth 2 (two) times daily.  60 capsule 6   clonazePAM  (KLONOPIN ) 0.5 MG tablet Take 1 tablet (0.5 mg total) by mouth 2 (two) times daily. 60 tablet 2   cyanocobalamin  (VITAMIN B12) 1000 MCG/ML injection Inject 1 mL (1,000 mcg total) into the muscle every 30 (thirty) days. 1 mL 11   DULoxetine  (CYMBALTA ) 60 MG capsule Take 1 capsule (60 mg total) by mouth daily. 90 capsule 1   lamoTRIgine  (LAMICTAL ) 25 MG tablet Take 2 tablets (50 mg total) by mouth daily. 180 tablet 1   NEEDLE, DISP, 25 G 25G X 1 MISC Use as instructed to inject vitamin B 12 once a month 50 each 0   predniSONE  (DELTASONE ) 10 MG tablet 5 tablets (50 mg total) daily for 2 days, THEN 4 tablets (40 mg total) daily for 2 days, THEN 3 tablets (30 mg total) daily for 2 days, THEN 2 tablets (20 mg total) daily for 2 days, THEN 1 tablet (10 mg total) daily for 2 days. 30 tablet 0   QUEtiapine  (SEROQUEL ) 50 MG tablet Take 1-2 tablets (50-100 mg total) by mouth at bedtime. 180 tablet 1   SYRINGE-NEEDLE, DISP, 3 ML 25G X 5/8 3 ML MISC Use as instructed to inject vitamin B12 once a month 50 each 0   clonazePAM  (KLONOPIN ) 0.5 MG tablet Take 1 tablet (0.5 mg total) by mouth 2 (two) times daily. (Patient not taking: Reported on 03/07/2024) 60 tablet 2   hydrOXYzine (ATARAX) 25 MG tablet Take 1 tablet (25 mg total) by mouth every night. (Patient not taking: Reported on 03/07/2024) 30 tablet 1   lamoTRIgine  (LAMICTAL ) 100 MG tablet Take 1 tablet (100 mg total) by mouth daily. (Patient not taking: Reported on 03/07/2024) 30 tablet 5   meloxicam  (MOBIC ) 15 MG tablet Take 1 tablet by mouth daily for 2 weeks.  If still in pain after 2 weeks, take 1 tablet daily for an additional 1 week. (Patient not taking: Reported on 03/07/2024) 30 tablet 0   pantoprazole  (PROTONIX ) 40 MG tablet Take 1 tablet (40 mg total) by mouth daily. (Patient not taking: Reported on 03/07/2024) 90 tablet 3   No facility-administered medications prior to visit.     Review of Systems:   Constitutional:   No   weight loss, night sweats,  Fevers, chills, +fatigue, or  lassitude.  HEENT:   No headaches,  Difficulty swallowing,  Tooth/dental problems, or  Sore throat,                No sneezing, itching, ear ache, nasal congestion, post nasal drip,   CV:  No chest pain,  Orthopnea, PND, swelling in lower extremities, anasarca, dizziness, palpitations, syncope.   GI  No heartburn, indigestion, abdominal pain, nausea, vomiting, diarrhea, change in bowel habits, loss of appetite, bloody stools.   Resp: No shortness of breath with exertion or at rest.  No excess mucus,  no productive cough,  No non-productive cough,  No coughing up of blood.  No change in color of mucus.  No wheezing.  No chest wall deformity  Skin: no rash or lesions.  GU: no dysuria, change in color of urine, no urgency or frequency.  No flank pain, no hematuria   MS:  No joint pain or swelling.  No decreased range of motion.  No back pain.    Physical Exam  BP 122/77   Pulse 74   Temp 98.2 F (36.8 C)   Ht 5' 8.5 (1.74 m)   Wt 216 lb 6.4 oz (98.2 kg)   SpO2 98% Comment: ra  BMI 32.42 kg/m   GEN: A/Ox3; pleasant , NAD, well nourished    HEENT:  [place]/AT,  , NOSE-clear, THROAT-clear, no lesions, no postnasal drip or exudate noted.  Class II MP airway  NECK:  Supple w/ fair ROM; no JVD; normal carotid impulses w/o bruits; no thyromegaly or nodules palpated; no lymphadenopathy.    RESP  Clear  P & A; w/o, wheezes/ rales/ or rhonchi. no accessory muscle use, no dullness to percussion  CARD:  RRR, no m/r/g, no peripheral edema, pulses intact, no cyanosis or clubbing.  GI:   Soft & nt; nml bowel sounds; no organomegaly or masses detected.   Musco: Warm bil, no deformities or joint swelling noted.   Neuro: alert, no focal deficits noted.    Skin: Warm, no lesions or rashes    Lab Results:  CBC    Component Value Date/Time   WBC 7.9 01/08/2024 1625   RBC 4.81 01/08/2024 1625   HGB 14.3 01/08/2024 1625   HCT 43.0  01/08/2024 1625   PLT 295.0 01/08/2024 1625   MCV 89.4 01/08/2024 1625   MCH 30.0 06/09/2023 2301   MCHC 33.2 01/08/2024 1625   RDW 13.2 01/08/2024 1625   LYMPHSABS 1.7 01/08/2024 1625   MONOABS 0.5 01/08/2024 1625   EOSABS 0.2 01/08/2024 1625   BASOSABS 0.1 01/08/2024 1625    BMET    Component Value Date/Time   NA 137 01/08/2024 1625   NA 136 03/29/2022 0901   K 4.1 01/08/2024 1625   CL 103 01/08/2024 1625   CO2 28 01/08/2024 1625   GLUCOSE 83 01/08/2024 1625   BUN 9 01/08/2024 1625   BUN 15 03/29/2022 0901   CREATININE 0.71 01/08/2024 1625   CALCIUM 9.5 01/08/2024 1625   GFRNONAA >60 06/09/2023 2301    BNP    Component Value Date/Time   BNP 18.8 07/10/2014 1725    ProBNP No results found for: PROBNP  Imaging: No results found.  cyanocobalamin  (VITAMIN B12) injection 1,000 mcg     Date Action Dose Route User   01/11/2024 1150 Given 1,000 mcg Intramuscular (Right Deltoid) Tennie, Rena M, LPN           No data to display          No results found for: NITRICOXIDE      Assessment & Plan:   Assessment and Plan Assessment & Plan   Problem List Items Addressed This Visit       Respiratory   OSA (obstructive sleep apnea) - Primary   Other Visit Diagnoses       Morbid obesity (HCC)          Mild obstructive sleep apnea patient is CPAP intolerant and oral appliance intolerant.  Have encouraged her on positional sleep with keeping her head of her bed elevated at 30 degrees, avoiding  sleeping on her back.  Also encouraged her on ongoing weight loss.  Obesity -Current weight is at 216 pounds with a BMI 32.  Encouraged her on healthy weight loss.  Previously successful with Zepbound .  Unfortunately insurance does not cover.   Plan  Patient Instructions  Elevate Head of bed 30 degrees.  Work on healthy weight loss  Avoid sleep on your back.  Follow up As needed        Madelin Stank, NP 03/07/2024

## 2024-03-07 NOTE — Addendum Note (Signed)
 Addended by: EUSTACIO POUR on: 03/07/2024 01:57 PM   Modules accepted: Orders

## 2024-03-07 NOTE — Patient Instructions (Signed)
 Elevate Head of bed 30 degrees.  Work on healthy weight loss  Avoid sleep on your back.  Follow up As needed

## 2024-03-11 ENCOUNTER — Ambulatory Visit
Admission: RE | Admit: 2024-03-11 | Discharge: 2024-03-11 | Disposition: A | Source: Ambulatory Visit | Attending: Obstetrics & Gynecology | Admitting: Obstetrics & Gynecology

## 2024-03-11 ENCOUNTER — Other Ambulatory Visit (HOSPITAL_COMMUNITY): Payer: Self-pay

## 2024-03-11 ENCOUNTER — Encounter (HOSPITAL_COMMUNITY): Payer: Self-pay

## 2024-03-11 DIAGNOSIS — Z1231 Encounter for screening mammogram for malignant neoplasm of breast: Secondary | ICD-10-CM | POA: Diagnosis not present

## 2024-03-11 MED ORDER — PREDNISONE 10 MG PO TABS
ORAL_TABLET | ORAL | 0 refills | Status: AC
  Filled 2024-03-11 (×2): qty 31, 11d supply, fill #0
  Filled ????-??-??: fill #0

## 2024-03-12 ENCOUNTER — Other Ambulatory Visit (HOSPITAL_COMMUNITY): Payer: Self-pay

## 2024-03-27 ENCOUNTER — Other Ambulatory Visit (HOSPITAL_COMMUNITY): Payer: Self-pay

## 2024-03-29 ENCOUNTER — Encounter: Payer: Self-pay | Admitting: Internal Medicine

## 2024-03-29 ENCOUNTER — Ambulatory Visit: Admitting: Internal Medicine

## 2024-03-29 ENCOUNTER — Other Ambulatory Visit: Payer: Self-pay

## 2024-03-29 VITALS — BP 118/78 | HR 103 | Temp 99.1°F | Ht 68.5 in | Wt 218.8 lb

## 2024-03-29 DIAGNOSIS — J3089 Other allergic rhinitis: Secondary | ICD-10-CM

## 2024-03-29 DIAGNOSIS — L501 Idiopathic urticaria: Secondary | ICD-10-CM | POA: Diagnosis not present

## 2024-03-29 DIAGNOSIS — Q8740 Marfan's syndrome, unspecified: Secondary | ICD-10-CM

## 2024-03-29 DIAGNOSIS — H1045 Other chronic allergic conjunctivitis: Secondary | ICD-10-CM

## 2024-03-29 DIAGNOSIS — T63481D Toxic effect of venom of other arthropod, accidental (unintentional), subsequent encounter: Secondary | ICD-10-CM

## 2024-03-29 DIAGNOSIS — S70261A Insect bite (nonvenomous), right hip, initial encounter: Secondary | ICD-10-CM

## 2024-03-29 DIAGNOSIS — Z883 Allergy status to other anti-infective agents status: Secondary | ICD-10-CM | POA: Diagnosis not present

## 2024-03-29 DIAGNOSIS — T63481A Toxic effect of venom of other arthropod, accidental (unintentional), initial encounter: Secondary | ICD-10-CM

## 2024-03-29 NOTE — Progress Notes (Signed)
 NEW PATIENT Date of Service/Encounter:  03/29/24 Referring provider: Rilla Baller, MD Primary care provider: Rilla Baller, MD  Subjective:  Heather Stewart is a 38 y.o. female  presenting today for evaluation of urticaria  History obtained from: chart review and patient.   Discussed the use of AI scribe software for clinical note transcription with the patient, who gave verbal consent to proceed.  History of Present Illness Heather Stewart is a 38 year old female with Marfan's syndrome who presents with chronic hives following COVID-19 infection.  Urticaria and pruritus - Chronic hives developed approximately two weeks after recovering from COVID-19 infection contracted in late August 2025 - Initial onset of hives followed scratching a bite on the hip  -pictures of bite site showed raised patch with central brusing and erythematous edges,  - Significant worsening of hives after receiving a flu shot in late September 2025 - Two courses of oral corticosteroids and one steroid injection provided only temporary relief; hives recurred after each treatment - Current regimen includes Zyrtec twice daily, Pepcid 40 mg once daily, and hydroxyzine at night for pruritus and sleep  -has been on regimen for at least 4 weeks  - Persistent symptoms include significant pruritus, discomfort, easy bruising, and sensation of being flushed - Heat exacerbates pruritus; alcohol consumption triggers flushing - No angioedema of the mouth or throat, but diffuse erythema occurred during a meal which she did consume mammalian meat   Allergic rhinitis and seasonal allergies - History of seasonal allergic symptoms including rhinorrhea, nasal congestion, watery eyes, and sneezing - Alternates between Allegra and Xyzal depending on season - Uses Flonase  and ocular antihistamine drops as needed  Hypersensitivity reactions - Significant localized swelling after bee stings - Allergy to neomycin , which  causes hives; suspects neomycin  may be present in the flu vaccine received,  will get us  exact manufacturer to verify excipients   Connective tissue disorder manifestations - History of Marfan's syndrome - Easy bruising attributed to underlying connective tissue disorder  Also has a history of PMLE, has seen derm, records unavailable.    Other allergy screening: Asthma: no Rhino conjunctivitis: yes Food allergy: no Medication allergy: yes:  neomycin  causes hives  Hymenoptera allergy: yes Urticaria: yes Eczema:no History of recurrent infections suggestive of immunodeficency: no Vaccinations are up to date.   Past Medical History: Past Medical History:  Diagnosis Date   Acne    ADHD (attention deficit hyperactivity disorder), combined type    dx as child and then received through psychiatry - reviewed records from psych: no recent w/u.  was on vyvanse  during nursing school   Adjustment disorder    Anxiety    Chronic back pain    thoracic and lumbar - s/p unrevealing NSG eval    Connective tissue disease    ocular lens dislocation, flat feet, high palate, no cardiac involvement   Esophageal reflux    Eye problems 08/2011   subluxated lenses OU, rec test for homocyetinuria to r/o as cause (see scanned form)   Focal nodular hyperplasia of liver 11/10/2018   By MRI at [facility] 07/2018 - see scanned report   Hx of migraines    Irritable bowel syndrome    MDD (major depressive disorder), recurrent episode    Pneumonia    Recurrent upper respiratory infection (URI)    Screening examination for pulmonary tuberculosis    Swelling, mass, or lump in head and neck    Uvulitis 10/02/2018   Medication List:  Current Outpatient  Medications  Medication Sig Dispense Refill   amphetamine -dextroamphetamine  (ADDERALL  XR) 25 MG 24 hr capsule Take 1 capsule by mouth every morning. 30 capsule 0   ampicillin  (PRINCIPEN) 500 MG capsule Take 1 capsule (500 mg total) by mouth 2  (two) times daily. 60 capsule 6   clonazePAM  (KLONOPIN ) 0.5 MG tablet Take 1 tablet (0.5 mg total) by mouth 2 (two) times daily. 60 tablet 2   clonazePAM  (KLONOPIN ) 0.5 MG tablet Take 1 tablet (0.5 mg total) by mouth 2 (two) times daily. 60 tablet 2   cyanocobalamin  (VITAMIN B12) 1000 MCG/ML injection Inject 1 mL (1,000 mcg total) into the muscle every 30 (thirty) days. 1 mL 11   DULoxetine  (CYMBALTA ) 60 MG capsule Take 1 capsule (60 mg total) by mouth daily. 90 capsule 1   hydrOXYzine (ATARAX) 25 MG tablet Take 1 tablet (25 mg total) by mouth every night. 30 tablet 1   lamoTRIgine  (LAMICTAL ) 25 MG tablet Take 2 tablets (50 mg total) by mouth daily. 180 tablet 1   NEEDLE, DISP, 25 G 25G X 1 MISC Use as instructed to inject vitamin B 12 once a month 50 each 0   QUEtiapine  (SEROQUEL ) 50 MG tablet Take 1-2 tablets (50-100 mg total) by mouth at bedtime. 180 tablet 1   SYRINGE-NEEDLE, DISP, 3 ML 25G X 5/8 3 ML MISC Use as instructed to inject vitamin B12 once a month 50 each 0   albuterol  (VENTOLIN  HFA) 108 (90 Base) MCG/ACT inhaler Inhale 1 puff into the lungs as directed. (Patient not taking: Reported on 03/29/2024) 6.7 g 1   pantoprazole  (PROTONIX ) 40 MG tablet Take 1 tablet (40 mg total) by mouth daily. (Patient not taking: Reported on 03/29/2024) 90 tablet 3   No current facility-administered medications for this visit.   Known Allergies:  Allergies  Allergen Reactions   Amoxicillin -Pot Clavulanate Other (See Comments)    Severe GI upset Diarrhea; per MD's office, tolerates amoxicillin  (clavulanic acid is the issue)   Neosporin [Neomycin -Bacitracin Zn-Polymyx] Rash   Abilify  [Aripiprazole ]     akathisia   Bupropion     Akathisia   Doxycycline  Other (See Comments)    abd pain   Vyvanse  [Lisdexamfetamine Dimesylate ] Other (See Comments)    headache   Ciprofloxacin  Other (See Comments)    Marfans syndrome cx   Past Surgical History: Past Surgical History:  Procedure Laterality Date    BREAST BIOPSY Right 2019   BREAST CYST ASPIRATION Right 2019   CHOLECYSTECTOMY N/A 03/16/2021   Procedure: LAPAROSCOPIC CHOLECYSTECTOMY WITH ICG DYE;  Surgeon: Ebbie Cough, MD;  Location: [facility];  Service: General;  Laterality: N/A;   ESOPHAGOGASTRODUODENOSCOPY  07/2018   gastric polyps biopsied, mild acute ?alcoholic gastritis (Dr Jennefer in [place])   FOOT SURGERY     Right-glass removed in OR   FRENULECTOMY, LINGUAL  2001   GANGLION CYST EXCISION     Left wrist   MYRINGOTOMY  multiple   had tissue from behind ears implanted into bilateral TMs to close (1990s)   REMOVE AND REPLACE LENS Left 2018   2 tension rings placed, lens replaced [facility]) [facility]   SINOSCOPY     TYMPANOSTOMY TUBE PLACEMENT     Family History: Family History  Problem Relation Age of Onset   Migraines Mother    Alcohol abuse Father    Other Father        Spinal problem   Cancer Brother        HALF brother (had kidney  removed at 10 months-then died of cancer)   Liver cancer Brother    Breast cancer Maternal Aunt        x2   Breast cancer Maternal Grandmother    COPD Paternal Grandfather    Coronary artery disease Other        GF 5v bypass   Breast cancer Other        Aunts x3   Heart disease Other        MGF, PGF, GGM   Kidney disease Other        GGM   Social History: [employer] [facility] single-family home is 44.37 years old.  No water damage.  LVP throughout.  Electric heating central cooling.  Hypoallergenic dogs with access to bedroom, chickens outside the home.  No roaches in the house and best to be off floor.  Dust mite precautions in bed and pillows.  Works as an ER RN.  Vape exposure inside the house and car.  She is an active vapor since 2023.   ROS:  All other systems negative except as noted per HPI.  Objective:  Blood pressure 118/78, pulse (!) 103, temperature 99.1 F (37.3 C), height 5' 8.5 (1.74 m), weight 218 lb 12.8 oz (99.2 kg), SpO2 98%. Body mass index is 32.78  kg/m. Physical Exam:  General Appearance:  Alert, cooperative, no distress, appears stated age  Head:  Normocephalic, without obvious abnormality, atraumatic  Eyes:  Conjunctiva clear, EOM's intact  Ears EACs normal bilaterally and normal TMs bilaterally  Nose: Nares normal, erythematous mucosa, no visible anterior polyps, and septum midline  Throat: Lips, tongue normal; teeth and gums normal, normal posterior oropharynx  Neck: Supple, symmetrical  Lungs:   clear to auscultation bilaterally, Respirations unlabored, no coughing  Heart:  regular rate and rhythm and no murmur, Appears well perfused  Extremities: No edema  Skin: Erythematous papules on back, scattered petechiae from scratching  and Skin color, texture, turgor normal  Neurologic: No gross deficits   Diagnostics: None done    Labs:  Lab Orders         Tickborne Disease Antibody Profile, Serum         Alpha-Gal Panel         IgE         Tryptase       Assessment and Plan  Assessment and Plan Assessment & Plan Chronic Spontaneous Urticaria Suspected post-COVID-19 and potential tick bite. High-dose antihistamines have limited efficacy. Xolair recommended for chronic hives, with a 0.01% risk of delayed allergic reaction. Treatment is indefinite, tapering possible after 6-12 months of control. - Initiate Xolair injections every four weeks,  - informed consent obtained today, epipen  and allergy action plan prescribed  - Will start via Sample after allergy test appointment . - Schedule allergy testing for environmental and select food allergens before starting Xolair. - Prescribe EpiPen  and provide written allergy action plan. - Increase cetirizine to four pills daily or switch to two fexofenadine in the morning and two cetirizine to reduce dryness. -Continue Pepcid 40 mg daily -Can use hydroxyzine 25 mg as needed for breakthrough symptoms - Advise against scratching to prevent histamine release and bruising. - Apply  menthol-based cream for symptomatic relief. - Will get hive labs: IgE, tryptase, chronic urticaria panel  Alpha-gal Syndrome Concern due to tick bite history and reaction to flu shot. Symptoms include hives and flushing post-beef consumption. Testing for alpha-gal is warranted. - Order labs to test for alpha-gal syndrome. - Avoid red meat (  pork, beef, venison) until lab results are available.  Seasonal Allergies Symptoms include rhinorrhea, nasal congestion, epiphora, and sneezing. Current management includes oral antihistamines and fluticasone . - Continue current management with daily oral antihistamines  - continue fluticasone  1 spray per each nostril twice daily as needed for nasal congestion.  Aim upward and outward - Continue Pataday  once daily as needed eye symptoms.  Bee Sting Allergy Large local reactions with significant swelling. No systemic reactions. Family history of severe bee sting allergy. - You had a large local reaction to insect sting/bite based on history  - your risk of a severe systemic reaction remains low but is slightly increased from the general population and we do not need to do anything different at this time - For staying start cetirizine 1-2 tabs twice daily for at least a week   Marfan Syndrome Contributes to easy bruising and potential complications with hives from scratching Will avoid Rhapsido for now, given risk of mucosal bleeding   Follow-up - Schedule follow-up appointment for allergy testing ([phone]) and first Xolair injection on a day off work.    This note in its entirety was forwarded to the Provider who requested this consultation.  Other:    Thank you for your kind referral. I appreciate the opportunity to take part in Heather Stewart's care. Please do not hesitate to contact me with questions.  Sincerely,  Thank you so much for letting me partake in your care today.  Don't hesitate to reach out if you have any additional concerns!  Hargis Springer, MD  [facility] [place], [place]

## 2024-03-29 NOTE — Patient Instructions (Signed)
 Chronic Spontaneous Urticaria Suspected post-COVID-19 and potential tick bite. High-dose antihistamines have limited efficacy. Xolair recommended for chronic hives, with a 0.01% risk of delayed allergic reaction. Treatment is indefinite, tapering possible after 6-12 months of control. - Initiate Xolair injections every four weeks,  - informed consent obtained today, epipen  and allergy action plan prescribed  - Will start via Sample after allergy test appointment . - Schedule allergy testing for environmental and select food allergens before starting Xolair. - Prescribe EpiPen  and provide written allergy action plan. - Increase cetirizine to four pills daily or switch to two fexofenadine in the morning and two cetirizine to reduce dryness. -Continue Pepcid 40 mg daily -Can use hydroxyzine 25 mg as needed for breakthrough symptoms - Advise against scratching to prevent histamine release and bruising. - Apply menthol-based cream for symptomatic relief. - Will get hive labs: IgE, tryptase, chronic urticaria panel  Alpha-gal Syndrome Concern due to tick bite history and reaction to flu shot. Symptoms include hives and flushing post-beef consumption. Testing for alpha-gal is warranted. - Order labs to test for alpha-gal syndrome. - Avoid red meat (pork, beef, venison) until lab results are available.  Seasonal Allergies Symptoms include rhinorrhea, nasal congestion, epiphora, and sneezing. Current management includes oral antihistamines and fluticasone . - Continue current management with daily oral antihistamines  - continue fluticasone  1 spray per each nostril twice daily as needed for nasal congestion.  Aim upward and outward - Continue Pataday  once daily as needed eye symptoms.  Bee Sting Allergy Large local reactions with significant swelling. No systemic reactions. Family history of severe bee sting allergy. - You had a large local reaction to insect sting/bite based on history  - your  risk of a severe systemic reaction remains low but is slightly increased from the general population and we do not need to do anything different at this time - For staying start cetirizine 1-2 tabs twice daily for at least a week   Marfan Syndrome Contributes to easy bruising and potential complications with hives from scratching Will avoid Rhapsido for now, given risk of mucosal bleeding   Follow-up - Schedule follow-up appointment for allergy testing (1-68) and first Xolair injection on a day off work.

## 2024-04-02 LAB — TRYPTASE: Tryptase: 4.7 ug/L (ref 2.2–13.2)

## 2024-04-02 LAB — TICKBORNE DISEASE ANTIBODY PROFILE, SERUM
Babesia microti IgG: <1:10 {titer}
E.Chaffeensis (HME) IgG: NEGATIVE
HGE IgG Titer: NEGATIVE

## 2024-04-02 LAB — ALPHA-GAL PANEL
Allergen Lamb IgE: <0.1 kU/L
Beef IgE: <0.1 kU/L
IgE (Immunoglobulin E), Serum: 12 [IU]/mL (ref 6–495)
O215-IgE Alpha-Gal: <0.1 kU/L
Pork IgE: <0.1 kU/L

## 2024-04-04 ENCOUNTER — Telehealth: Payer: Self-pay | Admitting: Internal Medicine

## 2024-04-04 NOTE — Telephone Encounter (Signed)
 Heather Stewart called and stated that she needs to know if and when she will be able to begin her Xolair. Best contact [phone].

## 2024-04-05 ENCOUNTER — Ambulatory Visit: Payer: Self-pay | Admitting: Internal Medicine

## 2024-04-05 ENCOUNTER — Other Ambulatory Visit: Payer: Self-pay | Admitting: Family

## 2024-04-05 DIAGNOSIS — Z1589 Genetic susceptibility to other disease: Secondary | ICD-10-CM

## 2024-04-05 DIAGNOSIS — Z1151 Encounter for screening for human papillomavirus (HPV): Secondary | ICD-10-CM | POA: Diagnosis not present

## 2024-04-05 DIAGNOSIS — Z01419 Encounter for gynecological examination (general) (routine) without abnormal findings: Secondary | ICD-10-CM | POA: Diagnosis not present

## 2024-04-05 DIAGNOSIS — Z6831 Body mass index (BMI) 31.0-31.9, adult: Secondary | ICD-10-CM | POA: Diagnosis not present

## 2024-04-05 DIAGNOSIS — Z124 Encounter for screening for malignant neoplasm of cervix: Secondary | ICD-10-CM | POA: Diagnosis not present

## 2024-04-05 NOTE — Progress Notes (Signed)
 Blood work was negative for tick borne diseases including alpha gal allergy.   Can someone let patient know?

## 2024-04-08 NOTE — Telephone Encounter (Signed)
 Heather Stewart- she is scheduled for allergy testing on the 14th and per Dr Lorin we will start via Sample of Xolair after allergy test appointment. She has already signed consent. Just making sure you knew.   Responded to pt via Mychart.

## 2024-04-12 ENCOUNTER — Other Ambulatory Visit: Payer: Self-pay

## 2024-04-12 ENCOUNTER — Ambulatory Visit: Admitting: Internal Medicine

## 2024-04-12 DIAGNOSIS — L501 Idiopathic urticaria: Secondary | ICD-10-CM

## 2024-04-12 MED ORDER — NEFFY 2 MG/0.1ML NA SOLN: 1.0000 | Freq: Once | NASAL | 1 refills | Status: DC | PRN

## 2024-04-12 MED ORDER — OMALIZUMAB 300 MG/2  ML ~~LOC~~ SOSY
300.0000 mg | PREFILLED_SYRINGE | Freq: Once | SUBCUTANEOUS | Status: AC
  Administered 2024-04-12: 300 mg via SUBCUTANEOUS

## 2024-04-12 NOTE — Progress Notes (Signed)
 Immunotherapy   Patient Details  Name: Heather Stewart MRN: [id number] Date of Birth: 25-Feb-1986  04/12/2024  Heather Stewart started injections for  Xolair 300 mg/ 2 mL  Frequency: Every 28 days  Epi-Pen: Prescription for Neffy  given.  Consent signed and patient instructions given. Waited 1 hour with no problems.  NDC 49757-772-13 Lot [id number]  Expires 04/2025   Heather Stewart 04/12/2024, 3:08 PM

## 2024-04-12 NOTE — Progress Notes (Signed)
 Date of Service/Encounter:  04/12/24  Allergy testing appointment   Initial visit on 03/29/2024, seen for urticaria.  Please see that note for additional details.  Today reports for allergy diagnostic testing:    DIAGNOSTICS:  Skin Testing: Environmental allergy panel and select foods. Adequate positive and negative controls Results discussed with patient/family.  Airborne Adult Perc - 04/12/24 1439     Time Antigen Placed 1439    Allergen Manufacturer Jestine    Location Back    Number of Test 55    1. Control-Buffer 50% Glycerol Negative    2. Control-Histamine 2+    3. Bahia Negative    4. Bermuda Negative    5. Johnson Negative    6. [place]  Blue Negative    7. Meadow Fescue Negative    8. Perennial Rye Negative    9. Timothy Negative    10. Ragweed Mix Negative    11. Cocklebur Negative    12. Plantain,  English Negative    13. Baccharis Negative    14. Dog Fennel Negative    15. Russian Thistle Negative    16. Lamb's Quarters Negative    17. Sheep Sorrell Negative    18. Rough Pigweed Negative    19. Marsh Elder, Rough Negative    20. Mugwort, Common Negative    21. Box, Elder Negative    22. Cedar, red Negative    23. Sweet Gum Negative    24. Pecan Pollen Negative    25. Pine Mix Negative    26. Walnut, Black Pollen Negative    27. Red Mulberry Negative    28. Ash Mix Negative    29. Birch Mix Negative    30. Beech American Negative    31. Cottonwood, Eastern Negative    32. Hickory, White Negative    33. Maple Mix Negative    34. Oak, Eastern Mix Negative    35. Sycamore Eastern Negative    36. Alternaria Alternata Negative    37. Cladosporium Herbarum Negative    38. Aspergillus Mix Negative    39. Penicillium Mix Negative    40. Bipolaris Sorokiniana (Helminthosporium) Negative    41. Drechslera Spicifera (Curvularia) Negative    42. Mucor Plumbeus Negative    43. Fusarium Moniliforme Negative    44. Aureobasidium Pullulans (pullulara)  Negative    45. Rhizopus Oryzae Negative    46. Botrytis Cinera Negative    47. Epicoccum Nigrum Negative    48. Phoma Betae Negative    49. Dust Mite Mix Negative    50. Cat Hair 10,000 BAU/ml Negative    51.  Dog Epithelia Negative    52. Mixed Feathers Negative    53. Horse Epithelia Negative    54. Cockroach, German Negative    55. Tobacco Leaf Negative          13 Food Perc - 04/12/24 1439       Test Information   Time Antigen Placed 1439    Allergen Manufacturer Jestine    Location Back    Number of allergen test 13      Food   1. Peanut Negative    2. Soybean Negative    3. Wheat Negative    4. Sesame Negative    5. Milk, Cow Negative    6. Casein Negative    7. Egg White, Chicken Negative    8. Shellfish Mix Negative    9. Fish Mix Negative    10. Cashew Negative  11. Walnut Food Negative    12. Almond Negative    13. Hazelnut Negative          Allergy testing results were read and interpreted by myself, documented by clinical staff.  She also received sample of xolair 300mg  in clinic today.  Will plan on continue xolair 300mg  every 4 weeks for chronic hives   Patient provided with copy of allergy testing along with avoidance measures when indicated.   Hargis Springer, MD  [facility] 

## 2024-04-16 ENCOUNTER — Encounter: Payer: Self-pay | Admitting: Cardiology

## 2024-04-16 ENCOUNTER — Ambulatory Visit: Attending: Cardiology | Admitting: Cardiology

## 2024-04-16 ENCOUNTER — Encounter: Payer: Self-pay | Admitting: Family Medicine

## 2024-04-16 VITALS — BP 101/65 | HR 94 | Ht 68.0 in | Wt 219.0 lb

## 2024-04-16 DIAGNOSIS — R55 Syncope and collapse: Secondary | ICD-10-CM

## 2024-04-16 DIAGNOSIS — Q8740 Marfan's syndrome, unspecified: Secondary | ICD-10-CM

## 2024-04-16 DIAGNOSIS — L700 Acne vulgaris: Secondary | ICD-10-CM

## 2024-04-16 DIAGNOSIS — E7849 Other hyperlipidemia: Secondary | ICD-10-CM | POA: Diagnosis not present

## 2024-04-16 NOTE — Patient Instructions (Addendum)
Medication Instructions:  No changes  *If you need a refill on your cardiac medications before your next appointment, please call your pharmacy*   Lab Work: Not needed If you have labs (blood work) drawn today and your tests are completely normal, you will receive your results only by: . MyChart Message (if you have MyChart) OR . A paper copy in the mail If you have any lab test that is abnormal or we need to change your treatment, we will call you to review the results.   Testing/Procedures:  Not needed  Follow-Up: At [facility], you and your health needs are our priority.  As part of our continuing mission to provide you with exceptional heart care, we have created designated Provider Care Teams.  These Care Teams include your primary Cardiologist (physician) and Advanced Practice Providers (APPs -  Physician Assistants and Nurse Practitioners) who all work together to provide you with the care you need, when you need it.   Your next appointment:   12 month(s)  The format for your next appointment:   In Person  Provider:   David Harding, MD  

## 2024-04-16 NOTE — Progress Notes (Signed)
 Cardiology Office Note:  .   Date:  04/20/2024  ID:  Heather Stewart, DOB 03/12/1986, MRN [id number] PCP: Rilla Baller, MD  [facility] Providers Cardiologist:  Alm Clay, MD     Chief Complaint  Patient presents with   Follow-up    Annual follow-up to discuss test.  Doing well.    Patient Profile: .     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  PMH:  Marfan's (connective tissue disease) disease,  OSA (not really tolerating nasal pillow CPAP)  ADD,  migraines  insomnia   Heather Stewart was originally followed by Dr. Hobart, she then saw Dr. Arnetha in February 2022 for palpitations and syncope.  Zio patch monitor showed 4 beat run of NSVT and 2 PAT runs with rare isolated PACs and PVCs.  Echo essentially normal.  In her follow-up visit with Dr. Hobart she noted history of Marfan's.  (Ultimately had genetic testing and was positive for FBN1 (c.8491del (p.Ile2831Serfs*15)) as well as variants of uncertain significance in two genes: DCHS1 (c.8624G>A (p.Arg2875Gln)) and FOXE3 (c.880G>A (p.Ala294Thr). She is followed by Dr. Georgianna. ) => had some chest pain intermittently that radiated back and shoulder.  Mostly musculoskeletal.  Had not had a CT scan since 2009.  Was noting a couple episodes of tachycardia with heart rates up to the 120s to 140s. Checked a CTA AORTA  Plan will be to continue with MRA scans.  Echo updated. MRA chest ordered - ? For november     Heather Stewart was last seen on February 13, 2023.  She acknowledges that she is deconditioned and having some exertional dyspnea but but has lost about 36 pounds.  However since the [facility] no longer covers GLP-1 agonist for weight loss, she gained back to weight.  No active anginal symptoms.  No further syncope or near syncope.  Occasional heart racing but nothing  significant.  Plan was surveillance echocardiogram and MRA of the aorta.  Subjective  Discussed the use of AI scribe software for clinical note transcription with the patient, who gave verbal consent to proceed.  History of Present Illness Heather Stewart is a 38 year old female who presents with fatigue and shortness of breath.  She experienced fatigue and shortness of breath during a recent hiking trip, which was unusual for her. She felt achy, unable to catch her breath, and her legs were tired. After returning home, she slept extensively and felt better the following day. These symptoms did not prevent her from engaging in her usual activities, but they were concerning enough to mention.  She has a history of hives and polymorphic light eruption, for which she is currently on Xelera, a biologic medication. She experienced hives for six weeks in the past, resulting in scars from scratching. She avoids red meat due to a previous suspicion of alpha-gal syndrome, which was later ruled out. She continues to focus on a healthy diet, including consuming acai bowls and chicken instead of red meat.  Her family history is significant for coronary artery disease, with a second cousin undergoing a quadruple bypass and a grandfather who had a five-bypass surgery. She has elevated LDL cholesterol level at 136 mg/dL and is attempting to manage it through diet and lifestyle changes. She does not have diabetes, does not smoke, and her blood pressure is high.  She has undergone several cardiovascular evaluations, including a CTA angiogram of her aorta, an echocardiogram, and an MRI of her aorta. Mild pectus excavatum was noted.  She mentions experiencing back pain related to Marfan syndrome, which limits her ability to exercise. She finds walking to be uncomfortable and is considering alternative forms of exercise that are less stressful on her back, such as a seated stationary bike or chair yoga.  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 Reports fatigue and shortness of breath during physical exertion.     Objective   Medications: Adderall  25 mg daily, Klonopin  0.5 mg twice daily.  Cymbalta  60 mg daily.  Atarax  25 Miller daily, Lamictal  25 mg 2 tabs daily, Protonix  40 mg daily, Seroquel  50 mg 1 to 2 tablets nightly.  Albuterol  and Neffy  as needed  Studies Reviewed: SABRA   EKG Interpretation Date/Time:  Tuesday April 16 2024 08:22:12 EST Ventricular Rate:  94 PR Interval:  132 QRS Duration:  76 QT Interval:  344 QTC Calculation: 430 R Axis:   116  Text Interpretation: Normal sinus rhythm Left atrial enlargement Right axis deviation When compared with ECG of 13-Feb-2023 15:46, No significant change was found Confirmed by Anner Lenis ([id number]) on 04/16/2024 8:33:50 AM    Lab Results  Component Value Date   CHOL 210 (H) 01/08/2024   HDL 39.70 01/08/2024   LDLCALC 136 (H) 01/08/2024   LDLDIRECT 129.0 10/31/2018   TRIG 174.0 (H) 01/08/2024   CHOLHDL 5 01/08/2024   Lab Results  Component Value Date   NA 137 01/08/2024   K 4.1 01/08/2024   CREATININE 0.71 01/08/2024   GFR 108.63 01/08/2024   GLUCOSE 83 01/08/2024   No results found for: HGBA1C  MR Angiogram Chest w w/o Contrast: Aortic root 37 mm, sinotubular junction 29 mm, proximal ascending TA 28 mm at the level of PA and 29 mm at the largest size.  Aortic arch 23 mm, proximal descending TA 26 mm and distal descending TA 21 mm.  No evidence of thoracic aortic aneurysm or dissection.  Mild pectus excavatum.  Echocardiogram: EF 55 to 6%.  No RWMA.  Normal diastolic pressure.  Normal RV size and function.  Normal RAP and PAP.  Normal aortic and mitral valves.( 05/08/2023)  Previous Studies: CTA Aorta 04/02/2022: No evidence of aortic aneurysm, dissection or other abnormality.  No acute CP process.  Echocardiogram: Normal LV size and function.  EF 60 to 65%.  No RWMA.   Normal diastolic parameters.  Normal RV size and function.  Normal PAP and RAP.  Normal MV and AoV..  (05/05/22)   Zio Monitor 07/2020: Heart rate range 49 to 175 bpm and average 77 bpm.  Underlying rhythm was sinus rhythm.  1 run of 4 PVCs with a max rate of 160 bpm.  2 atrial runs of 5 beats with a max rate of 169 bpm.  Rare (< 1%) isolated PACs and PVCs. No sustained arrhythmias either tachycardic or bradycardic.  No evidence of complete heart block. => Triggered and diary events associated with sinus rhythm, sinus bradycardia,and sinus tachycardia.  Risk Assessment/Calculations:          Physical Exam:   VS:  BP 101/65 (BP Location: Left Arm, Patient Position: Sitting)   Pulse 94   Ht 5' 8 (1.727 m)   Wt 219 lb (99.3 kg)   SpO2 97%   BMI 33.30 kg/m    Wt Readings from Last 3 Encounters:  04/16/24 219 lb (99.3 kg)  03/29/24 218 lb 12.8 oz (99.2 kg)  03/07/24 216 lb 6.4 oz (98.2 kg)      GEN: Well nourished, well developed in no acute distress; mildy obese NECK: No JVD; No carotid bruits CARDIAC: Normal S1, S2; RRR, no murmurs, rubs, gallops RESPIRATORY:  Clear to auscultation without rales, wheezing or rhonchi ; nonlabored, good air movement. ABDOMEN: Soft, non-tender, non-distended EXTREMITIES:  No edema; No deformity      ASSESSMENT AND PLAN: .    Problem List Items Addressed This Visit       Cardiology Problems   Hyperlipidemia due to dietary fat intake (Chronic)   LDL cholesterol is elevated at 136 mg/dL. No coronary artery disease on imaging. Family history of coronary artery disease. Current management focuses on lifestyle modifications, including diet and exercise. Discussed potential need for medication if LDL remains elevated by age 30. - Continue dietary modifications to lower LDL cholesterol. - Encouraged regular exercise, considering low-impact options like seated stationary bike or chair yoga. - Reassess LDL levels in a few years.      Relevant Orders    EKG 12-Lead (Completed)     Other   Marfan syndrome (Chronic)   Marfan syndrome with stable aortic and cardiac imaging Marfan syndrome is well-managed with normal echocardiogram and MRI of the aorta. No aortic dilation or significant cardiac abnormalities. Mild pectus excavatum noted, not clinically significant. Family history of coronary artery disease, but current imaging shows no coronary artery disease. - Continue routine monitoring of aortic and cardiac status. - Consider future echocardiogram during pregnancy if planning to conceive.      Relevant Orders   EKG 12-Lead (Completed)   Syncope and collapse - Primary   No further episodes.  Continue to monitor.      Relevant Orders   EKG 12-Lead (Completed)    Noncardiac Assessment and Plan  Chronic urticaria and polymorphic light eruption Managed with Xelera, a biologic that helps with not having hives and polymorphic light eruption hives. Symptoms include hives triggered by sun exposure and previous episodes of hives lasting six weeks. - Continue Xelera for management of chronic urticaria and polymorphic light eruption.  Pectus excavatum Mild pectus excavatum noted on imaging, not clinically significant. - No immediate intervention required.      Follow-Up: Return in about 1 year (around 04/16/2025) for 1 Yr Follow-up, [employer].     Signed, Alm MICAEL Clay, MD, MS Alm Clay, M.D., M.S. Interventional Cardiologist  [facility] Pager # [phone]

## 2024-04-20 ENCOUNTER — Encounter: Payer: Self-pay | Admitting: Cardiology

## 2024-04-20 NOTE — Assessment & Plan Note (Signed)
 No further episodes.  Continue to monitor.

## 2024-04-20 NOTE — Assessment & Plan Note (Signed)
 Marfan syndrome with stable aortic and cardiac imaging Marfan syndrome is well-managed with normal echocardiogram and MRI of the aorta. No aortic dilation or significant cardiac abnormalities. Mild pectus excavatum noted, not clinically significant. Family history of coronary artery disease, but current imaging shows no coronary artery disease. - Continue routine monitoring of aortic and cardiac status. - Consider future echocardiogram during pregnancy if planning to conceive.

## 2024-04-20 NOTE — Assessment & Plan Note (Signed)
 LDL cholesterol is elevated at 136 mg/dL. No coronary artery disease on imaging. Family history of coronary artery disease. Current management focuses on lifestyle modifications, including diet and exercise. Discussed potential need for medication if LDL remains elevated by age 38. - Continue dietary modifications to lower LDL cholesterol. - Encouraged regular exercise, considering low-impact options like seated stationary bike or chair yoga. - Reassess LDL levels in a few years.

## 2024-04-22 ENCOUNTER — Encounter: Payer: Self-pay | Admitting: Internal Medicine

## 2024-04-22 ENCOUNTER — Other Ambulatory Visit: Payer: Self-pay

## 2024-04-22 ENCOUNTER — Encounter: Payer: Self-pay | Admitting: *Deleted

## 2024-04-22 ENCOUNTER — Other Ambulatory Visit (HOSPITAL_COMMUNITY): Payer: Self-pay

## 2024-04-22 MED ORDER — XOLAIR 300 MG/2ML ~~LOC~~ SOSY
300.0000 mg | PREFILLED_SYRINGE | SUBCUTANEOUS | 11 refills | Status: AC
  Filled 2024-04-22: qty 2, 28d supply, fill #0

## 2024-04-22 NOTE — Addendum Note (Signed)
 Addended by: OTHA MADELIN HERO on: 04/22/2024 11:26 AM   Modules accepted: Orders

## 2024-04-22 NOTE — Telephone Encounter (Signed)
 Spoke to patient and advised approval, copay card and submit to [facility] for XOlair . Will reach out once delivery is set to make appt for next dose. Patient did advise she has broken out with rash again and MD stated she may need biopsy if occurs again

## 2024-04-22 NOTE — Addendum Note (Signed)
 Addended by: RILLA BALLER on: 04/22/2024 06:08 PM   Modules accepted: Orders

## 2024-04-23 ENCOUNTER — Other Ambulatory Visit: Payer: Self-pay

## 2024-04-23 ENCOUNTER — Other Ambulatory Visit: Payer: Self-pay | Admitting: Pharmacist

## 2024-04-23 NOTE — Progress Notes (Signed)
 Called patient to schedule an appointment for the [facility]. I was unable to reach the patient so I left a HIPAA-compliant message requesting that the patient return my call.   Herlene Fleeta Morris, PharmD, JAQUELINE, CPP Clinical Pharmacist [facility] [phone]

## 2024-04-24 NOTE — Telephone Encounter (Signed)
 I would start high-dose antihistamines you can go up to 4 Zyrtec a day.  Monitor for sleepiness.  Hopefully Xolair  will start to take effect.

## 2024-04-30 ENCOUNTER — Other Ambulatory Visit (HOSPITAL_COMMUNITY): Payer: Self-pay

## 2024-04-30 MED ORDER — AMPHETAMINE-DEXTROAMPHET ER 25 MG PO CP24
25.0000 mg | ORAL_CAPSULE | Freq: Every morning | ORAL | 0 refills | Status: DC
  Filled 2024-04-30: qty 30, 30d supply, fill #0

## 2024-05-09 NOTE — Telephone Encounter (Signed)
 She does not have any active cardiac symptoms.  No reason for any further testing to be done.  Okay to proceed.  Alm Clay, MD

## 2024-05-09 NOTE — Telephone Encounter (Signed)
 Requesting office resent clearance request inquiring of pt has been cleared. Pt was recently seen by Dr. Anner 04/16/24. I will forward to preop APP to review if the pt has been cleared.

## 2024-05-10 NOTE — Telephone Encounter (Signed)
° °  Patient Name: Heather Stewart  DOB: 05-Aug-1985 MRN: [id number]  Primary Cardiologist: Alm Clay, MD  Chart reviewed as part of pre-operative protocol coverage. Given past medical history and time since last visit, based on ACC/AHA guidelines, [name] is at acceptable risk for the planned procedure without further cardiovascular testing.   I will route this recommendation to the requesting party via Epic fax function and remove from pre-op pool.  Please call with questions.  Lum LITTIE Louis, NP 05/10/2024, 7:34 AM

## 2024-05-17 ENCOUNTER — Ambulatory Visit: Admitting: Surgical

## 2024-05-21 ENCOUNTER — Other Ambulatory Visit: Payer: Self-pay

## 2024-05-21 ENCOUNTER — Other Ambulatory Visit (HOSPITAL_COMMUNITY): Payer: Self-pay

## 2024-05-21 DIAGNOSIS — F411 Generalized anxiety disorder: Secondary | ICD-10-CM | POA: Diagnosis not present

## 2024-05-21 DIAGNOSIS — F32A Depression, unspecified: Secondary | ICD-10-CM | POA: Diagnosis not present

## 2024-05-21 DIAGNOSIS — F900 Attention-deficit hyperactivity disorder, predominantly inattentive type: Secondary | ICD-10-CM | POA: Diagnosis not present

## 2024-05-21 MED ORDER — DULOXETINE HCL 30 MG PO CPEP
30.0000 mg | ORAL_CAPSULE | Freq: Every day | ORAL | 3 refills | Status: AC
  Filled 2024-05-21: qty 30, 30d supply, fill #0
  Filled 2024-06-25: qty 30, 30d supply, fill #1

## 2024-05-21 MED ORDER — AMPHETAMINE-DEXTROAMPHET ER 25 MG PO CP24
25.0000 mg | ORAL_CAPSULE | Freq: Every morning | ORAL | 0 refills | Status: AC
  Filled 2024-06-04: qty 30, 30d supply, fill #0

## 2024-05-21 MED ORDER — DULOXETINE HCL 60 MG PO CPEP: 60.0000 mg | ORAL_CAPSULE | Freq: Every day | ORAL | 0 refills | Status: AC

## 2024-05-21 MED ORDER — CLONAZEPAM 0.5 MG PO TABS
0.5000 mg | ORAL_TABLET | Freq: Two times a day (BID) | ORAL | 2 refills | Status: AC
  Filled 2024-05-21: qty 60, 30d supply, fill #0

## 2024-05-21 MED ORDER — CAPLYTA 21 MG PO CAPS
21.0000 mg | ORAL_CAPSULE | Freq: Every day | ORAL | 2 refills | Status: AC
  Filled 2024-05-21: qty 30, 30d supply, fill #0

## 2024-05-21 MED ORDER — LAMICTAL 25 MG PO TABS
50.0000 mg | ORAL_TABLET | Freq: Every day | ORAL | 0 refills | Status: AC
  Filled 2024-05-21: qty 180, 90d supply, fill #0

## 2024-05-31 ENCOUNTER — Other Ambulatory Visit: Payer: Self-pay

## 2024-05-31 DIAGNOSIS — L501 Idiopathic urticaria: Secondary | ICD-10-CM

## 2024-05-31 MED ORDER — NEFFY 2 MG/0.1ML NA SOLN: 1.0000 | Freq: Once | NASAL | 1 refills | Status: AC | PRN

## 2024-06-04 ENCOUNTER — Other Ambulatory Visit (HOSPITAL_COMMUNITY): Payer: Self-pay

## 2024-06-17 ENCOUNTER — Ambulatory Visit

## 2024-06-21 ENCOUNTER — Other Ambulatory Visit: Payer: Self-pay

## 2024-06-21 ENCOUNTER — Other Ambulatory Visit (HOSPITAL_COMMUNITY): Payer: Self-pay

## 2024-06-21 MED ORDER — CAPLYTA 42 MG PO CAPS
42.0000 mg | ORAL_CAPSULE | Freq: Every day | ORAL | 5 refills | Status: AC
  Filled 2024-06-21: qty 30, 30d supply, fill #0

## 2024-06-25 ENCOUNTER — Other Ambulatory Visit (HOSPITAL_COMMUNITY): Payer: Self-pay

## 2024-06-26 ENCOUNTER — Other Ambulatory Visit (HOSPITAL_COMMUNITY): Payer: Self-pay

## 2024-07-01 ENCOUNTER — Other Ambulatory Visit (HOSPITAL_COMMUNITY): Payer: Self-pay

## 2024-07-01 ENCOUNTER — Ambulatory Visit

## 2024-07-08 ENCOUNTER — Ambulatory Visit
